# Patient Record
Sex: Female | Born: 1950
Health system: Southern US, Community
[De-identification: ages and names within clinical notes are randomized; demographics above are authoritative.]

## PROBLEM LIST (undated history)

## (undated) DIAGNOSIS — C50912 Malignant neoplasm of unspecified site of left female breast: Secondary | ICD-10-CM

## (undated) DIAGNOSIS — Z923 Personal history of irradiation: Secondary | ICD-10-CM

## (undated) DIAGNOSIS — I10 Essential (primary) hypertension: Secondary | ICD-10-CM

## (undated) HISTORY — PX: BREAST LUMPECTOMY: SHX2

## (undated) HISTORY — DX: Essential (primary) hypertension: I10

## (undated) HISTORY — PX: BLEPHAROPLASTY: SUR158

---

## 2016-04-22 DIAGNOSIS — Z23 Encounter for immunization: Secondary | ICD-10-CM | POA: Diagnosis not present

## 2016-10-10 DIAGNOSIS — H40033 Anatomical narrow angle, bilateral: Secondary | ICD-10-CM | POA: Diagnosis not present

## 2016-10-10 DIAGNOSIS — G44219 Episodic tension-type headache, not intractable: Secondary | ICD-10-CM | POA: Diagnosis not present

## 2016-12-23 DIAGNOSIS — Z23 Encounter for immunization: Secondary | ICD-10-CM | POA: Diagnosis not present

## 2017-09-18 DIAGNOSIS — C50912 Malignant neoplasm of unspecified site of left female breast: Secondary | ICD-10-CM

## 2017-09-18 HISTORY — DX: Malignant neoplasm of unspecified site of left female breast: C50.912

## 2017-10-04 ENCOUNTER — Other Ambulatory Visit: Payer: Self-pay

## 2017-10-04 ENCOUNTER — Encounter: Payer: Self-pay | Admitting: Family Medicine

## 2017-10-04 ENCOUNTER — Other Ambulatory Visit: Payer: Self-pay | Admitting: Family Medicine

## 2017-10-04 ENCOUNTER — Ambulatory Visit (INDEPENDENT_AMBULATORY_CARE_PROVIDER_SITE_OTHER): Payer: Medicare Other | Admitting: Family Medicine

## 2017-10-04 VITALS — BP 150/88 | HR 73 | Temp 98.7°F | Ht 62.5 in | Wt 127.0 lb

## 2017-10-04 DIAGNOSIS — N814 Uterovaginal prolapse, unspecified: Secondary | ICD-10-CM

## 2017-10-04 DIAGNOSIS — H938X3 Other specified disorders of ear, bilateral: Secondary | ICD-10-CM

## 2017-10-04 DIAGNOSIS — N6320 Unspecified lump in the left breast, unspecified quadrant: Secondary | ICD-10-CM | POA: Diagnosis present

## 2017-10-04 MED ORDER — FLUTICASONE PROPIONATE 50 MCG/ACT NA SUSP: 2.0000 | Freq: Every day | NASAL | 6 refills | Status: DC

## 2017-10-04 NOTE — Progress Notes (Signed)
   CC: new patient  HPI  Lump in L breast few months ago. Never had something similar. 15 years ago was last mammo. No nipple discharge, not painful, no change in the last few months since discovering it. No unintentional weight loss. She has "read online and is sure it is benign."  Concerned with possible prolapse of something into her vagina, nothing has come all the way out. One pregnancy TAB.  No hx of surgery or fibroids. Menopause really without symptoms. No urinary symptoms. Some fullness and discomfort and felt "off." not sexually active for 15 years.   Exercises 4-5x per week  Referred by: medicare list  Medical history: nothing, but hasn't seen a doctor in 15 years  Surgical history: cosmetic surgery - rhinoplasty and bleph  Social history:  Lives with: alone Occupation: retired, taking classes at [employer] Tobacco use: never Alcohol use: 2-3 times per month 1 drink Drug use: never  ROS: Denies CP, SOB, abdominal pain, dysuria, changes in BMs.   CC, SH/smoking status, and VS noted  Objective: BP (!) 150/88   Pulse 73   Temp 98.7 F (37.1 C) (Oral)   Ht 5' 2.5" (1.588 m)   Wt 127 lb (57.6 kg)   LMP 03/21/2002   SpO2 98%   BMI 22.86 kg/m  Gen: NAD, alert, cooperative, and pleasant. HEENT: NCAT, EOMI, PERRL CV: RRR, no murmur Chest: Left breast with 3 x 4 cm hard nonmobile mass no overlying skin changes.  No axillary lymphadenopathy or nipple discharge. Resp: CTAB, no wheezes, non-labored Abd: SNTND, BS present, no guarding or organomegaly GU: Normal external female genitalia, cervix easily visualized approximately 3 cm inside the introitus no lesions. Ext: No edema, warm Neuro: Alert and oriented, Speech clear, No gross deficits  Assessment and plan:  Breast mass, left Concerning lesion for malignancy given length of presence, solid mass on exam.  Will get diagnostic mammogram as soon as possible.  Patient calls to schedule this today.  Uterine  prolapse Cervix is easily visualized near the vaginal introitus, patient may likely benefit from pessary.  Will refer to gynecology for assessment.  Ear fullness, bilateral Will trial Flonase, can also try antihistamine over-the-counter persistent.   Orders Placed This Encounter  Procedures  . MM DIAG BREAST TOMO BILATERAL    MCR//CO SIGN REQ 10/04/17 PF IN [place] 15 YEARS AGO-PT UNABLE TO OBTAIN//L breast mass 4cm x3cm solid, present for months//NO NEEDS/NO BREAST SX'S/SB W/PT    Standing Status:   Future    Standing Expiration Date:   12/06/2018    Order Specific Question:   Reason for Exam (SYMPTOM  OR DIAGNOSIS REQUIRED)    Answer:   L breast mass 4cm x3cm solid, present for months    Order Specific Question:   Preferred imaging location?    Answer:   [facility]  . Ambulatory referral to Gynecology    Referral Priority:   Routine    Referral Type:   Consultation    Referral Reason:   Specialty Services Required    Requested Specialty:   Gynecology    Number of Visits Requested:   1    Meds ordered this encounter  Medications  . fluticasone (FLONASE) 50 MCG/ACT nasal spray    Sig: Place 2 sprays into both nostrils daily.    Dispense:  16 g    Refill:  6     Ralene Ok, MD, PGY3 10/05/2017 3:08 PM

## 2017-10-04 NOTE — Patient Instructions (Signed)
It was a pleasure to see you today! Thank you for choosing [facility] for your primary care. Tammy Levine was seen for new patient, breast lump.   Our plans for today were:  Please call the Breast center at [phone] to schedule your mammogram  The gynecology office will call you.   Please try the flonase.   Please come back in 1 month to recheck your blood pressure.  Best,  Dr. Lindell Noe

## 2017-10-05 DIAGNOSIS — N814 Uterovaginal prolapse, unspecified: Secondary | ICD-10-CM | POA: Insufficient documentation

## 2017-10-05 DIAGNOSIS — H938X3 Other specified disorders of ear, bilateral: Secondary | ICD-10-CM | POA: Insufficient documentation

## 2017-10-05 DIAGNOSIS — N6320 Unspecified lump in the left breast, unspecified quadrant: Secondary | ICD-10-CM | POA: Insufficient documentation

## 2017-10-05 NOTE — Assessment & Plan Note (Signed)
Concerning lesion for malignancy given length of presence, solid mass on exam.  Will get diagnostic mammogram as soon as possible.  Patient calls to schedule this today.

## 2017-10-05 NOTE — Assessment & Plan Note (Signed)
Cervix is easily visualized near the vaginal introitus, patient may likely benefit from pessary.  Will refer to gynecology for assessment.

## 2017-10-05 NOTE — Assessment & Plan Note (Signed)
Will trial Flonase, can also try antihistamine over-the-counter persistent.

## 2017-10-09 ENCOUNTER — Ambulatory Visit
Admission: RE | Admit: 2017-10-09 | Discharge: 2017-10-09 | Disposition: A | Discharge status: Home / Self Care | Payer: Medicare Other | Source: Ambulatory Visit | Attending: Family Medicine | Admitting: Family Medicine

## 2017-10-09 ENCOUNTER — Other Ambulatory Visit: Payer: Self-pay | Admitting: Family Medicine

## 2017-10-09 DIAGNOSIS — N6320 Unspecified lump in the left breast, unspecified quadrant: Secondary | ICD-10-CM

## 2017-10-09 DIAGNOSIS — R922 Inconclusive mammogram: Secondary | ICD-10-CM | POA: Diagnosis not present

## 2017-10-09 DIAGNOSIS — N6322 Unspecified lump in the left breast, upper inner quadrant: Secondary | ICD-10-CM | POA: Diagnosis not present

## 2017-10-10 ENCOUNTER — Other Ambulatory Visit: Payer: Self-pay | Admitting: Family Medicine

## 2017-10-11 ENCOUNTER — Ambulatory Visit
Admission: RE | Admit: 2017-10-11 | Discharge: 2017-10-11 | Disposition: A | Discharge status: Home / Self Care | Payer: Medicare Other | Source: Ambulatory Visit | Attending: Family Medicine | Admitting: Family Medicine

## 2017-10-11 DIAGNOSIS — N6322 Unspecified lump in the left breast, upper inner quadrant: Secondary | ICD-10-CM | POA: Diagnosis not present

## 2017-10-11 DIAGNOSIS — N6320 Unspecified lump in the left breast, unspecified quadrant: Secondary | ICD-10-CM

## 2017-10-11 DIAGNOSIS — C50212 Malignant neoplasm of upper-inner quadrant of left female breast: Secondary | ICD-10-CM | POA: Diagnosis not present

## 2017-10-13 ENCOUNTER — Telehealth: Payer: Self-pay | Admitting: Hematology

## 2017-10-13 ENCOUNTER — Encounter: Payer: Self-pay | Admitting: *Deleted

## 2017-10-13 NOTE — Telephone Encounter (Signed)
Spoke with patient to confirm afternoon [facility] appointment for 7/31, packet emailed to patient

## 2017-10-17 ENCOUNTER — Other Ambulatory Visit: Payer: Self-pay | Admitting: *Deleted

## 2017-10-17 DIAGNOSIS — Z170 Estrogen receptor positive status [ER+]: Principal | ICD-10-CM

## 2017-10-17 DIAGNOSIS — C50212 Malignant neoplasm of upper-inner quadrant of left female breast: Secondary | ICD-10-CM

## 2017-10-18 ENCOUNTER — Encounter: Payer: Self-pay | Admitting: Hematology

## 2017-10-18 ENCOUNTER — Ambulatory Visit
Admission: RE | Admit: 2017-10-18 | Discharge: 2017-10-18 | Disposition: A | Discharge status: Home / Self Care | Payer: Medicare Other | Source: Ambulatory Visit | Attending: Radiation Oncology | Admitting: Radiation Oncology

## 2017-10-18 ENCOUNTER — Inpatient Hospital Stay: Payer: Medicare Other

## 2017-10-18 ENCOUNTER — Other Ambulatory Visit: Payer: Self-pay

## 2017-10-18 ENCOUNTER — Encounter: Payer: Self-pay | Admitting: General Practice

## 2017-10-18 ENCOUNTER — Ambulatory Visit: Payer: Medicare Other | Attending: General Surgery | Admitting: Physical Therapy

## 2017-10-18 ENCOUNTER — Encounter: Payer: Self-pay | Admitting: Physical Therapy

## 2017-10-18 ENCOUNTER — Ambulatory Visit: Payer: Self-pay | Admitting: General Surgery

## 2017-10-18 ENCOUNTER — Inpatient Hospital Stay: Payer: Medicare Other | Attending: Hematology | Admitting: Hematology

## 2017-10-18 VITALS — BP 156/96 | HR 90 | Temp 97.8°F | Resp 18 | Ht 62.5 in | Wt 122.2 lb

## 2017-10-18 DIAGNOSIS — Z170 Estrogen receptor positive status [ER+]: Secondary | ICD-10-CM | POA: Insufficient documentation

## 2017-10-18 DIAGNOSIS — C50212 Malignant neoplasm of upper-inner quadrant of left female breast: Secondary | ICD-10-CM

## 2017-10-18 DIAGNOSIS — F419 Anxiety disorder, unspecified: Secondary | ICD-10-CM | POA: Insufficient documentation

## 2017-10-18 DIAGNOSIS — R293 Abnormal posture: Secondary | ICD-10-CM | POA: Insufficient documentation

## 2017-10-18 DIAGNOSIS — Z803 Family history of malignant neoplasm of breast: Secondary | ICD-10-CM

## 2017-10-18 LAB — CMP (CANCER CENTER ONLY)
ALT: 14 U/L (ref 0–44)
AST: 18 U/L (ref 15–41)
Albumin: 4.3 g/dL (ref 3.5–5.0)
Alkaline Phosphatase: 85 U/L (ref 38–126)
Anion gap: 10 (ref 5–15)
BUN: 28 mg/dL — AB (ref 8–23)
CHLORIDE: 106 mmol/L (ref 98–111)
CO2: 26 mmol/L (ref 22–32)
CREATININE: 0.9 mg/dL (ref 0.44–1.00)
Calcium: 9.5 mg/dL (ref 8.9–10.3)
GFR, Est AFR Am: >60 mL/min (ref >60)
GFR, Estimated: >60 mL/min (ref >60)
Glucose, Bld: 164 mg/dL — ABNORMAL HIGH (ref 70–99)
POTASSIUM: 3.8 mmol/L (ref 3.5–5.1)
SODIUM: 142 mmol/L (ref 135–145)
Total Bilirubin: 0.4 mg/dL (ref 0.3–1.2)
Total Protein: 7.7 g/dL (ref 6.5–8.1)

## 2017-10-18 LAB — CBC WITH DIFFERENTIAL (CANCER CENTER ONLY)
Basophils Absolute: 0.1 10*3/uL (ref 0.0–0.1)
Basophils Relative: 1 %
EOS ABS: 0.1 10*3/uL (ref 0.0–0.5)
Eosinophils Relative: 2 %
HCT: 42.8 % (ref 34.8–46.6)
HEMOGLOBIN: 13.7 g/dL (ref 11.6–15.9)
LYMPHS ABS: 1.7 10*3/uL (ref 0.9–3.3)
LYMPHS PCT: 23 %
MCH: 27 pg (ref 25.1–34.0)
MCHC: 32 g/dL (ref 31.5–36.0)
MCV: 84.2 fL (ref 79.5–101.0)
MONOS PCT: 5 %
Monocytes Absolute: 0.4 10*3/uL (ref 0.1–0.9)
NEUTROS PCT: 69 %
Neutro Abs: 5.2 10*3/uL (ref 1.5–6.5)
Platelet Count: 299 10*3/uL (ref 145–400)
RBC: 5.08 MIL/uL (ref 3.70–5.45)
RDW: 13.9 % (ref 11.2–14.5)
WBC Count: 7.5 10*3/uL (ref 3.9–10.3)

## 2017-10-18 NOTE — Progress Notes (Signed)
Nutrition Assessment  Reason for Assessment:  Pt seen in Breast Clinic  ASSESSMENT:   67 year old female with new diagnosis of breast cancer.  Past medial history reviewed.    Patient anxious and worried about diagnosis appetite has not been normal over the last few weeks but patient has still been eating  Medications:  reviewed  Labs: reviewed  Anthropometrics:   Height: 62.5 inches Weight: 122 lb  BMI: 21   NUTRITION DIAGNOSIS: Food and nutrition related knowledge deficit related to new diagnosis of breast cancer as evidenced by no prior need for nutrition related information.  INTERVENTION:   Discussed and provided packet of information regarding nutritional tips for breast cancer patients.  Questions answered.  Teachback method used.  Contact information provided and patient knows to contact me with questions/concerns.    MONITORING, EVALUATION, and GOAL: Pt will consume a healthy plant based diet to maintain lean body mass throughout treatment.   Tammy Levine B. Zenia Resides, [place], [place] Registered Dietitian [phone] (pager)

## 2017-10-18 NOTE — Progress Notes (Signed)
Tammy Levine  [phone] Fax:([phone]) [place] Note   Patient Care Team: Sela Hilding, MD as PCP - General (Family Medicine) Jovita Kussmaul, MD as Consulting Physician (General Surgery) Truitt Merle, MD as Consulting Physician (Hematology) Gery Pray, MD as Consulting Physician (Radiation Oncology)   Date of Service:  10/18/2017  CHIEF COMPLAINTS/PURPOSE OF CONSULTATION:  Malignant neoplasm of upper-inner quadrant of left breast     Malignant neoplasm of upper-inner quadrant of left breast in female, estrogen receptor positive ([place])   10/09/2017 Mammogram    Diagnostic Mammogram 10/09/17 IMPRESSION: 3.2 x 3.0 x 2.0 cm mass with imaging features highly suspicious for malignancy in the 11 o'clock position of the left breast.      10/11/2017 Initial Biopsy    Diagnosis 10/11/17 Breast, left, needle core biopsy, upper inner 11 o'clock - INVASIVE DUCTAL CARCINOMA WITH PAPILLARY FEATURES, SEE COMMENT.       10/11/2017 Receptors her2    Estrogen Receptor: 100%, POSITIVE, STRONG STAINING INTENSITY Progesterone Receptor: 80%, POSITIVE, STRONG STAINING INTENSITY Proliferation Marker Ki67: 15% HER2 - NEGATIVE      10/12/2017 Cancer Staging    Staging form: Breast, AJCC 8th Edition - Clinical stage from 10/12/2017: Stage IB (cT2, cN0, cM0, G2, ER+, PR+, HER2-) - Signed by Truitt Merle, MD on 10/18/2017      10/17/2017 Initial Diagnosis    Malignant neoplasm of upper-inner quadrant of left breast in female, estrogen receptor positive ([place])       HISTORY OF PRESENTING ILLNESS:   Tammy Levine 67 y.o. female is a here because of newly diagnosed left breast cancer. The patient presents to the Breast Clinic today accompanied by herself   She notes she has not had insurance for a long time and has not been doing regular mammogram screenings in the past. She notes she palpated her mass 3 months ago but was not sure if it was cancerous. She  went to her PCP on 10/04/17  and then had a mammogram before biopsy which determined the lump to be cancer. She notes breast sensitivity with mass. She notes she did not pay attention to any size change of her breast mass. She notes she would mildly check her breast regularly. She notes she freaked out when she noticed the lump herself.   Today she denies change in appetite or weight, she is at baseline overall. She is very active with exercise. She notes 1 day of breast pain post biopsy and she took aleve and Unisom to her sleep for this. She notes she is anxious about her diagnosis as she did not think she would get cancer.  Socially she does not have children. She is a retired Secondary school teacher. She moved to [place] 3 years ago. She takes philosophy classes at [employer] for her leisure.   She notes no significant PMHx. She had cosmetic surgery on her right eye. She notes to having a history of regular periods. Her menopause was tolerable. Her mother had a lumpectomy but not sure what was removed. She is not sure what cancer her father had. Both of her parents passed from renal failure. 2 of her maternal half-cousins had breast cancer. She attempted smoking cigarettes but never a habit. She stopped smoking marijuana in 1990. She does not do recreational drugs. She drinks occasionally. She notes she only takes Flonase as needed.    GYN HISTORY   Menarchal: 14 LMP: 2004 at 67yo Contraceptive: For 1 year at 22-24yo HRT:  No  G1P0A1: 1 abortion at age 33yo.     MEDICAL HISTORY:  History reviewed. No pertinent past medical history.  SURGICAL HISTORY: Past Surgical History:  Procedure Laterality Date  . cosmetic eyelid surgery      SOCIAL HISTORY: Social History   Socioeconomic History  . Marital status: Single    Spouse name: Not on file  . Number of children: Not on file  . Years of education: Not on file  . Highest education level: Not on file  Occupational History  . Not on  file  Social Needs  . Financial resource strain: Not on file  . Food insecurity:    Worry: Not on file    Inability: Not on file  . Transportation needs:    Medical: Not on file    Non-medical: Not on file  Tobacco Use  . Smoking status: Never Smoker  . Smokeless tobacco: Never Used  Substance and Sexual Activity  . Alcohol use: Yes    Comment: 2-3 times a months   . Drug use: Yes    Types: Marijuana    Comment: stopped in 1990   . Sexual activity: Not on file  Lifestyle  . Physical activity:    Days per week: Not on file    Minutes per session: Not on file  . Stress: Not on file  Relationships  . Social connections:    Talks on phone: Not on file    Gets together: Not on file    Attends religious service: Not on file    Active member of club or organization: Not on file    Attends meetings of clubs or organizations: Not on file    Relationship status: Not on file  . Intimate partner violence:    Fear of current or ex partner: Not on file    Emotionally abused: Not on file    Physically abused: Not on file    Forced sexual activity: Not on file  Other Topics Concern  . Not on file  Social History Narrative  . Not on file    FAMILY HISTORY: Family History  Problem Relation Age of Onset  . Cancer Mother        breast cancer?   . Chronic Renal Failure Mother   . Chronic Renal Failure Father   . Breast cancer Paternal Grandfather   . Breast cancer Cousin     ALLERGIES:  has No Known Allergies.  MEDICATIONS:  Current Outpatient Medications  Medication Sig Dispense Refill  . fluticasone (FLONASE) 50 MCG/ACT nasal spray Place 2 sprays into both nostrils daily. 16 g 6   No current facility-administered medications for this visit.     REVIEW OF SYSTEMS:   Constitutional: Denies fevers, chills or abnormal night sweats Eyes: Denies blurriness of vision, double vision or watery eyes Ears, nose, mouth, throat, and face: Denies mucositis or sore  throat Respiratory: Denies cough, dyspnea or wheezes Cardiovascular: Denies palpitation, chest discomfort or lower extremity swelling Gastrointestinal:  Denies nausea, heartburn or change in bowel habits Skin: Denies abnormal skin rashes Lymphatics: Denies new lymphadenopathy or easy bruising Neurological:Denies numbness, tingling or new weaknesses Behavioral/Psych: Mood is stable, no new changes  (+) anxious BREAST: (+) Palpable left breast mass with sensitivity All other systems were reviewed with the patient and are negative.  PHYSICAL EXAMINATION: ECOG PERFORMANCE STATUS: 0 - Asymptomatic  Vitals:   10/18/17 1301  BP: (!) 156/96  Pulse: 90  Resp: 18  Temp: 97.8 F (36.6 C)  SpO2: 99%   Filed Weights   10/18/17 1301  Weight: 122 lb 3.2 oz (55.4 kg)    GENERAL:alert, no distress and comfortable SKIN: skin color, texture, turgor are normal, no rashes or significant lesions EYES: normal, conjunctiva are pink and non-injected, sclera clear OROPHARYNX:no exudate, no erythema and lips, buccal mucosa, and tongue normal  NECK: supple, thyroid normal size, non-tender, without nodularity LYMPH:  no palpable lymphadenopathy in the cervical, axillary or inguinal LUNGS: clear to auscultation and percussion with normal breathing effort HEART: regular rate & rhythm and no murmurs and no lower extremity edema ABDOMEN:abdomen soft, non-tender and normal bowel sounds Musculoskeletal:no cyanosis of digits and no clubbing  PSYCH: alert & oriented x 3 with fluent speech NEURO: no focal motor/sensory deficits BREAST: (+) skin ecchymosis at biopsy site (+) 3.5 x 3 cm mass, nontender in left breast. Right breast exam benign, no other palpable mass or adenopathy.   LABORATORY DATA:  I have reviewed the data as listed CBC Latest Ref Rng & Units 10/18/2017  WBC 3.9 - 10.3 K/uL 7.5  Hemoglobin 11.6 - 15.9 g/dL 13.7  Hematocrit 34.8 - 46.6 % 42.8  Platelets 145 - 400 K/uL 299    CMP Latest  Ref Rng & Units 10/18/2017  Glucose 70 - 99 mg/dL 164(H)  BUN 8 - 23 mg/dL 28(H)  Creatinine 0.44 - 1.00 mg/dL 0.90  Sodium 135 - 145 mmol/L 142  Potassium 3.5 - 5.1 mmol/L 3.8  Chloride 98 - 111 mmol/L 106  CO2 22 - 32 mmol/L 26  Calcium 8.9 - 10.3 mg/dL 9.5  Total Protein 6.5 - 8.1 g/dL 7.7  Total Bilirubin 0.3 - 1.2 mg/dL 0.4  Alkaline Phos 38 - 126 U/L 85  AST 15 - 41 U/L 18  ALT 0 - 44 U/L 14    PATHOLOGY  Diagnosis 10/11/17 Breast, left, needle core biopsy, upper inner 11 o'clock - INVASIVE DUCTAL CARCINOMA WITH PAPILLARY FEATURES, SEE COMMENT. Microscopic Comment The carcinoma appears grade 2. Prognostic markers will be ordered. Dr. Lyndon Code has reviewed the case. The case was called to [facility] on 10/12/2017. Results: IMMUNOHISTOCHEMICAL AND MORPHOMETRIC ANALYSIS PERFORMED MANUALLY Estrogen Receptor: 100%, POSITIVE, STRONG STAINING INTENSITY Progesterone Receptor: 80%, POSITIVE, STRONG STAINING INTENSITY Proliferation Marker Ki67: 15% REFERENCE RANGE ESTROGEN RECEPTOR NEGATIVE 0% POSITIVE =>1% REFERENCE RANGE PROGESTERONE RECEPTOR NEGATIVE 0% POSITIVE =>1% All controls stained appropriately Thressa Sheller MD Pathologist, Electronic Signature ( Signed 10/17/2017) FLUORESCENCE IN-SITU HYBRIDIZATION Results: HER2 - NEGATIVE RATIO OF HER2/CEP17 SIGNALS 1.32 AVERAGE HER2 COPY NUMBER PER CELL 1.85     RADIOGRAPHIC STUDIES: I have personally reviewed the radiological images as listed and agreed with the findings in the report. US Breast Ltd Uni Left Inc Axilla  Result Date: 10/09/2017 CLINICAL DATA:  Mass felt by the patient in the upper left breast for the past 3 months. She reports no change in size during that time. Her last mammogram was 15 years ago and is not available for comparison. EXAM: DIGITAL DIAGNOSTIC BILATERAL MAMMOGRAM WITH CAD AND TOMO ULTRASOUND LEFT BREAST COMPARISON:  None. ACR Breast Density Category c: The breast tissue is  heterogeneously dense, which may obscure small masses. FINDINGS: There is an irregular, circumscribed mass in the upper, slightly inner left breast with some associated architectural distortion. This corresponds to the palpable mass, marked with a metallic marker and contains a small number of tiny, punctate, rounded and oval calcifications. No findings elsewhere in either breast suspicious for malignancy. No visible enlarged left axillary lymph  nodes. Mammographic images were processed with CAD. On physical exam, the patient has an approximately 4 x 3 cm firm, mobile, palpable mass in the 11 o'clock position of the left breast, 5 cm from the nipple. There are no palpable left axillary lymph nodes. Targeted ultrasound is performed, showing a 3.2 x 3.0 x 2.0 cm irregular hypoechoic mass ill-defined surrounding increased echogenicity in the 11 o'clock position of the left breast, 5 cm from the nipple. Ultrasound of the left axilla demonstrated normal appearing left axillary lymph nodes. IMPRESSION: 3.2 cm mass with imaging features highly suspicious for malignancy in the 11 o'clock position of the left breast. RECOMMENDATION: Ultrasound-guided core needle biopsy of the 3.2 cm mass in the 11 o'clock position of the left breast. This has been discussed with the patient and scheduled at 12:45 p.m. on 10/11/2017. I have discussed the findings and recommendations with the patient. Results were also provided in writing at the conclusion of the visit. If applicable, a reminder letter will be sent to the patient regarding the next appointment. BI-RADS CATEGORY  5: Highly suggestive of malignancy. Electronically Signed   By: Claudie Revering M.D.   On: 10/09/2017 12:41   Mm Diag Breast Tomo Bilateral  Result Date: 10/09/2017 CLINICAL DATA:  Mass felt by the patient in the upper left breast for the past 3 months. She reports no change in size during that time. Her last mammogram was 15 years ago and is not available for  comparison. EXAM: DIGITAL DIAGNOSTIC BILATERAL MAMMOGRAM WITH CAD AND TOMO ULTRASOUND LEFT BREAST COMPARISON:  None. ACR Breast Density Category c: The breast tissue is heterogeneously dense, which may obscure small masses. FINDINGS: There is an irregular, circumscribed mass in the upper, slightly inner left breast with some associated architectural distortion. This corresponds to the palpable mass, marked with a metallic marker and contains a small number of tiny, punctate, rounded and oval calcifications. No findings elsewhere in either breast suspicious for malignancy. No visible enlarged left axillary lymph nodes. Mammographic images were processed with CAD. On physical exam, the patient has an approximately 4 x 3 cm firm, mobile, palpable mass in the 11 o'clock position of the left breast, 5 cm from the nipple. There are no palpable left axillary lymph nodes. Targeted ultrasound is performed, showing a 3.2 x 3.0 x 2.0 cm irregular hypoechoic mass ill-defined surrounding increased echogenicity in the 11 o'clock position of the left breast, 5 cm from the nipple. Ultrasound of the left axilla demonstrated normal appearing left axillary lymph nodes. IMPRESSION: 3.2 cm mass with imaging features highly suspicious for malignancy in the 11 o'clock position of the left breast. RECOMMENDATION: Ultrasound-guided core needle biopsy of the 3.2 cm mass in the 11 o'clock position of the left breast. This has been discussed with the patient and scheduled at 12:45 p.m. on 10/11/2017. I have discussed the findings and recommendations with the patient. Results were also provided in writing at the conclusion of the visit. If applicable, a reminder letter will be sent to the patient regarding the next appointment. BI-RADS CATEGORY  5: Highly suggestive of malignancy. Electronically Signed   By: Claudie Revering M.D.   On: 10/09/2017 12:41   Mm Clip Placement Left  Result Date: 10/11/2017 CLINICAL DATA:  Evaluate clip placement  following ultrasound-guided LEFT breast biopsy. EXAM: DIAGNOSTIC LEFT MAMMOGRAM POST ULTRASOUND BIOPSY COMPARISON:  Previous exam(s). FINDINGS: Mammographic images were obtained following ultrasound guided biopsy of the 3.2 cm mass at the 11 o'clock position of the LEFT breast.  The RIBBON shaped clip is in satisfactory position. IMPRESSION: Satisfactory RIBBON clip position following ultrasound-guided LEFT breast biopsy. Final Assessment: Post Procedure Mammograms for Marker Placement Electronically Signed   By: Margarette Canada M.D.   On: 10/11/2017 14:01   [name] Lt Breast Bx W Loc Dev 1st Lesion Img Bx Spec US Guide  Addendum Date: 10/12/2017   ADDENDUM REPORT: 10/12/2017 14:35 ADDENDUM: Pathology revealed GRADE II INVASIVE DUCTAL CARCINOMA WITH PAPILLARY FEATURES of the Left breast, upper inner 11 o'clock. This was found to be concordant by Dr. Hassan Rowan. Pathology results were discussed with the patient by telephone. The patient reported doing well after the biopsy with tenderness at the site. Post biopsy instructions and care were reviewed and questions were answered. The patient was encouraged to call [facility] for any additional concerns. The patient was referred to [facility] on October 18, 2017. Pathology results reported by Terie Purser, RN on 10/12/2017. Electronically Signed   By: Margarette Canada M.D.   On: 10/12/2017 14:35   Result Date: 10/12/2017 CLINICAL DATA:  67 year old female for tissue sampling of UPPER INNER LEFT breast mass. EXAM: ULTRASOUND GUIDED LEFT BREAST CORE NEEDLE BIOPSY COMPARISON:  Previous exam(s). FINDINGS: I met with the patient and we discussed the procedure of ultrasound-guided biopsy, including benefits and alternatives. We discussed the high likelihood of a successful procedure. We discussed the risks of the procedure, including infection, bleeding, tissue injury, clip migration, and  inadequate sampling. Informed written consent was given. The usual time-out protocol was performed immediately prior to the procedure. Using sterile technique and 1% Lidocaine as local anesthetic, under direct ultrasound visualization, a 12 gauge spring-loaded device was used to perform biopsy of the 3.2 cm mass at the 11 o'clock position of the LEFT breast using a MEDIAL approach. At the conclusion of the procedure a RIBBON shaped tissue marker clip was deployed into the biopsy cavity. Follow up 2 view mammogram was performed and dictated separately. IMPRESSION: Ultrasound guided biopsy of 3.2 cm UPPER INNER LEFT breast mass. No apparent complications. Electronically Signed: By: Margarette Canada M.D. On: 10/11/2017 13:49    ASSESSMENT & PLAN:  Tammy Levine is a 67 y.o. Caucasian female with no significant PMHx.    1. Malignant neoplasm of upper-inner quadrant of left breast, Stage I, c (T2N0M0 ), ER/PR: (+), HER2 (-), Grade II -We discussed her image findings and the biopsy results in great details. She has grade II invasive ductal carcinoma.  -Giving the early stage disease, she is likely a candidate for lumpectomy with SLNB. She is agreeable with that. She was seen by Dr. Marlou Starks today and likely will proceed with surgery soon (in about 2 weeks).  -I recommend a Oncotype Dx test on the surgical sample and we'll make a decision about adjuvant chemotherapy based on the Oncotype result. Written material of this test was given to her. She is older but fit and in good overall health and would be a good candidate for chemotherapy if her Oncotype recurrence score is high. -If her surgical sentinel lymph node positive, I recommend mammaprint for further risk stratification and guide adjuvant chemotherapy. -The risk of recurrence depends on the stage and biology of the tumor. She is stage I, with ER/PR positive and HER2 negative markers. I discussed this is the more common type of slow growing tumor.  -She was also  seen by radiation oncologist Dr. Sondra Come today. If her surgical  sentinel lymph nodes were negative, she would not need post mastectomy radiation. Otherwise radiation is recommended to reduce the risk for local recurrence.  -Giving the strong ER and PR expression in her postmenopausal status, I recommend adjuvant endocrine therapy with aromatase inhibitor for a total of 5-10 years to reduce the risk of cancer recurrence.  Potential side effect of aromatase inhibitor was discussed with her in details. -We also discussed the breast cancer surveillance after her surgery. She will continue annual screening mammogram, self exam, and a routine office visit with lab and exam with [name]. -I encouraged her to maintain healthy diet and continue to exercise regularly.  -Given her anxiety with diagnosis, I offered her the chance to speak with our social workers. She declined at this time.   -Labs reviewed, CBC and CMP is WNL except glucose 164 and BUN at 28.     2. Elevated BP and hyperglycemia -Her BP has been elevated lately, at 156/96 today (10/18/17) with random BG at 164. -I suggest she check BP at home when she is more relaxed -I suggest her to f/u with PCP and test for HbA1c.    PLAN:  -she will proceed with lumpectomy and SLN by Dr. Marlou Starks in a few weeks -Oncotype on surgical sample, or mammaprint if node positive  -f/u after surgery or radiation based on Oncotype result    No orders of the defined types were placed in this encounter.   All questions were answered. The patient knows to call the clinic with any problems, questions or concerns. I spent 55 minutes counseling the patient face to face. The total time spent in the appointment was 60 minutes and more than 50% was on counseling.     Truitt Merle, MD 10/18/2017 6:23 PM  I, Tammy Levine, am acting as scribe for Truitt Merle, MD.   I have reviewed the above documentation for accuracy and completeness, and I agree with the above.

## 2017-10-18 NOTE — Progress Notes (Signed)
Radiation Oncology         [phone] ________________________________  Multidisciplinary Breast Oncology Clinic [facility]) Initial Outpatient Consultation  Name: Tammy Levine MRN: [id number]  Date: 10/18/2017  DOB: 1950/07/10  [id number], Curt Bears, MD  Jovita Kussmaul, MD   REFERRING PHYSICIAN: Autumn Messing III, MD  DIAGNOSIS: Stage cT2, cN0, cMo, Left Breast, UIQ, Invasive Ductal Carcinoma, ER (+), PR (+), HER2 (neg), grade 2  Cancer Staging Malignant neoplasm of upper-inner quadrant of left breast in female, estrogen receptor positive ([place]) Staging form: Breast, AJCC 8th Edition - Clinical stage from 10/12/2017: Stage IB (cT2, cN0, cM0, G2, ER+, PR+, HER2-) - Signed by Truitt Merle, MD on 10/18/2017    HISTORY OF PRESENT ILLNESS::Tammy Levine is a 67 y.o. female who is presenting to the office today for evaluation of her newly diagnosed breast cancer. She initially presented with a lump to her left breast x 3 months.   She underwent bilateral diagnostic mammography with tomography and left breast ultrasonography at [facility] on 10/09/2017 showing: 3.2 cm mass with imaging features highly suspicious for malignancy in the 11 o'clock position of the left breast.  Accordingly on 10/11/2017 she proceeded to biopsy of the left breast area in question. The pathology from this procedure showed: Breast, left, needle core biopsy, upper inner 11 o'clock with invasive ductal carcinoma with papillary features. Prognostic indicators significant for: estrogen receptor, 100% positive and progesterone receptor, 80% positive, both with strong staining intensity. Proliferation marker Ki67 at 15%. HER2 negative.  On review of systems, She denies any other symptoms.     Menarche: 67 years old Age at first live birth:  GP: GxP0 LMP: Patient's last menstrual period was 03/21/2002. Contraceptive: Yes, from 1975-1976 HRT: No   The patient was referred today for presentation in the  multidisciplinary conference.  Radiology studies and pathology slides were presented there for review and discussion of treatment options.  A consensus was discussed regarding potential next steps.  PREVIOUS RADIATION THERAPY: No  PAST MEDICAL HISTORY:  has no past medical history on file.    PAST SURGICAL HISTORY: Past Surgical History:  Procedure Laterality Date  . cosmetic eyelid surgery      FAMILY HISTORY: family history includes Breast cancer in her cousin and paternal grandfather; Cancer in her mother; Chronic Renal Failure in her father and mother.  SOCIAL HISTORY:  reports that she has never smoked. She has never used smokeless tobacco. She reports that she drinks alcohol. She reports that she has current or past drug history. Drug: Marijuana.  ALLERGIES: Patient has no known allergies.  MEDICATIONS:  Current Outpatient Medications  Medication Sig Dispense Refill  . fluticasone (FLONASE) 50 MCG/ACT nasal spray Place 2 sprays into both nostrils daily. 16 g 6   No current facility-administered medications for this encounter.     REVIEW OF SYSTEMS:  REVIEW OF SYSTEMS: A 10+ POINT REVIEW OF SYSTEMS WAS OBTAINED including neurology, dermatology, psychiatry, cardiac, respiratory, lymph, extremities, GI, GU, musculoskeletal, constitutional, reproductive, HEENT. All pertinent positives are noted in the HPI. All others are negative.   PHYSICAL EXAM:  Vitals with BMI 10/18/2017  Height 5' 2.5"  Weight 122 lbs 3 oz  BMI 77.11  Systolic 657  Diastolic 96  Pulse 90  Respirations 18    General: Alert and oriented, in no acute distress HEENT: Head is normocephalic. Extraocular movements are intact. Oropharynx is clear. Neck: Neck is supple, no palpable cervical or supraclavicular lymphadenopathy. Heart: Regular in rate and rhythm with no murmurs,  rubs, or gallops. Chest: Clear to auscultation bilaterally, with no rhonchi, wheezes, or rales. Abdomen: Soft, nontender, nondistended,  with no rigidity or guarding. Extremities: No cyanosis or edema. Lymphatics: see Neck Exam Skin: No concerning lesions. Musculoskeletal: symmetric strength and muscle tone throughout. Neurologic: Cranial nerves II through XII are grossly intact. No obvious focalities. Speech is fluent. Coordination is intact. Psychiatric: Judgment and insight are intact. Affect is appropriate. Breast: Right breast with no palpable mass, nipple discharge, or bleeding. Left breast with a significant amount of bruising in the UIQ. Dominant mass measuring approximately 3.5 x 3 cm. No skin involvement or chest wall involvement.     KPS = 100  100 - Normal; no complaints; no evidence of disease. 90   - Able to carry on normal activity; minor signs or symptoms of disease. 80   - Normal activity with effort; some signs or symptoms of disease. 70   - Cares for self; unable to carry on normal activity or to do active work. 60   - Requires occasional assistance, but is able to care for most of his personal needs. 50   - Requires considerable assistance and frequent medical care. 33   - Disabled; requires special care and assistance. 27   - Severely disabled; hospital admission is indicated although death not imminent. 55   - Very sick; hospital admission necessary; active supportive treatment necessary. 10   - Moribund; fatal processes progressing rapidly. 0     - Dead  Karnofsky DA, Abelmann [place], Craver LS and Burchenal [facility] [phone]) The use of the nitrogen mustards in the palliative treatment of carcinoma: with particular reference to bronchogenic carcinoma Cancer 1 634-56  LABORATORY DATA:  Lab Results  Component Value Date   WBC 7.5 10/18/2017   HGB 13.7 10/18/2017   HCT 42.8 10/18/2017   MCV 84.2 10/18/2017   PLT 299 10/18/2017   Lab Results  Component Value Date   NA 142 10/18/2017   K 3.8 10/18/2017   CL 106 10/18/2017   CO2 26 10/18/2017   Lab Results  Component Value Date   ALT 14 10/18/2017    AST 18 10/18/2017   ALKPHOS 85 10/18/2017   BILITOT 0.4 10/18/2017    RADIOGRAPHY: US Breast Ltd Uni Left Inc Axilla  Result Date: 10/09/2017 CLINICAL DATA:  Mass felt by the patient in the upper left breast for the past 3 months. She reports no change in size during that time. Her last mammogram was 15 years ago and is not available for comparison. EXAM: DIGITAL DIAGNOSTIC BILATERAL MAMMOGRAM WITH CAD AND TOMO ULTRASOUND LEFT BREAST COMPARISON:  None. ACR Breast Density Category c: The breast tissue is heterogeneously dense, which may obscure small masses. FINDINGS: There is an irregular, circumscribed mass in the upper, slightly inner left breast with some associated architectural distortion. This corresponds to the palpable mass, marked with a metallic marker and contains a small number of tiny, punctate, rounded and oval calcifications. No findings elsewhere in either breast suspicious for malignancy. No visible enlarged left axillary lymph nodes. Mammographic images were processed with CAD. On physical exam, the patient has an approximately 4 x 3 cm firm, mobile, palpable mass in the 11 o'clock position of the left breast, 5 cm from the nipple. There are no palpable left axillary lymph nodes. Targeted ultrasound is performed, showing a 3.2 x 3.0 x 2.0 cm irregular hypoechoic mass ill-defined surrounding increased echogenicity in the 11 o'clock position of the left breast, 5 cm from the nipple.  Ultrasound of the left axilla demonstrated normal appearing left axillary lymph nodes. IMPRESSION: 3.2 cm mass with imaging features highly suspicious for malignancy in the 11 o'clock position of the left breast. RECOMMENDATION: Ultrasound-guided core needle biopsy of the 3.2 cm mass in the 11 o'clock position of the left breast. This has been discussed with the patient and scheduled at 12:45 p.m. on 10/11/2017. I have discussed the findings and recommendations with the patient. Results were also provided in  writing at the conclusion of the visit. If applicable, a reminder letter will be sent to the patient regarding the next appointment. BI-RADS CATEGORY  5: Highly suggestive of malignancy. Electronically Signed   By: Claudie Revering M.D.   On: 10/09/2017 12:41   Mm Diag Breast Tomo Bilateral  Result Date: 10/09/2017 CLINICAL DATA:  Mass felt by the patient in the upper left breast for the past 3 months. She reports no change in size during that time. Her last mammogram was 15 years ago and is not available for comparison. EXAM: DIGITAL DIAGNOSTIC BILATERAL MAMMOGRAM WITH CAD AND TOMO ULTRASOUND LEFT BREAST COMPARISON:  None. ACR Breast Density Category c: The breast tissue is heterogeneously dense, which may obscure small masses. FINDINGS: There is an irregular, circumscribed mass in the upper, slightly inner left breast with some associated architectural distortion. This corresponds to the palpable mass, marked with a metallic marker and contains a small number of tiny, punctate, rounded and oval calcifications. No findings elsewhere in either breast suspicious for malignancy. No visible enlarged left axillary lymph nodes. Mammographic images were processed with CAD. On physical exam, the patient has an approximately 4 x 3 cm firm, mobile, palpable mass in the 11 o'clock position of the left breast, 5 cm from the nipple. There are no palpable left axillary lymph nodes. Targeted ultrasound is performed, showing a 3.2 x 3.0 x 2.0 cm irregular hypoechoic mass ill-defined surrounding increased echogenicity in the 11 o'clock position of the left breast, 5 cm from the nipple. Ultrasound of the left axilla demonstrated normal appearing left axillary lymph nodes. IMPRESSION: 3.2 cm mass with imaging features highly suspicious for malignancy in the 11 o'clock position of the left breast. RECOMMENDATION: Ultrasound-guided core needle biopsy of the 3.2 cm mass in the 11 o'clock position of the left breast. This has been  discussed with the patient and scheduled at 12:45 p.m. on 10/11/2017. I have discussed the findings and recommendations with the patient. Results were also provided in writing at the conclusion of the visit. If applicable, a reminder letter will be sent to the patient regarding the next appointment. BI-RADS CATEGORY  5: Highly suggestive of malignancy. Electronically Signed   By: Claudie Revering M.D.   On: 10/09/2017 12:41   Mm Clip Placement Left  Result Date: 10/11/2017 CLINICAL DATA:  Evaluate clip placement following ultrasound-guided LEFT breast biopsy. EXAM: DIAGNOSTIC LEFT MAMMOGRAM POST ULTRASOUND BIOPSY COMPARISON:  Previous exam(s). FINDINGS: Mammographic images were obtained following ultrasound guided biopsy of the 3.2 cm mass at the 11 o'clock position of the LEFT breast. The RIBBON shaped clip is in satisfactory position. IMPRESSION: Satisfactory RIBBON clip position following ultrasound-guided LEFT breast biopsy. Final Assessment: Post Procedure Mammograms for Marker Placement Electronically Signed   By: Margarette Canada M.D.   On: 10/11/2017 14:01   [name] Lt Breast Bx W Loc Dev 1st Lesion Img Bx Spec US Guide  Addendum Date: 10/12/2017   ADDENDUM REPORT: 10/12/2017 14:35 ADDENDUM: Pathology revealed GRADE II INVASIVE DUCTAL CARCINOMA WITH PAPILLARY FEATURES of  the Left breast, upper inner 11 o'clock. This was found to be concordant by Dr. Hassan Rowan. Pathology results were discussed with the patient by telephone. The patient reported doing well after the biopsy with tenderness at the site. Post biopsy instructions and care were reviewed and questions were answered. The patient was encouraged to call [facility] for any additional concerns. The patient was referred to [facility] at [facility] on October 18, 2017. Pathology results reported by Terie Purser, RN on 10/12/2017. Electronically Signed   By: Margarette Canada M.D.    On: 10/12/2017 14:35   Result Date: 10/12/2017 CLINICAL DATA:  67 year old female for tissue sampling of UPPER INNER LEFT breast mass. EXAM: ULTRASOUND GUIDED LEFT BREAST CORE NEEDLE BIOPSY COMPARISON:  Previous exam(s). FINDINGS: I met with the patient and we discussed the procedure of ultrasound-guided biopsy, including benefits and alternatives. We discussed the high likelihood of a successful procedure. We discussed the risks of the procedure, including infection, bleeding, tissue injury, clip migration, and inadequate sampling. Informed written consent was given. The usual time-out protocol was performed immediately prior to the procedure. Using sterile technique and 1% Lidocaine as local anesthetic, under direct ultrasound visualization, a 12 gauge spring-loaded device was used to perform biopsy of the 3.2 cm mass at the 11 o'clock position of the LEFT breast using a MEDIAL approach. At the conclusion of the procedure a RIBBON shaped tissue marker clip was deployed into the biopsy cavity. Follow up 2 view mammogram was performed and dictated separately. IMPRESSION: Ultrasound guided biopsy of 3.2 cm UPPER INNER LEFT breast mass. No apparent complications. Electronically Signed: By: Margarette Canada M.D. On: 10/11/2017 13:49      IMPRESSION: Stage cT2, cN0, cMo, Left Breast, UIQ, Invasive Ductal Carcinoma with papillary features, ER (+), PR (+), HER2 (neg), grade 2  Patient will be a good candidate for breast conservation. The patient has a large tumor in relation to their breast size, so Dr. Marlou Starks will make a decision on if she will be a candidate for lumpectomy with a good cosmetic results. Patient does wish to proceed with breast conserving surgery if this is possible, she would prefer to not have a mastectomy.   Today, I talked to the patient about the findings and work-up thus far. We discussed the natural history of breast cancer and general treatment, highlighting the role of radiotherapy in the  management. We discussed the available radiation techniques, and focused on the details of logistics and delivery. We reviewed the anticipated acute and late sequelae associated with radiation in this setting. The patient was encouraged to ask questions that I answered to the best of my ability.    PLAN:  1. Left lumpectomy and sentinel node biopsy pending Dr. Ethlyn Gallery evaluation 2. Oncotype Dx to determine the potential benefit of adjuvant chemotherapy.  3. Adjuvant radiation therapy as breast conservation treatment 4. Aromatase Inhibitor     ------------------------------------------------  Blair Promise, PhD, MD   This document serves as a record of services personally performed by Gery Pray, MD. It was created on his behalf by Steva Colder, a trained medical scribe. The creation of this record is based on the scribe's personal observations and the provider's statements to them. This document has been checked and approved by the attending provider.

## 2017-10-18 NOTE — Therapy (Signed)
[place], [place], [address] Phone: [phone]   Fax:  [phone]  Physical Therapy Evaluation  Patient Details  Name: Tammy Levine MRN: [id number] Date of Birth: [birth year] Referring Provider: Dr. Autumn Messing   Encounter Date: 10/18/2017  PT End of Session - 10/18/17 1600    Visit Number  1    Number of Visits  2    Date for PT Re-Evaluation  12/13/17    PT Start Time  3154    PT Stop Time  1440 Also saw pt from 1458-1510 for a total of 43 minutes    PT Time Calculation (min)  31 min    Activity Tolerance  Patient tolerated treatment well    Behavior During Therapy  [facility] for tasks assessed/performed       History reviewed. No pertinent past medical history.  Past Surgical History:  Procedure Laterality Date  . cosmetic eyelid surgery      There were no vitals filed for this visit.   Subjective Assessment - 10/18/17 1518    Subjective  Patient reports she is here today to be seen by her medical team for her newly diagnosed left breast cancer.    Pertinent History  Patient was diagnosed on 10/09/17 with left grade II invasive ductal carcinoma breast cancer. It measures 3.2 cm and is located in the upper inner quadrant. It is ER/PR positive and HER2 negative with a Ki67 of 15%. She has no other medical problems.    Patient Stated Goals  Reduce lymphedema risk and learn post op shoulder ROM HEP    Currently in Pain?  No/denies         [facility] PT Assessment - 10/18/17 0001      Assessment   Medical Diagnosis  Left breast cancer    Referring Provider  Dr. Autumn Messing    Onset Date/Surgical Date  10/09/17    Hand Dominance  Right    Prior Therapy  none      Precautions   Precautions  Other (comment)    Precaution Comments  active cancer      Restrictions   Weight Bearing Restrictions  No      Balance Screen   Has the patient fallen in the past 6 months  No    Has the patient had a  decrease in activity level because of a fear of falling?   No    Is the patient reluctant to leave their home because of a fear of falling?   No      Home Social worker  Private residence    Living Arrangements  Alone    Available Help at Discharge  Friend(s)      Prior Function   Level of Independence  Independent    Vocation  Retired    Biomedical scientist  Retired Environmental manager    Leisure  She goes to the gym 5x/week and does spinning, body pump, and a strength/cardio class      Cognition   Overall Cognitive Status  Within Functional Limits for tasks assessed      Posture/Postural Control   Posture/Postural Control  Postural limitations    Postural Limitations  Rounded Shoulders      ROM / Strength   AROM / PROM / Strength  AROM;Strength      AROM   AROM Assessment Site  Shoulder    Right/Left Shoulder  Right;Left    Right Shoulder Extension  45  Degrees    Right Shoulder Flexion  148 Degrees    Right Shoulder ABduction  169 Degrees    Right Shoulder Internal Rotation  48 Degrees    Right Shoulder External Rotation  87 Degrees    Left Shoulder Extension  48 Degrees    Left Shoulder Flexion  154 Degrees    Left Shoulder ABduction  171 Degrees    Left Shoulder Internal Rotation  54 Degrees    Left Shoulder External Rotation  90 Degrees      Strength   Overall Strength  Within functional limits for tasks performed        LYMPHEDEMA/ONCOLOGY QUESTIONNAIRE - 10/18/17 1524      Type   Cancer Type  Left breast cancer      Lymphedema Assessments   Lymphedema Assessments  Upper extremities      Right Upper Extremity Lymphedema   10 cm Proximal to Olecranon Process  27 cm    Olecranon Process  21.2 cm    10 cm Proximal to Ulnar Styloid Process  19.5 cm    Just Proximal to Ulnar Styloid Process  13.5 cm    Across Hand at [employer]  16.5 cm    At [place] of 2nd Digit  5.6 cm      Left Upper Extremity Lymphedema   10 cm Proximal to Olecranon Process   26.8 cm    Olecranon Process  22.5 cm    10 cm Proximal to Ulnar Styloid Process  18.6 cm    Just Proximal to Ulnar Styloid Process  13.3 cm    Across Hand at [employer]  16.1 cm    At [place] of 2nd Digit  5.3 cm             Objective measurements completed on examination: See above findings.     Patient was instructed today in a home exercise program today for post op shoulder range of motion. These included active assist shoulder flexion in sitting, scapular retraction, wall walking with shoulder abduction, and hands behind head external rotation.  She was encouraged to do these twice a day, holding 3 seconds and repeating 5 times when permitted by her physician.      PT Education - 10/18/17 1525    Education Details  Lymphedema risk reduction and post op shoulder ROM HEP    Person(s) Educated  Patient    Methods  Explanation;Demonstration;Handout    Comprehension  Verbalized understanding;Returned demonstration          PT Long Term Goals - 10/18/17 1607      PT LONG TERM GOAL #1   Title  Patient will demonstrate she has regained full function and shoulder ROM post operatively.    Time  [facility] Goals - 10/18/17 1605      Patient will be able to verbalize understanding of pertinent lymphedema risk reduction practices relevant to her diagnosis specifically related to skin care.   Time  1    Period  Days    Status  Achieved      Patient will be able to return demonstrate and/or verbalize understanding of the post-op home exercise program related to regaining shoulder range of motion.   Time  1    Period  Days    Status  Achieved      Patient will be able to verbalize understanding of  the importance of attending the postoperative After Breast Cancer Class for further lymphedema risk reduction education and therapeutic exercise.   Time  1    Period  Days    Status  Achieved            Plan - 10/18/17  1601    Clinical Impression Statement  Patient was diagnosed on 10/09/17 with left grade II invasive ductal carcinoma breast cancer. It measures 3.2 cm and is located in the upper inner quadrant. It is ER/PR positive and HER2 negative with a Ki67 of 15%. She has no other medical problems. Her multidisciplinary medical team met prior to her assessments to determine a recommended treatment plan. She is planning to have a left lumpectomy and sentinel node biopsy followed by Oncotype testing, radiation, and anti-estrogen therapy. She will benefit from post op PT to determine needs.    History and Personal Factors relevant to plan of care:  Lives alone    Clinical Presentation  Stable    Clinical Decision Making  Low    Rehab Potential  Excellent    Clinical Impairments Affecting Rehab Potential  None    PT Frequency  -- Eval and 1 f/u visit    PT Treatment/Interventions  ADLs/Self Care Home Management;Therapeutic exercise;Patient/family education    PT Next Visit Plan  Will reassess 3-4 weeks post op to determine PT needs    PT Home Exercise Plan  Post op shoulder ROM HEP    Consulted and Agree with Plan of Care  Patient       Patient will benefit from skilled therapeutic intervention in order to improve the following deficits and impairments:  Decreased range of motion, Impaired UE functional use, Pain, Decreased knowledge of precautions  Visit Diagnosis: Malignant neoplasm of upper-inner quadrant of left breast in female, estrogen receptor positive ([place]) - Plan: PT plan of care cert/re-cert  Abnormal posture - Plan: PT plan of care cert/re-cert   Patient will follow up at outpatient cancer rehab 3-4 weeks following surgery.  If the patient requires physical therapy at that time, a specific plan will be dictated and sent to the referring physician for approval. The patient was educated today on appropriate basic range of motion exercises to begin post operatively and the importance of attending  the After Breast Cancer class following surgery.  Patient was educated today on lymphedema risk reduction practices as it pertains to recommendations that will benefit the patient immediately following surgery.  She verbalized good understanding.      Problem List Patient Active Problem List   Diagnosis Date Noted  . Malignant neoplasm of upper-inner quadrant of left breast in female, estrogen receptor positive ([place]) 10/17/2017  . Breast mass, left 10/05/2017  . Uterine prolapse 10/05/2017  . Ear fullness, bilateral 10/05/2017    Annia Friendly, PT 10/18/17 4:11 PM  [place], [place], [address] Phone: [phone]   Fax:  [phone]  Name: Tammy Levine MRN: [id number] Date of Birth: March 31, 1950

## 2017-10-18 NOTE — Patient Instructions (Signed)

## 2017-10-19 ENCOUNTER — Telehealth: Payer: Self-pay | Admitting: Hematology

## 2017-10-19 NOTE — Progress Notes (Signed)
[facility] Psychosocial Distress Screening Spiritual Care  Met with Tammy Levine, who goes by "[name]," in [facility] to introduce [facility] team/resources, reviewing distress screen per protocol.  The patient scored a 6 on the Psychosocial Distress Thermometer which indicates moderate distress. Also assessed for distress and other psychosocial needs.   ONCBCN DISTRESS SCREENING 10/19/2017  Screening Type Initial Screening  Distress experienced in past week (1-10) 6  Emotional problem type Adjusting to illness  Spiritual/Religous concerns type Facing my mortality  Referral to support programs Yes    [name] is retired and chose to relocate from [place] to [place] ca 2-3 years ago for a slower pace of life and cleaner, less hectic city. A great joy for her has been auditing philosophy courses at [employer], a pastime that she recognizes as developing tools for reflecting on her new dx and how it may shape her life. Per pt, she is active, uses a gym regularly, and sees how her fitness may serve her well through treatment/recovery. She was very appreciative to learn about tai chi, yoga, and healing arts classes available through [facility].  Follow up needed: No. [name] has full packet of [facility] team/resources and knows to contact Team anytime, but please also page if immediate needs arise or circumstances change. Thank you.   Chaplain Lisa Lundeen, MDiv, [facility] Pager [phone] Voicemail [phone]      

## 2017-10-19 NOTE — Telephone Encounter (Signed)
No LOS 7/31

## 2017-10-20 ENCOUNTER — Telehealth: Payer: Self-pay | Admitting: *Deleted

## 2017-10-20 NOTE — Telephone Encounter (Signed)
Spoke with pt concerning Edgar from 10/18/17. Denies questions or concerns regarding dx or treatment care plan. Encourage pt to call with needs. Received verbal understanding.

## 2017-10-26 ENCOUNTER — Encounter (HOSPITAL_BASED_OUTPATIENT_CLINIC_OR_DEPARTMENT_OTHER): Payer: Self-pay | Admitting: *Deleted

## 2017-10-26 ENCOUNTER — Other Ambulatory Visit: Payer: Self-pay

## 2017-10-26 NOTE — Pre-Procedure Instructions (Signed)
To come pick up Ensure pre-surgery drink 10 oz. - to drink by 0545 DOS.  To pick up CHG soap to use in shower night before surgery and AM DOS.

## 2017-10-27 NOTE — Pre-Procedure Instructions (Signed)
Ensure pre-surgery drink 10 oz. given to pt. with instructions to drink by 0545 DOS; CHG soap 4% 4 oz. bottle given with instructions to use in shower night before surgery and AM DOS from neck down, excluding perineal area.  Pt. voiced understanding.

## 2017-11-01 ENCOUNTER — Ambulatory Visit (HOSPITAL_BASED_OUTPATIENT_CLINIC_OR_DEPARTMENT_OTHER): Payer: Medicare Other | Admitting: Anesthesiology

## 2017-11-01 ENCOUNTER — Ambulatory Visit (HOSPITAL_BASED_OUTPATIENT_CLINIC_OR_DEPARTMENT_OTHER)
Admission: RE | Admit: 2017-11-01 | Discharge: 2017-11-01 | Disposition: A | Discharge status: Home / Self Care | Payer: Medicare Other | Source: Ambulatory Visit | Attending: General Surgery | Admitting: General Surgery

## 2017-11-01 ENCOUNTER — Encounter (HOSPITAL_BASED_OUTPATIENT_CLINIC_OR_DEPARTMENT_OTHER)
Admission: RE | Disposition: A | Discharge status: Home / Self Care | Payer: Self-pay | Source: Ambulatory Visit | Attending: General Surgery

## 2017-11-01 ENCOUNTER — Other Ambulatory Visit: Payer: Self-pay

## 2017-11-01 ENCOUNTER — Encounter (HOSPITAL_BASED_OUTPATIENT_CLINIC_OR_DEPARTMENT_OTHER): Payer: Self-pay

## 2017-11-01 ENCOUNTER — Ambulatory Visit (HOSPITAL_COMMUNITY)
Admission: RE | Admit: 2017-11-01 | Discharge: 2017-11-01 | Disposition: A | Discharge status: Home / Self Care | Payer: Medicare Other | Source: Ambulatory Visit | Attending: General Surgery | Admitting: General Surgery

## 2017-11-01 DIAGNOSIS — C50212 Malignant neoplasm of upper-inner quadrant of left female breast: Secondary | ICD-10-CM | POA: Diagnosis not present

## 2017-11-01 DIAGNOSIS — C50912 Malignant neoplasm of unspecified site of left female breast: Secondary | ICD-10-CM | POA: Diagnosis not present

## 2017-11-01 DIAGNOSIS — Z791 Long term (current) use of non-steroidal anti-inflammatories (NSAID): Secondary | ICD-10-CM | POA: Diagnosis not present

## 2017-11-01 DIAGNOSIS — Z170 Estrogen receptor positive status [ER+]: Principal | ICD-10-CM

## 2017-11-01 DIAGNOSIS — Z7951 Long term (current) use of inhaled steroids: Secondary | ICD-10-CM | POA: Diagnosis not present

## 2017-11-01 DIAGNOSIS — G8918 Other acute postprocedural pain: Secondary | ICD-10-CM | POA: Diagnosis not present

## 2017-11-01 HISTORY — PX: BREAST LUMPECTOMY WITH AXILLARY LYMPH NODE BIOPSY: SHX5593

## 2017-11-01 HISTORY — DX: Malignant neoplasm of unspecified site of left female breast: C50.912

## 2017-11-01 SURGERY — BREAST LUMPECTOMY WITH AXILLARY LYMPH NODE BIOPSY
Anesthesia: Regional | Site: Breast | Laterality: Left

## 2017-11-01 MED ORDER — CHLORHEXIDINE GLUCONATE CLOTH 2 % EX PADS: 6.0000 | MEDICATED_PAD | Freq: Once | CUTANEOUS | Status: DC

## 2017-11-01 MED ORDER — PROPOFOL 10 MG/ML IV BOLUS
INTRAVENOUS | Status: DC | PRN
  Administered 2017-11-01: 120 mg via INTRAVENOUS

## 2017-11-01 MED ORDER — GABAPENTIN 300 MG PO CAPS
300.0000 mg | ORAL_CAPSULE | ORAL | Status: AC
  Administered 2017-11-01: 300 mg via ORAL

## 2017-11-01 MED ORDER — CELECOXIB 200 MG PO CAPS
ORAL_CAPSULE | ORAL | Status: AC
  Filled 2017-11-01: qty 1

## 2017-11-01 MED ORDER — LIDOCAINE HCL (CARDIAC) PF 100 MG/5ML IV SOSY
PREFILLED_SYRINGE | INTRAVENOUS | Status: DC | PRN
  Administered 2017-11-01: 30 mg via INTRAVENOUS

## 2017-11-01 MED ORDER — HYDROCODONE-ACETAMINOPHEN 5-325 MG PO TABS: 1.0000 | ORAL_TABLET | Freq: Four times a day (QID) | ORAL | 0 refills | Status: DC | PRN

## 2017-11-01 MED ORDER — FENTANYL CITRATE (PF) 100 MCG/2ML IJ SOLN
50.0000 ug | INTRAMUSCULAR | Status: DC | PRN
  Administered 2017-11-01 (×2): 50 ug via INTRAVENOUS

## 2017-11-01 MED ORDER — OXYCODONE HCL 5 MG/5ML PO SOLN: 5.0000 mg | Freq: Once | ORAL | Status: DC | PRN

## 2017-11-01 MED ORDER — HYDROMORPHONE HCL 1 MG/ML IJ SOLN: 0.2500 mg | INTRAMUSCULAR | Status: DC | PRN

## 2017-11-01 MED ORDER — LACTATED RINGERS IV SOLN
INTRAVENOUS | Status: DC
  Administered 2017-11-01 (×2): via INTRAVENOUS

## 2017-11-01 MED ORDER — ONDANSETRON HCL 4 MG/2ML IJ SOLN
INTRAMUSCULAR | Status: AC
  Filled 2017-11-01: qty 12

## 2017-11-01 MED ORDER — FENTANYL CITRATE (PF) 100 MCG/2ML IJ SOLN
INTRAMUSCULAR | Status: AC
  Filled 2017-11-01: qty 2

## 2017-11-01 MED ORDER — CEFAZOLIN SODIUM-DEXTROSE 2-4 GM/100ML-% IV SOLN
2.0000 g | INTRAVENOUS | Status: AC
  Administered 2017-11-01: 2 g via INTRAVENOUS

## 2017-11-01 MED ORDER — ONDANSETRON HCL 4 MG/2ML IJ SOLN
INTRAMUSCULAR | Status: DC | PRN
  Administered 2017-11-01: 4 mg via INTRAVENOUS

## 2017-11-01 MED ORDER — MIDAZOLAM HCL 2 MG/2ML IJ SOLN
INTRAMUSCULAR | Status: AC
  Filled 2017-11-01: qty 2

## 2017-11-01 MED ORDER — SCOPOLAMINE 1 MG/3DAYS TD PT72: 1.0000 | MEDICATED_PATCH | Freq: Once | TRANSDERMAL | Status: DC | PRN

## 2017-11-01 MED ORDER — MIDAZOLAM HCL 2 MG/2ML IJ SOLN
1.0000 mg | INTRAMUSCULAR | Status: DC | PRN
  Administered 2017-11-01: 2 mg via INTRAVENOUS

## 2017-11-01 MED ORDER — OXYCODONE HCL 5 MG PO TABS: 5.0000 mg | ORAL_TABLET | Freq: Once | ORAL | Status: DC | PRN

## 2017-11-01 MED ORDER — BUPIVACAINE HCL (PF) 0.25 % IJ SOLN
INTRAMUSCULAR | Status: DC | PRN
  Administered 2017-11-01: 15 mL
  Administered 2017-11-01: 10 mL
  Administered 2017-11-01: 5 mL

## 2017-11-01 MED ORDER — LIDOCAINE 2% (20 MG/ML) 5 ML SYRINGE
INTRAMUSCULAR | Status: AC
  Filled 2017-11-01: qty 15

## 2017-11-01 MED ORDER — PROPOFOL 500 MG/50ML IV EMUL
INTRAVENOUS | Status: AC
  Filled 2017-11-01: qty 50

## 2017-11-01 MED ORDER — ACETAMINOPHEN 500 MG PO TABS
1000.0000 mg | ORAL_TABLET | ORAL | Status: AC
  Administered 2017-11-01: 1000 mg via ORAL

## 2017-11-01 MED ORDER — ROPIVACAINE HCL 7.5 MG/ML IJ SOLN
INTRAMUSCULAR | Status: DC | PRN
  Administered 2017-11-01: 20 mL via PERINEURAL

## 2017-11-01 MED ORDER — DEXAMETHASONE SODIUM PHOSPHATE 10 MG/ML IJ SOLN
INTRAMUSCULAR | Status: AC
  Filled 2017-11-01: qty 3

## 2017-11-01 MED ORDER — DEXAMETHASONE SODIUM PHOSPHATE 4 MG/ML IJ SOLN
INTRAMUSCULAR | Status: DC | PRN
  Administered 2017-11-01: 10 mg via INTRAVENOUS

## 2017-11-01 MED ORDER — ACETAMINOPHEN 500 MG PO TABS
ORAL_TABLET | ORAL | Status: AC
  Filled 2017-11-01: qty 2

## 2017-11-01 MED ORDER — TECHNETIUM TC 99M SULFUR COLLOID FILTERED
1.0000 | Freq: Once | INTRAVENOUS | Status: AC | PRN
  Administered 2017-11-01: 1 via INTRADERMAL

## 2017-11-01 MED ORDER — CEFAZOLIN SODIUM-DEXTROSE 2-4 GM/100ML-% IV SOLN
INTRAVENOUS | Status: AC
  Filled 2017-11-01: qty 100

## 2017-11-01 MED ORDER — CELECOXIB 200 MG PO CAPS
200.0000 mg | ORAL_CAPSULE | ORAL | Status: AC
  Administered 2017-11-01: 200 mg via ORAL

## 2017-11-01 MED ORDER — GABAPENTIN 300 MG PO CAPS
ORAL_CAPSULE | ORAL | Status: AC
  Filled 2017-11-01: qty 1

## 2017-11-01 SURGICAL SUPPLY — 46 items
APPLIER CLIP 11 MED OPEN (CLIP) ×3
BLADE SURG 15 STRL LF DISP TIS (BLADE) ×1 IMPLANT
BLADE SURG 15 STRL SS (BLADE) ×2
CANISTER SUCT 1200ML W/VALVE (MISCELLANEOUS) ×3 IMPLANT
CHLORAPREP W/TINT 26ML (MISCELLANEOUS) ×3 IMPLANT
CLIP APPLIE 11 MED OPEN (CLIP) ×1 IMPLANT
COVER BACK TABLE 60X90IN (DRAPES) ×3 IMPLANT
COVER MAYO STAND STRL (DRAPES) ×3 IMPLANT
COVER PROBE W GEL 5X96 (DRAPES) ×3 IMPLANT
DECANTER SPIKE VIAL GLASS SM (MISCELLANEOUS) IMPLANT
DERMABOND ADVANCED (GAUZE/BANDAGES/DRESSINGS) ×2
DERMABOND ADVANCED .7 DNX12 (GAUZE/BANDAGES/DRESSINGS) ×1 IMPLANT
DEVICE DUBIN W/COMP PLATE 8390 (MISCELLANEOUS) ×3 IMPLANT
DRAPE LAPAROSCOPIC ABDOMINAL (DRAPES) ×3 IMPLANT
DRAPE UTILITY XL STRL (DRAPES) ×3 IMPLANT
ELECT COATED BLADE 2.86 ST (ELECTRODE) ×3 IMPLANT
ELECT REM PT RETURN 9FT ADLT (ELECTROSURGICAL) ×3
ELECTRODE REM PT RTRN 9FT ADLT (ELECTROSURGICAL) ×1 IMPLANT
GLOVE BIO SURGEON STRL SZ7 (GLOVE) ×3 IMPLANT
GLOVE BIO SURGEON STRL SZ7.5 (GLOVE) ×3 IMPLANT
GLOVE EXAM NITRILE MD LF STRL (GLOVE) ×3 IMPLANT
GOWN STRL REUS W/ TWL LRG LVL3 (GOWN DISPOSABLE) ×1 IMPLANT
GOWN STRL REUS W/ TWL XL LVL3 (GOWN DISPOSABLE) ×1 IMPLANT
GOWN STRL REUS W/TWL LRG LVL3 (GOWN DISPOSABLE) ×2
GOWN STRL REUS W/TWL XL LVL3 (GOWN DISPOSABLE) ×2
ILLUMINATOR WAVEGUIDE N/F (MISCELLANEOUS) IMPLANT
KIT MARKER MARGIN INK (KITS) ×3 IMPLANT
LIGHT WAVEGUIDE WIDE FLAT (MISCELLANEOUS) IMPLANT
NDL SAFETY ECLIPSE 18X1.5 (NEEDLE) ×1 IMPLANT
NEEDLE HYPO 18GX1.5 SHARP (NEEDLE) ×2
NEEDLE HYPO 25X1 1.5 SAFETY (NEEDLE) ×6 IMPLANT
NS IRRIG 1000ML POUR BTL (IV SOLUTION) ×3 IMPLANT
PACK BASIN DAY SURGERY FS (CUSTOM PROCEDURE TRAY) ×3 IMPLANT
PENCIL BUTTON HOLSTER BLD 10FT (ELECTRODE) ×3 IMPLANT
SLEEVE SCD COMPRESS KNEE MED (MISCELLANEOUS) ×3 IMPLANT
SPONGE LAP 18X18 RF (DISPOSABLE) ×3 IMPLANT
SUT ETHILON 3 0 FSL (SUTURE) IMPLANT
SUT MON AB 4-0 PC3 18 (SUTURE) ×6 IMPLANT
SUT SILK 3 0 PS 1 (SUTURE) IMPLANT
SUT VICRYL 3-0 CR8 SH (SUTURE) ×6 IMPLANT
SYR CONTROL 10ML LL (SYRINGE) ×6 IMPLANT
TOWEL GREEN STERILE FF (TOWEL DISPOSABLE) ×3 IMPLANT
TOWEL OR NON WOVEN STRL DISP B (DISPOSABLE) IMPLANT
TUBE CONNECTING 20'X1/4 (TUBING) ×1
TUBE CONNECTING 20X1/4 (TUBING) ×2 IMPLANT
YANKAUER SUCT BULB TIP NO VENT (SUCTIONS) ×3 IMPLANT

## 2017-11-01 NOTE — Progress Notes (Signed)
Assisted Barbara, nuc med tech, with nuc med injections. Side rails up, monitors on throughout procedure. See vital signs in flow sheet. Tolerated Procedure well. 

## 2017-11-01 NOTE — Anesthesia Postprocedure Evaluation (Signed)
Anesthesia Post Note  Patient: Raileigh Sabater  Procedure(s) Performed: BREAST LUMPECTOMY WITH SENTINEL LYMPH NODE MAPPING (Left Breast)     Patient location during evaluation: PACU Anesthesia Type: Regional and General Level of consciousness: awake and alert Pain management: pain level controlled Vital Signs Assessment: post-procedure vital signs reviewed and stable Respiratory status: spontaneous breathing, nonlabored ventilation, respiratory function stable and patient connected to nasal cannula oxygen Cardiovascular status: blood pressure returned to baseline and stable Postop Assessment: no apparent nausea or vomiting Anesthetic complications: no    Last Vitals:  Vitals:   11/01/17 1145 11/01/17 1251  BP: (!) 158/89 (!) 163/87  Pulse: 83 78  Resp: 17 16  Temp:  36.7 C  SpO2: 98% 97%    Last Pain:  Vitals:   11/01/17 1251  TempSrc: Oral  PainSc: 0-No pain                 Ryan P Ellender

## 2017-11-01 NOTE — Discharge Instructions (Signed)

## 2017-11-01 NOTE — Op Note (Signed)
11/01/2017  11:14 AM  PATIENT:  Tammy Levine  67 y.o. female  PRE-OPERATIVE DIAGNOSIS:  LEFT BREAST CANCER  POST-OPERATIVE DIAGNOSIS:  LEFT BREAST CANCER  PROCEDURE:  Procedure(s): BREAST LUMPECTOMY WITH DEEP LEFT AXILLARY SENTINEL LYMPH NODE MAPPING (Left)  SURGEON:  Surgeon(s) and Role:    * Jovita Kussmaul, MD - Primary  PHYSICIAN ASSISTANT:   ASSISTANTS: none   ANESTHESIA:   local and general  EBL:  minimal   BLOOD ADMINISTERED:none  DRAINS: none   LOCAL MEDICATIONS USED:  MARCAINE     SPECIMEN:  Source of Specimen:  left breast tissue and sentinel nodes X 2  DISPOSITION OF SPECIMEN:  PATHOLOGY  COUNTS:  YES  TOURNIQUET:  * No tourniquets in log *  DICTATION: .Dragon Dictation   After informed consent was obtained the patient was brought to the operating room and placed in the supine position on the operating table.  After adequate induction of general anesthesia the patient's left chest, breast, and axillary area were prepped with ChloraPrep, allowed to dry, and draped in usual sterile manner.  An appropriate timeout was performed.  Earlier in the day the patient underwent injection of 1 mCi of technetium sulfur colloid in the subareolar position on the left.  The neoprobe was set to technetium in an area of radioactivity was readily identified in the left axilla.  Attention was first turned to the axilla.  The area overlying the radioactivity was infiltrated with quarter percent Marcaine.  A small transversely oriented incision was made with a 15 blade knife.  The incision was carried through the skin and subcutaneous tissue sharply with electrocautery until the deep left axillary space was entered.  The neoprobe was used to direct blunt hemostat dissection.  I was able to identify a 2 hot lymph nodes.  These lymph nodes were excised sharply with the electrocautery and the lymphatics were controlled with clips.  Ex vivo counts on the sentinel nodes were 700 and 50  respectively.  These were sent as sentinel nodes numbers 1 and 2.  No other hot or palpable lymph nodes were identified in the left axilla.  The deep layer of the wound was then closed with interrupted 3-0 Vicryl stitches.  The skin was then closed with a running 4-0 Monocryl subcuticular stitch.  Attention was then turned to the left breast.  The patient had a large palpable mass in the upper portion of the left breast.  Because of the size of the mass we could not use hidden scar techniques.  An elliptical incision was made in the skin overlying the palpable mass with a 15 blade knife.  The incision was carried through the skin and subcutaneous tissue sharply with the electrocautery.  While palpating the mass the dissection was carried around the mass and all the way to the chest wall.  Once the entire specimen was removed then it was oriented with the appropriate paint colors.  A specimen radiograph was obtained that showed the mass and clip to be near the center of the specimen.  The specimen was then sent to pathology for further evaluation.  Hemostasis was achieved using the Bovie electrocautery.  The superior and inferior breast tissue was separated from the chest wall by sharp dissection with the electrocautery.  The cavity was marked with clips.  The wound was then infiltrated with more quarter percent Marcaine.  The deep layer of the wound was then closed with layers of interrupted 3-0 Vicryl stitches.  The skin was then  closed with a running 4-0 Monocryl subcuticular stitch.  Dermabond dressings were applied.  The patient tolerated the procedure well.  At the end of the case all needle sponge and instrument counts were correct.  The patient was then awakened and taken to recovery in stable condition.  PLAN OF CARE: Discharge to home after PACU  PATIENT DISPOSITION:  PACU - hemodynamically stable.   Delay start of Pharmacological VTE agent (>24hrs) due to surgical blood loss or risk of bleeding: not  applicable

## 2017-11-01 NOTE — H&P (Signed)
Tammy Levine  Location: [facility] Patient #: [id number] DOB: 24-Jul-1950 Undefined / Language: Cleophus Molt / Race: White Female   History of Present Illness  The patient is a 67 year old female who presents with breast cancer. We are asked to see the patient in consultation by Dr. Sondra Come to evaluate her for a new left breast cancer. The patient is a 67 year old white female who presents with a palpable mass in the upper inner quadrant of the left breast. It has been present for at least 3 months. It measured 3.2 cm by u/s. the axilla looked neg. It was biopsied and came back as grade 2 IDC that was ER and PR + and Her2- with a Ki67 of 5%. She denies smoking   Past Surgical History  No pertinent past surgical history   Diagnostic Studies History  Colonoscopy  1-5 years ago Mammogram  within last year Pap Smear  1-5 years ago  Medication History  Medications Reconciled  Social History Caffeine use  Coffee. No alcohol use  No drug use  Tobacco use  Never smoker.  Family History  Heart Disease  Father, Mother.  Pregnancy / Birth History  Age at menarche  77 years. Gravida  0 Para  0  Other Problems  No pertinent past medical history     Review of Systems General Not Present- Appetite Loss, Chills, Fatigue, Fever, Night Sweats, Weight Gain and Weight Loss. Skin Not Present- Change in Wart/Mole, Dryness, Hives, Jaundice, New Lesions, Non-Healing Wounds, Rash and Ulcer. HEENT Present- Wears glasses/contact lenses. Not Present- Earache, Hearing Loss, Hoarseness, Nose Bleed, Oral Ulcers, Ringing in the Ears, Seasonal Allergies, Sinus Pain, Sore Throat, Visual Disturbances and Yellow Eyes. Respiratory Not Present- Bloody sputum, Chronic Cough, Difficulty Breathing, Snoring and Wheezing. Breast Not Present- Breast Mass, Breast Pain, Nipple Discharge and Skin Changes. Cardiovascular Not Present- Chest Pain, Difficulty Breathing Lying Down, Leg Cramps,  Palpitations, Rapid Heart Rate, Shortness of Breath and Swelling of Extremities. Gastrointestinal Not Present- Abdominal Pain, Bloating, Bloody Stool, Change in Bowel Habits, Chronic diarrhea, Constipation, Difficulty Swallowing, Excessive gas, Gets full quickly at meals, Hemorrhoids, Indigestion, Nausea, Rectal Pain and Vomiting. Female Genitourinary Not Present- Frequency, Nocturia, Painful Urination, Pelvic Pain and Urgency. Musculoskeletal Not Present- Back Pain, Joint Pain, Joint Stiffness, Muscle Pain, Muscle Weakness and Swelling of Extremities. Neurological Not Present- Decreased Memory, Fainting, Headaches, Numbness, Seizures, Tingling, Tremor, Trouble walking and Weakness. Psychiatric Not Present- Anxiety, Bipolar, Change in Sleep Pattern, Depression, Fearful and Frequent crying. Endocrine Not Present- Cold Intolerance, Excessive Hunger, Hair Changes, Heat Intolerance, Hot flashes and New Diabetes. Hematology Not Present- Blood Thinners, Easy Bruising, Excessive bleeding, Gland problems, HIV and Persistent Infections.   Physical Exam  General Mental Status-Alert. General Appearance-Consistent with stated age. Hydration-Well hydrated. Voice-Normal.  Head and Neck Head-normocephalic, atraumatic with no lesions or palpable masses. Trachea-midline. Thyroid Gland Characteristics - normal size and consistency.  Eye Eyeball - Bilateral-Extraocular movements intact. Sclera/Conjunctiva - Bilateral-No scleral icterus.  Chest and Lung Exam Chest and lung exam reveals -quiet, even and easy respiratory effort with no use of accessory muscles and on auscultation, normal breath sounds, no adventitious sounds and normal vocal resonance. Inspection Chest Wall - Normal. Back - normal.  Breast Note: There is a 3cm palpable mass in the upper inner quadrant of the left breast. There is no palpable mass in the right breast. There is no palpable axillary, supraclavicular, or  cervical lymphadenopathy   Cardiovascular Cardiovascular examination reveals -normal heart sounds, regular rate  and rhythm with no murmurs and normal pedal pulses bilaterally.  Abdomen Inspection Inspection of the abdomen reveals - No Hernias. Skin - Scar - no surgical scars. Palpation/Percussion Palpation and Percussion of the abdomen reveal - Soft, Non Tender, No Rebound tenderness, No Rigidity (guarding) and No hepatosplenomegaly. Auscultation Auscultation of the abdomen reveals - Bowel sounds normal.  Neurologic Neurologic evaluation reveals -alert and oriented x 3 with no impairment of recent or remote memory. Mental Status-Normal.  Musculoskeletal Normal Exam - Left-Upper Extremity Strength Normal and Lower Extremity Strength Normal. Normal Exam - Right-Upper Extremity Strength Normal and Lower Extremity Strength Normal.  Lymphatic Head & Neck  General Head & Neck Lymphatics: Bilateral - Description - Normal. Axillary  General Axillary Region: Bilateral - Description - Normal. Tenderness - Non Tender. Femoral & Inguinal  Generalized Femoral & Inguinal Lymphatics: Bilateral - Description - Normal. Tenderness - Non Tender.    Assessment & Plan  MALIGNANT NEOPLASM OF UPPER-INNER QUADRANT OF LEFT BREAST IN FEMALE, ESTROGEN RECEPTOR POSITIVE (C50.212) Impression: The patient appears to have a 3.2 cm cancer in the UIQ of the left breast. I have talked to her in detail about the options for treatment and at this point she favors breast conservation. She is a good candidate for sentinel node mapping. I have discussed with her the risks and benefits of the surgery as well as some of the technical aspects and she understands and wishes to proceed. She will then follow up with med and rad onc for adjuvant therapy

## 2017-11-01 NOTE — Anesthesia Preprocedure Evaluation (Addendum)
Anesthesia Evaluation  Patient identified by MRN, date of birth, ID band Patient awake    Reviewed: Allergy & Precautions, NPO status , Patient's Chart, lab work & pertinent test results  Airway Mallampati: II  TM Distance: >3 FB Neck ROM: Full    Dental no notable dental hx.    Pulmonary neg pulmonary ROS,    Pulmonary exam normal breath sounds clear to auscultation       Cardiovascular negative cardio ROS Normal cardiovascular exam Rhythm:Regular Rate:Normal     Neuro/Psych negative neurological ROS  negative psych ROS   GI/Hepatic negative GI ROS, Neg liver ROS,   Endo/Other  negative endocrine ROS  Renal/GU negative Renal ROS     Musculoskeletal negative musculoskeletal ROS (+)   Abdominal   Peds  Hematology negative hematology ROS (+)   Anesthesia Other Findings LEFT BREAST CANCER  Reproductive/Obstetrics                            Anesthesia Physical Anesthesia Plan  ASA: II  Anesthesia Plan: General and Regional   Post-op Pain Management: GA combined w/ Regional for post-op pain   Induction: Intravenous  PONV Risk Score and Plan: 3 and Midazolam, Dexamethasone, Ondansetron and Treatment may vary due to age or medical condition  Airway Management Planned: LMA  Additional Equipment:   Intra-op Plan:   Post-operative Plan: Extubation in OR  Informed Consent: I have reviewed the patients History and Physical, chart, labs and discussed the procedure including the risks, benefits and alternatives for the proposed anesthesia with the patient or authorized representative who has indicated his/her understanding and acceptance.   Dental advisory given  Plan Discussed with: CRNA  Anesthesia Plan Comments:         Anesthesia Quick Evaluation

## 2017-11-01 NOTE — Progress Notes (Signed)
Assisted Dr. Ellender with left, ultrasound guided, pectoralis block. Side rails up, monitors on throughout procedure. See vital signs in flow sheet. Tolerated Procedure well. 

## 2017-11-01 NOTE — Interval H&P Note (Signed)
History and Physical Interval Note:  11/01/2017 9:36 AM  Tammy Levine  has presented today for surgery, with the diagnosis of LEFT BREAST CANCER  The various methods of treatment have been discussed with the patient and family. After consideration of risks, benefits and other options for treatment, the patient has consented to  Procedure(s): BREAST LUMPECTOMY WITH SENTINEL LYMPH NODE MAPPING (Left) as a surgical intervention .  The patient's history has been reviewed, patient examined, no change in status, stable for surgery.  I have reviewed the patient's chart and labs.  Questions were answered to the patient's satisfaction.     TOTH III,Ramie Erman S

## 2017-11-01 NOTE — Anesthesia Procedure Notes (Addendum)
Anesthesia Regional Block: Pectoralis block   Pre-Anesthetic Checklist: ,, timeout performed, Correct Patient, Correct Site, Correct Laterality, Correct Procedure,, site marked, risks and benefits discussed, Surgical consent,  Pre-op evaluation,  At surgeon's request and post-op pain management  Laterality: Left  Prep: chloraprep       Needles:  Injection technique: Single-shot  Needle Type: Echogenic Stimulator Needle     Needle Length: 9cm  Needle Gauge: 21     Additional Needles:   Procedures:,,,, ultrasound used (permanent image in chart),,,,  Narrative:  Start time: 11/01/2017 9:10 AM End time: 11/01/2017 9:20 AM Injection made incrementally with aspirations every 5 mL.  Performed by: Personally  Anesthesiologist: Murvin Natal, MD  Additional Notes: Functioning IV was confirmed and monitors were applied.  A 60mm 21ga Arrow echogenic stimulator needle was used. Sterile prep, hand hygiene and sterile gloves were used.  Negative aspiration and negative test dose prior to incremental administration of local anesthetic. The patient tolerated the procedure well.

## 2017-11-01 NOTE — Transfer of Care (Signed)
Immediate Anesthesia Transfer of Care Note  Patient: Tammy Levine  Procedure(s) Performed: BREAST LUMPECTOMY WITH SENTINEL LYMPH NODE MAPPING (Left Breast)  Patient Location: PACU  Anesthesia Type:GA combined with regional for post-op pain  Level of Consciousness: awake and patient cooperative  Airway & Oxygen Therapy: Patient Spontanous Breathing and Patient connected to face mask oxygen  Post-op Assessment: Report given to RN and Post -op Vital signs reviewed and stable  Post vital signs: Reviewed and stable  Last Vitals:  Vitals Value Taken Time  BP    Temp    Pulse 69 11/01/2017 11:20 AM  Resp 11 11/01/2017 11:20 AM  SpO2 100 % 11/01/2017 11:20 AM  Vitals shown include unvalidated device data.  Last Pain:  Vitals:   11/01/17 0805  TempSrc: Oral  PainSc: 0-No pain         Complications: No apparent anesthesia complications

## 2017-11-01 NOTE — Anesthesia Procedure Notes (Signed)
Procedure Name: LMA Insertion Date/Time: 11/01/2017 9:55 AM Performed by: Signe Colt, CRNA Pre-anesthesia Checklist: Patient identified, Emergency Drugs available, Suction available and Patient being monitored Patient Re-evaluated:Patient Re-evaluated prior to induction Oxygen Delivery Method: Circle system utilized Preoxygenation: Pre-oxygenation with 100% oxygen Induction Type: IV induction Ventilation: Mask ventilation without difficulty LMA: LMA inserted LMA Size: 4.0 Number of attempts: 1 Airway Equipment and Method: Bite block Placement Confirmation: positive ETCO2 Tube secured with: Tape Dental Injury: Teeth and Oropharynx as per pre-operative assessment

## 2017-11-02 ENCOUNTER — Encounter (HOSPITAL_BASED_OUTPATIENT_CLINIC_OR_DEPARTMENT_OTHER): Payer: Self-pay | Admitting: General Surgery

## 2017-11-06 ENCOUNTER — Telehealth: Payer: Self-pay | Admitting: *Deleted

## 2017-11-06 NOTE — Telephone Encounter (Signed)
Ordered oncotype per Dr. Burr Medico.  Requisition faxed to pathology and confirmed receipt.

## 2017-11-09 ENCOUNTER — Encounter: Payer: Self-pay | Admitting: Family Medicine

## 2017-11-10 ENCOUNTER — Ambulatory Visit: Payer: Medicare Other | Admitting: Family Medicine

## 2017-11-12 NOTE — Addendum Note (Signed)
Addendum  created 11/12/17 2042 by Murvin Natal, MD   Intraprocedure Blocks edited, Sign clinical note

## 2017-11-14 ENCOUNTER — Encounter (HOSPITAL_COMMUNITY): Payer: Self-pay | Admitting: Hematology

## 2017-11-15 ENCOUNTER — Ambulatory Visit: Payer: Medicare Other | Attending: General Surgery | Admitting: Physical Therapy

## 2017-11-15 DIAGNOSIS — R293 Abnormal posture: Secondary | ICD-10-CM | POA: Insufficient documentation

## 2017-11-15 DIAGNOSIS — Z483 Aftercare following surgery for neoplasm: Secondary | ICD-10-CM | POA: Diagnosis not present

## 2017-11-15 DIAGNOSIS — M25612 Stiffness of left shoulder, not elsewhere classified: Secondary | ICD-10-CM | POA: Diagnosis not present

## 2017-11-15 NOTE — Patient Instructions (Signed)

## 2017-11-15 NOTE — Therapy (Signed)
[place] [address] [place], [place], [address] Phone: [phone]   Fax:  [phone]  Physical Therapy Re-evaluation  Patient Details  Name: Tammy Levine MRN: [id number] Date of Birth: 1950-04-24 Referring Provider: Dr. Autumn Messing    Encounter Date: 11/15/2017  PT End of Session - 11/15/17 1622    Visit Number  1    Number of Visits  7    Date for PT Re-Evaluation  12/13/17    PT Start Time  1607    PT Stop Time  1600    PT Time Calculation (min)  45 min    Activity Tolerance  Patient tolerated treatment well    Behavior During Therapy  [facility] for tasks assessed/performed       Past Medical History:  Diagnosis Date  . Breast cancer, left ([place]) 09/2017    Past Surgical History:  Procedure Laterality Date  . BLEPHAROPLASTY     lower eyelid  . BREAST LUMPECTOMY WITH AXILLARY LYMPH NODE BIOPSY Left 11/01/2017   Procedure: BREAST LUMPECTOMY WITH SENTINEL LYMPH NODE MAPPING;  Surgeon: Jovita Kussmaul, MD;  Location: [place];  Service: General;  Laterality: Left;    There were no vitals filed for this visit.  Subjective Assessment - 11/15/17 1516    Subjective  "I wouldn't say its pain, its discomfort"  Pt states she has been doing  exercises for her shoulder and using her arm at home     Pertinent History  Patient was diagnosed on 10/09/17 with left grade II invasive ductal carcinoma breast cancer. It measures 3.2 cm and is located in the upper inner quadrant. It is ER/PR positive and HER2 negative with a Ki67 of 15%. She has no other medical problems.  On 11/01/2017 she underwent a left breast lumpectomy with 2 deep nodes sentinel lymph nodes removed     Patient Stated Goals  get rid of cording in axilla as soon as possible          [facility] PT Assessment - 11/15/17 0001      Assessment   Medical Diagnosis  Left breast cancer    Referring Provider  Dr. Autumn Messing     Onset Date/Surgical Date   10/09/17    Hand Dominance  Right    Prior Therapy  none      Precautions   Precautions  Other (comment)    Precaution Comments  --      Restrictions   Weight Bearing Restrictions  No      Home Environment   Living Environment  Private residence    Living Arrangements  Alone    Available Help at Discharge  Friend(s)      Prior Function   Level of Independence  Independent    Vocation  Retired    Biomedical scientist  Retired Environmental manager    Leisure  She goes to the gym 5x/week and does spinning, body pump, and a strength/cardio class wants to get back to gym eventually       Cognition   Overall Cognitive Status  Within Functional Limits for tasks assessed   pt behavior indicated having a hard time accepting "cancer"   Behaviors  --   pt reluctant to look at surgery site in mirror,      Observation/Other Assessments   Observations  pt has several fine guitar string cords in left axilla that are painful to stretch.  No thick fullness around scar     Skin Integrity  skin glue on axillary scar and scar across top of breast     Quick DASH   2.27      Sensation   Additional Comments  pt reports some numbness in left upper arm       Coordination   Gross Motor Movements are Fluid and Coordinated  Yes      Posture/Postural Control   Posture/Postural Control  Postural limitations    Postural Limitations  Rounded Shoulders      ROM / Strength   AROM / PROM / Strength  AROM;Strength      AROM   Overall AROM Comments  AROM limited by pain to stretch in left shoulder      Right Shoulder Extension  --    Right Shoulder Flexion  --    Right Shoulder ABduction  --    Right Shoulder Internal Rotation  --    Right Shoulder External Rotation  --    Left Shoulder Extension  --    Left Shoulder Flexion  135 Degrees    Left Shoulder ABduction  144 Degrees    Left Shoulder Internal Rotation  --    Left Shoulder External Rotation  90 Degrees      Strength   Overall Strength  Within  functional limits for tasks performed      Palpation   Palpation comment  good scar mobility . painful  guitar strings visible and plapable in left axilla with tissue stretch         LYMPHEDEMA/ONCOLOGY QUESTIONNAIRE - 11/15/17 1526      Right Upper Extremity Lymphedema   10 cm Proximal to Olecranon Process  28 cm    Olecranon Process  21.3 cm    10 cm Proximal to Ulnar Styloid Process  19.5 cm    Just Proximal to Ulnar Styloid Process  13.5 cm    Across Hand at [employer]  16.5 cm    At [place] of 2nd Digit  5.4 cm      Left Upper Extremity Lymphedema   10 cm Proximal to Olecranon Process  27.1 cm    Olecranon Process  22 cm    10 cm Proximal to Ulnar Styloid Process  18.8 cm    Just Proximal to Ulnar Styloid Process  13.5 cm    Across Hand at [employer]  16.2 cm    At [place] of 2nd Digit  5.3 cm        Quick Dash - 11/15/17 0001    Open a tight or new jar  No difficulty    Do heavy household chores (wash walls, wash floors)  No difficulty    Carry a shopping bag or briefcase  No difficulty    Wash your back  No difficulty    Use a knife to cut food  No difficulty    Recreational activities in which you take some force or impact through your arm, shoulder, or hand (golf, hammering, tennis)  No difficulty    During the past week, to what extent has your arm, shoulder or hand problem interfered with your normal social activities with family, friends, neighbors, or groups?  Not at all    During the past week, to what extent has your arm, shoulder or hand problem limited your work or other regular daily activities  Not at all    Arm, shoulder, or hand pain.  None    Tingling (pins and needles) in your arm, shoulder, or hand  Mild  Difficulty Sleeping  No difficulty    DASH Score  2.27 %             [facility] Adult PT Treatment/Exercise - 11/15/17 0001      Self-Care   Self-Care  Other Self-Care Comments    Other Self-Care Comments   encouraged pt to place small  pillow into axilla for support       Shoulder Exercises: Supine   Other Supine Exercises  supine dowel rod flexion       Shoulder Exercises: ROM/Strengthening   Other ROM/Strengthening Exercises  wall stretch into abduction       Manual Therapy   Manual Therapy  Myofascial release    Myofascial Release  stretching to cords in left axilla              PT Education - 11/15/17 1621    Education Details  dowel rod exercises and stretching for left shoulder     Person(s) Educated  Patient    Methods  Explanation;Demonstration;Handout    Comprehension  Verbalized understanding;Returned demonstration          PT Long Term Goals - 11/15/17 1632      PT LONG TERM GOAL #1   Title  Patient will demonstrate she has regained full function and shoulder ROM post operatively.    Time  4    Period  Weeks    Status  New      PT LONG TERM GOAL #2   Title  Pt report that discomfort from axillary cording has decreased y 75%    Time  4    Period  Weeks    Status  New      PT LONG TERM GOAL #3   Title  Pt will verbalize understanding of lymphedema risk reduction precautions     Time  4    Period  Weeks    Status  New            Plan - 11/15/17 1623    Clinical Impression Statement  Ms Blethen comes to PT 2 weeks after surgery for lyumpectomy with  2 nodes removed.  She is doing very well post op except that she has cording in left axilla with painful decrease in shoulder ROM  REevaluation performed and treatment started Pt will benefit from more sessions of PT to help reduce symptoms, increase shoulder ROM and instruct in lymphedema risk reduction     PT Treatment/Interventions  ADLs/Self Care Home Management;Therapeutic exercise;Patient/family education;Passive range of motion;Compression bandaging;Scar mobilization;Manual techniques;Manual lymph drainage    PT Next Visit Plan  manual techniques to decrease axillary cording, progress home exercise, sign up for ABC class      Consulted and Agree with Plan of Care  Patient       Patient will benefit from skilled therapeutic intervention in order to improve the following deficits and impairments:  Decreased range of motion, Impaired UE functional use, Pain, Decreased knowledge of precautions, Decreased knowledge of use of DME, Decreased skin integrity, Increased fascial restricitons, Postural dysfunction, Impaired flexibility  Visit Diagnosis: Aftercare following surgery for neoplasm - Plan: PT plan of care cert/re-cert  Stiffness of left shoulder joint - Plan: PT plan of care cert/re-cert  Abnormal posture - Plan: PT plan of care cert/re-cert     Problem List Patient Active Problem List   Diagnosis Date Noted  . Malignant neoplasm of upper-inner quadrant of left breast in female, estrogen receptor positive ([place]) 10/17/2017  . Breast mass, left  10/05/2017  . Uterine prolapse 10/05/2017  . Ear fullness, bilateral 10/05/2017   Donato Heinz. Owens Shark PT  Norwood Levo 11/15/2017, 4:36 PM  [place], [place], [address] Phone: [phone]   Fax:  [phone]  Name: Tammy Levine MRN: [id number] Date of Birth: 29-Jan-1951

## 2017-11-21 ENCOUNTER — Ambulatory Visit: Payer: Medicare Other | Attending: General Surgery

## 2017-11-21 ENCOUNTER — Telehealth: Payer: Self-pay | Admitting: *Deleted

## 2017-11-21 DIAGNOSIS — M25612 Stiffness of left shoulder, not elsewhere classified: Secondary | ICD-10-CM

## 2017-11-21 DIAGNOSIS — C50212 Malignant neoplasm of upper-inner quadrant of left female breast: Secondary | ICD-10-CM | POA: Insufficient documentation

## 2017-11-21 DIAGNOSIS — Z483 Aftercare following surgery for neoplasm: Secondary | ICD-10-CM | POA: Diagnosis not present

## 2017-11-21 DIAGNOSIS — Z170 Estrogen receptor positive status [ER+]: Principal | ICD-10-CM

## 2017-11-21 DIAGNOSIS — R293 Abnormal posture: Secondary | ICD-10-CM | POA: Insufficient documentation

## 2017-11-21 NOTE — Telephone Encounter (Signed)
Received oncotype results of 2/3%.  Referral placed for Dr. Sondra Come. Patient is aware.

## 2017-11-21 NOTE — Therapy (Signed)
[place], [place], [address] Phone: [phone]   Fax:  [phone]  Physical Therapy Treatment  Patient Details  Name: Tammy Levine MRN: [id number] Date of Birth: 10-Jul-1950 Referring Provider: Dr. Autumn Messing    Encounter Date: 11/21/2017  PT End of Session - 11/21/17 1427    Visit Number  2    Number of Visits  7    Date for PT Re-Evaluation  12/13/17    PT Start Time  1351    PT Stop Time  1439    PT Time Calculation (min)  48 min    Activity Tolerance  Patient tolerated treatment well    Behavior During Therapy  [facility] for tasks assessed/performed       Past Medical History:  Diagnosis Date  . Breast cancer, left ([place]) 09/2017    Past Surgical History:  Procedure Laterality Date  . BLEPHAROPLASTY     lower eyelid  . BREAST LUMPECTOMY WITH AXILLARY LYMPH NODE BIOPSY Left 11/01/2017   Procedure: BREAST LUMPECTOMY WITH SENTINEL LYMPH NODE MAPPING;  Surgeon: Jovita Kussmaul, MD;  Location: [place];  Service: General;  Laterality: Left;    There were no vitals filed for this visit.  Subjective Assessment - 11/21/17 1358    Subjective  I feel so much better than last week. I've been doing the stretches and I can now touch my Lt upper arm  as the sensitivity is much improved. I also found out today that I won't be needing chemo, so I'm very happy abou that!    Pertinent History  Patient was diagnosed on 10/09/17 with left grade II invasive ductal carcinoma breast cancer. It measures 3.2 cm and is located in the upper inner quadrant. It is ER/PR positive and HER2 negative with a Ki67 of 15%. She has no other medical problems.  On 11/01/2017 she underwent a left breast lumpectomy with 2 deep nodes sentinel lymph nodes removed     Patient Stated Goals  get rid of cording in axilla as soon as possible     Currently in Pain?  No/denies                       [facility] Adult PT  Treatment/Exercise - 11/21/17 0001      Shoulder Exercises: Pulleys   Flexion  2 minutes    Flexion Limitations  Pt returned therapist demonstration and then further VCs to decresae Lt scapular compensation    ABduction  2 minutes    ABduction Limitations  Pt returned therapist demonstration      Shoulder Exercises: Therapy Ball   Flexion  Both;10 reps   Forward lean into end of stretch   ABduction  Left;5 reps   Same side lean into end of stretch     Manual Therapy   Manual Therapy  Myofascial release;Passive ROM    Myofascial Release  To Lt axilla at areas of cording    Passive ROM  In Supine to Lt shoulder into flexion, abduction, er and D2; also into radiation positioning                  PT Long Term Goals - 11/15/17 1632      PT LONG TERM GOAL #1   Title  Patient will demonstrate she has regained full function and shoulder ROM post operatively.    Time  4    Period  Weeks    Status  New      PT LONG TERM GOAL #2   Title  Pt report that discomfort from axillary cording has decreased y 75%    Time  4    Period  Weeks    Status  New      PT LONG TERM GOAL #3   Title  Pt will verbalize understanding of lymphedema risk reduction precautions     Time  4    Period  Weeks    Status  New            Plan - 11/21/17 1428    Clinical Impression Statement  Pt comes in already feeling better from last week and reports being able to touch her upper arm now as well. Cording is still very present but, per pt report, not as intense or quite as limiting. She tolerated manual therapy and AA/ROM very well today with even more increased in end ROM noted by pt and therapist at end of session. Encouraged her to cont HEP stretching at home.     Rehab Potential  Excellent    Clinical Impairments Affecting Rehab Potential  None    PT Frequency  2x / week    PT Duration  4 weeks    PT Treatment/Interventions  ADLs/Self Care Home Management;Therapeutic  exercise;Patient/family education;Passive range of motion;Compression bandaging;Scar mobilization;Manual techniques;Manual lymph drainage    PT Next Visit Plan  Cont manual techniques to decrease axillary cording, progress home exercise, sign up for ABC class     Consulted and Agree with Plan of Care  Patient       Patient will benefit from skilled therapeutic intervention in order to improve the following deficits and impairments:  Decreased range of motion, Impaired UE functional use, Pain, Decreased knowledge of precautions, Decreased knowledge of use of DME, Decreased skin integrity, Increased fascial restricitons, Postural dysfunction, Impaired flexibility  Visit Diagnosis: Aftercare following surgery for neoplasm  Stiffness of left shoulder joint  Abnormal posture  Malignant neoplasm of upper-inner quadrant of left breast in female, estrogen receptor positive ([place])     Problem List Patient Active Problem List   Diagnosis Date Noted  . Malignant neoplasm of upper-inner quadrant of left breast in female, estrogen receptor positive ([place]) 10/17/2017  . Breast mass, left 10/05/2017  . Uterine prolapse 10/05/2017  . Ear fullness, bilateral 10/05/2017    Otelia Limes, PTA 11/21/2017, 2:41 PM  [place], [place], [address] Phone: [phone]   Fax:  [phone]  Name: Zeriah Baysinger MRN: [id number] Date of Birth: 09-15-1950

## 2017-11-22 NOTE — Progress Notes (Signed)
Location of Breast Cancer: upper-inner quadrant of left breast   Histology per Pathology Report: 11/01/17:  1. Lymph node, sentinel, biopsy, Left - NO CARCINOMA IDENTIFIED IN ONE LYMPH NODE (0/1) 2. Lymph node, sentinel, biopsy, Left - NO CARCINOMA IDENTIFIED IN ONE LYMPH NODE (0/1) 3. Lymph node, sentinel, biopsy, Left - NO CARCINOMA IDENTIFIED IN ONE LYMPH NODE (0/1) 4. Breast, lumpectomy, Left - INVASIVE MAMMARY CARCINOMA, WITH PAPILLARY AND MICROPAPILLARY FEATURES, NOTTINGHAM GRADE 3 OF 3, 3.1 CM - MARGINS UNINVOLVED BY CARCINOMA (0.2 CM; POSTERIOR MARGIN)  Receptor Status: ER(100%), PR (80%), Her2-neu (negative), Ki-(15%)  Did patient present with symptoms (if so, please note symptoms) or was this found on screening mammography?: Pt palpated lump in left breast. Per PCP note 10/04/17:  Lump in L breast few months ago. Never had something similar. 15 years ago was last mammo. No nipple discharge, not painful, no change in the last few months since discovering it. No unintentional weight loss. She has "read online and is sure it is benign."  Past/Anticipated interventions by surgeon, if any: 11/01/17:PROCEDURE:  Procedure(s): BREAST LUMPECTOMY WITH DEEP LEFT AXILLARY SENTINEL LYMPH NODE MAPPING (Left)  SURGEON:  Surgeon(s) and Role:    Jovita Kussmaul, MD - Primary    Past/Anticipated interventions by medical oncology, if any: Chemotherapy Per Dr. Burr Medico 10/18/17:  Malignant neoplasm of upper-inner quadrant of left breast, Stage I, c (T2N0M0 ), ER/PR: (+), HER2 (-), Grade II -We discussed her image findings and the biopsy results in great details. She has grade II invasive ductal carcinoma.  -Giving the early stage disease, she is likely a candidate for lumpectomy with SLNB. She is agreeable with that. She was seen by Dr. Marlou Starks today and likely will proceed with surgery soon (in about 2 weeks).  -I recommend a Oncotype Dx test on the surgical sample and we'll make a decision about adjuvant  chemotherapy based on the Oncotype result. Written material of this test was given to her. She is older but fit and in good overall health and would be a good candidate for chemotherapy if her Oncotype recurrence score is high. -If her surgical sentinel lymph node positive, I recommend mammaprint for further risk stratification and guide adjuvant chemotherapy. -The risk of recurrence depends on the stage and biology of the tumor. She is stage I, with ER/PR positive and HER2 negative markers. I discussed this is the more common type of slow growing tumor.  -She was also seen by radiation oncologist Dr. Sondra Come today. If her surgical sentinel lymph nodes were negative, she would not need post mastectomy radiation. Otherwise radiation is recommended to reduce the risk for local recurrence.  -Giving the strong ER and PR expression in her postmenopausal status, I recommend adjuvant endocrine therapy with aromatase inhibitor for a total of 5-10 years to reduce the risk of cancer recurrence.  Potential side effect of aromatase inhibitor was discussed with her in details. -We also discussed the breast cancer surveillance after her surgery. She will continue annual screening mammogram, self exam, and a routine office visit with lab and exam with [name]. -I encouraged her to maintain healthy diet and continue to exercise regularly.  -Given her anxiety with diagnosis, I offered her the chance to speak with our social workers. She declined at this time.    Lymphedema issues, if any:  Pt states that she does not "believe so" R/T going to PT.  Receiving PT:  Per note 11/21/17:   Patient will benefit from skilled therapeutic intervention in  order to improve the following deficits and impairments:  Decreased range of motion, Impaired UE functional use, Pain, Decreased knowledge of precautions, Decreased knowledge of use of DME, Decreased skin integrity, Increased fascial restricitons, Postural dysfunction, Impaired  flexibility  Pain issues, if any:  Pt denies c/o pain. Pt does state she has occasional "discomfort" in breast.  SAFETY ISSUES:  Prior radiation? No  Pacemaker/ICD? No  Possible current pregnancy? No, pt is post-menopausal  Is the patient on methotrexate? No  Current Complaints / other details:  Pt presents today for consult with Dr. Sondra Come for Radiation Oncology. Pt is unaccompanied. Pt denies c/o pain. Pt is attending PT regularly. Pt scored "1/2"/10 on distress screening. This RN scored pt a "1" on scale.    Loma Sousa, RN 11/29/2017,3:44 PM

## 2017-11-23 ENCOUNTER — Encounter: Payer: Self-pay | Admitting: Physical Therapy

## 2017-11-23 ENCOUNTER — Ambulatory Visit: Payer: Medicare Other | Admitting: Physical Therapy

## 2017-11-23 DIAGNOSIS — R293 Abnormal posture: Secondary | ICD-10-CM | POA: Diagnosis not present

## 2017-11-23 DIAGNOSIS — C50212 Malignant neoplasm of upper-inner quadrant of left female breast: Secondary | ICD-10-CM | POA: Diagnosis not present

## 2017-11-23 DIAGNOSIS — Z483 Aftercare following surgery for neoplasm: Secondary | ICD-10-CM | POA: Diagnosis not present

## 2017-11-23 DIAGNOSIS — M25612 Stiffness of left shoulder, not elsewhere classified: Secondary | ICD-10-CM | POA: Diagnosis not present

## 2017-11-23 DIAGNOSIS — Z170 Estrogen receptor positive status [ER+]: Secondary | ICD-10-CM | POA: Diagnosis not present

## 2017-11-23 NOTE — Therapy (Signed)
[place], [place], [address] Phone: [phone]   Fax:  [phone]  Physical Therapy Treatment  Patient Details  Name: Tammy Levine MRN: [id number] Date of Birth: 1950-06-25 Referring Provider: Dr. Autumn Messing    Encounter Date: 11/23/2017  PT End of Session - 11/23/17 1926    Visit Number  3    Number of Visits  7    Date for PT Re-Evaluation  12/13/17    PT Start Time  1300    PT Stop Time  1345    PT Time Calculation (min)  45 min    Activity Tolerance  Patient tolerated treatment well    Behavior During Therapy  [facility] for tasks assessed/performed       Past Medical History:  Diagnosis Date  . Breast cancer, left ([place]) 09/2017    Past Surgical History:  Procedure Laterality Date  . BLEPHAROPLASTY     lower eyelid  . BREAST LUMPECTOMY WITH AXILLARY LYMPH NODE BIOPSY Left 11/01/2017   Procedure: BREAST LUMPECTOMY WITH SENTINEL LYMPH NODE MAPPING;  Surgeon: Jovita Kussmaul, MD;  Location: [place];  Service: General;  Laterality: Left;    There were no vitals filed for this visit.  Subjective Assessment - 11/23/17 1302    Subjective  Pt states she does not need chemo. She will see Dr. Sondra Come next wednesday. She feels that she is getting better all the time. She feels the pulling down into her upper arm but not into her breast     Pertinent History  Patient was diagnosed on 10/09/17 with left grade II invasive ductal carcinoma breast cancer. It measures 3.2 cm and is located in the upper inner quadrant. It is ER/PR positive and HER2 negative with a Ki67 of 15%. She has no other medical problems.  On 11/01/2017 she underwent a left breast lumpectomy with 2 deep nodes sentinel lymph nodes removed     Patient Stated Goals  get rid of cording in axilla as soon as possible     Currently in Pain?  No/denies                       [facility] Adult PT Treatment/Exercise -  11/23/17 0001      Self-Care   Self-Care  Other Self-Care Comments    Other Self-Care Comments   provided pt with small tg soft with a deep fold at the top and a piece of white foam to wear inside bra at fullness at axillary incision to supprt lymphatic system and help reduce cording      Shoulder Exercises: Supine   Protraction  AROM;Left;10 reps    Horizontal ABduction  AROM;10 reps    Flexion  AROM;Left;10 reps    Diagonals  AROM;Left;10 reps      Manual Therapy   Manual Therapy  Myofascial release    Myofascial Release  to left axilla at coding, no release felt                   PT Long Term Goals - 11/15/17 1632      PT LONG TERM GOAL #1   Title  Patient will demonstrate she has regained full function and shoulder ROM post operatively.    Time  4    Period  Weeks    Status  New      PT LONG TERM GOAL #2   Title  Pt report that discomfort from axillary  cording has decreased y 75%    Time  4    Period  Weeks    Status  New      PT LONG TERM GOAL #3   Title  Pt will verbalize understanding of lymphedema risk reduction precautions     Time  4    Period  Weeks    Status  New            Plan - 11/23/17 1927    Clinical Impression Statement  Pt appears to be making steady improvment, but continues to have guitar string cording at left axilla that did not release with manual technique. She also has palpable fullness at  axillary incision Provided temporary compression to these areas today and insucted pt to monitor for symptomatic improvmment and wear it as tolerated     Rehab Potential  Excellent    Clinical Impairments Affecting Rehab Potential  None    PT Frequency  2x / week    PT Next Visit Plan   assess effect of tg soft and foam patch Cont manual techniques to decrease axillary cording, progress home exercise, sign up for ABC class  Teach Strength ABC     Consulted and Agree with Plan of Care  Patient       Patient will benefit from skilled  therapeutic intervention in order to improve the following deficits and impairments:  Decreased range of motion, Impaired UE functional use, Pain, Decreased knowledge of precautions, Decreased knowledge of use of DME, Decreased skin integrity, Increased fascial restricitons, Postural dysfunction, Impaired flexibility  Visit Diagnosis: Aftercare following surgery for neoplasm  Stiffness of left shoulder joint  Abnormal posture     Problem List Patient Active Problem List   Diagnosis Date Noted  . Malignant neoplasm of upper-inner quadrant of left breast in female, estrogen receptor positive ([place]) 10/17/2017  . Breast mass, left 10/05/2017  . Uterine prolapse 10/05/2017  . Ear fullness, bilateral 10/05/2017   Donato Heinz. Owens Shark PT  Norwood Levo 11/23/2017, 7:30 PM  [place], [place], [address] Phone: [phone]   Fax:  [phone]  Name: Tammy Levine MRN: [id number] Date of Birth: 1950/12/13

## 2017-11-24 ENCOUNTER — Other Ambulatory Visit: Payer: Self-pay

## 2017-11-24 ENCOUNTER — Ambulatory Visit (INDEPENDENT_AMBULATORY_CARE_PROVIDER_SITE_OTHER): Payer: Medicare Other | Admitting: Family Medicine

## 2017-11-24 ENCOUNTER — Encounter: Payer: Self-pay | Admitting: Family Medicine

## 2017-11-24 VITALS — BP 154/90 | HR 65 | Temp 98.1°F | Ht 63.0 in | Wt 123.2 lb

## 2017-11-24 DIAGNOSIS — I10 Essential (primary) hypertension: Secondary | ICD-10-CM

## 2017-11-24 DIAGNOSIS — Z23 Encounter for immunization: Secondary | ICD-10-CM | POA: Diagnosis not present

## 2017-11-24 DIAGNOSIS — H938X3 Other specified disorders of ear, bilateral: Secondary | ICD-10-CM

## 2017-11-24 DIAGNOSIS — Z1159 Encounter for screening for other viral diseases: Secondary | ICD-10-CM

## 2017-11-24 DIAGNOSIS — R739 Hyperglycemia, unspecified: Secondary | ICD-10-CM | POA: Diagnosis not present

## 2017-11-24 LAB — POCT GLYCOSYLATED HEMOGLOBIN (HGB A1C): Hemoglobin A1C: 5.5 % (ref 4.0–5.6)

## 2017-11-24 MED ORDER — LOSARTAN POTASSIUM 25 MG PO TABS: 25.0000 mg | ORAL_TABLET | Freq: Every day | ORAL | 0 refills | Status: DC

## 2017-11-24 MED ORDER — TETANUS-DIPHTH-ACELL PERTUSSIS 5-2.5-18.5 LF-MCG/0.5 IM SUSP: 0.5000 mL | Freq: Once | INTRAMUSCULAR | 0 refills | Status: AC

## 2017-11-24 MED ORDER — CETIRIZINE HCL 10 MG PO TABS: 10.0000 mg | ORAL_TABLET | Freq: Every day | ORAL | 11 refills | Status: DC

## 2017-11-24 NOTE — Patient Instructions (Addendum)
It was a pleasure to see you today! Thank you for choosing [facility] for your primary care. Tammy Levine was seen for blood pressure, blood sugar.   Our plans for today were:  We will get labs today and I will let you know how the results look.   Please keep your gyn appointment.   Come back in 1 week and recheck your blood pressure and labs.   Come back and see me in 1 month.   Start the blood pressure medicine that I sent.   If the zyrtec makes no difference, we will send you to ENT.   Pick up a blood pressure cuff from the pharmacy.    Best,  Dr. Lindell Noe

## 2017-11-24 NOTE — Assessment & Plan Note (Signed)
Trial antihistamine, if no change refer to ENT.

## 2017-11-24 NOTE — Progress Notes (Signed)
   CC: HTN, elevated BG  HPI  HTN - never been on meds before. Amenable to medicine. Willing to get a cuff.   Elevated blood sugar - CBG was 164. No fam hx of dm, never had DM before.   Wants to know if she can have shingles vaccine.   Still has ear fullness despite flonase, preventing her from singing some notes.   ROS: Denies CP, SOB, abdominal pain, dysuria, changes in BMs.   CC, SH/smoking status, and VS noted  Objective: BP (!) 154/90   Pulse 65   Temp 98.1 F (36.7 C) (Oral)   Ht 5\' 3"  (1.6 m)   Wt 123 lb 3.2 oz (55.9 kg)   LMP 03/21/2002   SpO2 98%   BMI 21.82 kg/m  Gen: NAD, alert, cooperative, and pleasant. HEENT: NCAT, EOMI, PERRL CV: RRR, no murmur Resp: CTAB, no wheezes, non-labored Ext: No edema, warm Neuro: Alert and oriented, Speech clear, No gross deficits  Assessment and plan:  Ear fullness, bilateral Trial antihistamine, if no change refer to ENT.  Essential hypertension Start losartan today. Lab appt in 1 week and RN appt to recheck BP. Will get home cuff. F/u with me in 1 month.   Elevated blood sugar a1c 5.5 today, reassured patient.    Orders Placed This Encounter  Procedures  . Flu Vaccine QUAD 36+ mos IM  . Pneumococcal conjugate vaccine 13-valent IM  . Lipid panel  . Basic metabolic panel  . Hepatitis C antibody  . Basic metabolic panel    Standing Status:   Future    Standing Expiration Date:   11/25/2018  . POCT glycosylated hemoglobin (Hb A1C)    Meds ordered this encounter  Medications  . Tdap (BOOSTRIX) 5-2.5-18.5 LF-MCG/0.5 injection    Sig: Inject 0.5 mLs into the muscle once for 1 dose.    Dispense:  0.5 mL    Refill:  0  . cetirizine (ZYRTEC) 10 MG tablet    Sig: Take 1 tablet (10 mg total) by mouth daily.    Dispense:  30 tablet    Refill:  11  . losartan (COZAAR) 25 MG tablet    Sig: Take 1 tablet (25 mg total) by mouth daily.    Dispense:  90 tablet    Refill:  0    Health Maintenance reviewed - PNA, flu  given. Given rx for TDAP at pharmacy.   Ralene Ok, MD, PGY3 11/24/2017 12:14 PM

## 2017-11-24 NOTE — Assessment & Plan Note (Addendum)
Start losartan today. Lab appt in 1 week and RN appt to recheck BP. Will get home cuff. F/u with me in 1 month.

## 2017-11-24 NOTE — Assessment & Plan Note (Signed)
a1c 5.5 today, reassured patient.

## 2017-11-25 LAB — BASIC METABOLIC PANEL
BUN/Creatinine Ratio: 26 (ref 12–28)
BUN: 19 mg/dL (ref 8–27)
CO2: 24 mmol/L (ref 20–29)
Calcium: 9.5 mg/dL (ref 8.7–10.3)
Chloride: 104 mmol/L (ref 96–106)
Creatinine, Ser: 0.72 mg/dL (ref 0.57–1.00)
GFR calc Af Amer: 101 mL/min/{1.73_m2} (ref >59)
GFR calc non Af Amer: 88 mL/min/{1.73_m2} (ref >59)
Glucose: 91 mg/dL (ref 65–99)
Potassium: 4.4 mmol/L (ref 3.5–5.2)
SODIUM: 143 mmol/L (ref 134–144)

## 2017-11-25 LAB — LIPID PANEL
Chol/HDL Ratio: 3 ratio (ref 0.0–4.4)
Cholesterol, Total: 207 mg/dL — ABNORMAL HIGH (ref 100–199)
HDL: 68 mg/dL (ref >39)
LDL CALC: 124 mg/dL — AB (ref 0–99)
Triglycerides: 75 mg/dL (ref 0–149)
VLDL CHOLESTEROL CAL: 15 mg/dL (ref 5–40)

## 2017-11-25 LAB — HEPATITIS C ANTIBODY: Hep C Virus Ab: <0.1 s/co ratio (ref 0.0–0.9)

## 2017-11-27 ENCOUNTER — Encounter: Payer: Self-pay | Admitting: Family Medicine

## 2017-11-28 ENCOUNTER — Ambulatory Visit: Payer: Medicare Other | Admitting: Physical Therapy

## 2017-11-29 ENCOUNTER — Ambulatory Visit
Admission: RE | Admit: 2017-11-29 | Discharge: 2017-11-29 | Disposition: A | Discharge status: Home / Self Care | Payer: Medicare Other | Source: Ambulatory Visit | Attending: Radiation Oncology | Admitting: Radiation Oncology

## 2017-11-29 ENCOUNTER — Encounter: Payer: Self-pay | Admitting: Radiation Oncology

## 2017-11-29 ENCOUNTER — Other Ambulatory Visit: Payer: Self-pay

## 2017-11-29 VITALS — BP 142/96 | HR 73 | Temp 97.5°F | Resp 18 | Ht 65.0 in | Wt 124.2 lb

## 2017-11-29 VITALS — BP 142/96 | HR 73 | Temp 97.5°F | Resp 18 | Ht 65.0 in | Wt 124.1 lb

## 2017-11-29 DIAGNOSIS — C50212 Malignant neoplasm of upper-inner quadrant of left female breast: Secondary | ICD-10-CM | POA: Insufficient documentation

## 2017-11-29 DIAGNOSIS — Z79899 Other long term (current) drug therapy: Secondary | ICD-10-CM | POA: Insufficient documentation

## 2017-11-29 DIAGNOSIS — Z170 Estrogen receptor positive status [ER+]: Secondary | ICD-10-CM | POA: Insufficient documentation

## 2017-11-29 DIAGNOSIS — Z510 Encounter for antineoplastic radiation therapy: Secondary | ICD-10-CM | POA: Insufficient documentation

## 2017-11-29 NOTE — Progress Notes (Signed)
Radiation Oncology         [phone] ________________________________  Name: Tammy Levine MRN: [id number]  Date: 11/29/2017  DOB: [birth year]  Re-Evaluation Note  CC: Sela Hilding, MD  Truitt Merle, MD    ICD-10-CM   1. Malignant neoplasm of upper-inner quadrant of left breast in female, estrogen receptor positive ([place]) C50.212    Z17.0     DIAGNOSIS: Stage pT2, cN0, cMo, Left Breast, UIQ, Invasive Ductal Carcinoma, ER (+), PR (+), HER2 (neg), grade 2  Cancer Staging Malignant neoplasm of upper-inner quadrant of left breast in female, estrogen receptor positive ([place]) Staging form: Breast, AJCC 8th Edition - Clinical stage from 10/12/2017: Stage IB (cT2, cN0, cM0, G2, ER+, PR+, HER2-) - Signed by Truitt Merle, MD on 10/18/2017  Narrative:  The patient returns today for re-evaluation following last visit to the office for Breast Clinic on 10/18/2017.  She has been doing well since her surgery. She hasn't seen Dr. Burr Medico recently. She has associated soreness to her surgical site, however, this is tolerable without medication. She notes that she had cording to her left axilla, that doesn't inhibit her from completing any ROM. She is also in physical therapy at this time. She denies soreness to her left breast, arm swelling, and any other symptoms.   Since they were last seen in the office, they had a sentinel node biopsy on 11/01/2017 that showed: Lymph node, sentinel, biopsy, Left with no carcinoma identified in one lymph node (0/1). Lymph node, sentinel, biopsy, Left with no carcinoma identified in one lymph node (0/1). Lymph node, sentinel, biopsy, Left with no carcinoma identified in one lymph node (0/1). Breast, lumpectomy, Left with invasive mammary carcinoma, with papillary and micropapillary features, nottingham grade 3 of 3, 3.1 cm. Margins uninvolved by carcinoma (0.2 cm; posterior margin).  The Oncotype DX score was 2 predicting a risk of outside the breast recurrence over the next  9 years of 3% if the patient's only systemic therapy is tamoxifen for 5 years. It also predicts no benefit from chemotherapy.   ALLERGIES:  has No Known Allergies.  Meds: Current Outpatient Medications  Medication Sig Dispense Refill  . HYDROcodone-acetaminophen (NORCO/VICODIN) 5-325 MG tablet Take 1-2 tablets by mouth every 6 (six) hours as needed for moderate pain or severe pain. 15 tablet 0  . ibuprofen (ADVIL,MOTRIN) 200 MG tablet Take 200 mg by mouth every 6 (six) hours as needed.    . loratadine (CLARITIN) 10 MG tablet Take 10 mg by mouth daily.    Marland Kitchen losartan (COZAAR) 25 MG tablet Take 1 tablet (25 mg total) by mouth daily. 90 tablet 0  . cetirizine (ZYRTEC) 10 MG tablet Take 1 tablet (10 mg total) by mouth daily. (Patient not taking: Reported on 11/29/2017) 30 tablet 11  . fluticasone (FLONASE) 50 MCG/ACT nasal spray Place 2 sprays into both nostrils daily. (Patient not taking: Reported on 11/29/2017) 16 g 6   No current facility-administered medications for this encounter.     Physical Findings: The patient is in no acute distress. Patient is alert and oriented.  height is _0  (1.651 m) and weight is 124 lb 3.2 oz (56.3 kg). Her oral temperature is 97.5 F (36.4 C) (abnormal). Her blood pressure is 142/96 (abnormal) and her pulse is 73. Her respiration is 18 and oxygen saturation is 99%.   Lungs are clear to auscultation bilaterally. Heart has regular rate and rhythm. No palpable cervical, supraclavicular, or axillary adenopathy. Abdomen soft, non-tender, normal bowel sounds. Right breast with  no palpable mass, nipple discharge, or bleeding. Left breast with long horizontal scar on the upper breast which is healing well without signs of drainage or infection.    Lab Findings: Lab Results  Component Value Date   WBC 7.5 10/18/2017   HGB 13.7 10/18/2017   HCT 42.8 10/18/2017   MCV 84.2 10/18/2017   PLT 299 10/18/2017    Radiographic Findings: Nm Sentinel Node Inj-no Rpt  (breast)  Result Date: 11/01/2017 Sulfur colloid was injected by the nuclear medicine technologist for melanoma sentinel node.    Impression:  pT2, pN0, Left Breast, UIQ, Invasive Ductal Carcinoma, ER (+), PR (+), HER2 (neg), grade 2  Patient will be a good candidate for breast conservation with radiation therapy directed at the left breast.   Today, I talked to the patient about the findings and work-up thus far.  We discussed the natural history of left breast cancer and general treatment, highlighting the role of radiotherapy in the management.  We discussed the available radiation techniques, and focused on the details of logistics and delivery.  We reviewed the anticipated acute and late sequelae associated with radiation in this setting.  The patient was encouraged to ask questions that I answered to the best of my ability.  A patient consent form was discussed and signed.  We retained a copy for our records.  The patient would like to proceed with radiation and will be scheduled for CT simulation.  Plan:  Pt will be scheduled for a CT simulation in the next few days with treatments to begin 5-6 weeks postop. She would appear to be a good candidate for hypo-fractionated accelerated radiation therapy. Anticipate 4 weeks of radiotherapy.    ____________________________________   Blair Promise, PhD, MD    This document serves as a record of services personally performed by Gery Pray, MD. It was created on his behalf by [facility], a trained medical scribe. The creation of this record is based on the scribe's personal observations and the provider's statements to them. This document has been checked and approved by the attending provider.

## 2017-11-30 ENCOUNTER — Encounter: Payer: Self-pay | Admitting: Physical Therapy

## 2017-11-30 ENCOUNTER — Ambulatory Visit: Payer: Medicare Other | Admitting: Physical Therapy

## 2017-11-30 DIAGNOSIS — Z483 Aftercare following surgery for neoplasm: Secondary | ICD-10-CM | POA: Diagnosis not present

## 2017-11-30 DIAGNOSIS — C50212 Malignant neoplasm of upper-inner quadrant of left female breast: Secondary | ICD-10-CM | POA: Diagnosis not present

## 2017-11-30 DIAGNOSIS — M25612 Stiffness of left shoulder, not elsewhere classified: Secondary | ICD-10-CM | POA: Diagnosis not present

## 2017-11-30 DIAGNOSIS — R293 Abnormal posture: Secondary | ICD-10-CM | POA: Diagnosis not present

## 2017-11-30 DIAGNOSIS — Z170 Estrogen receptor positive status [ER+]: Secondary | ICD-10-CM | POA: Diagnosis not present

## 2017-11-30 NOTE — Therapy (Addendum)
[place], [place], [address] Phone: [phone]   Fax:  [phone]  Physical Therapy Treatment  Patient Details  Name: Tammy Levine MRN: [id number] Date of Birth: [birth year] Referring Provider: Dr. Autumn Messing    Encounter Date: 11/30/2017  PT End of Session - 11/30/17 1659    Visit Number  5    Number of Visits  7    Date for PT Re-Evaluation  12/13/17    PT Start Time  2876    PT Stop Time  1600    PT Time Calculation (min)  45 min    Activity Tolerance  Patient tolerated treatment well    Behavior During Therapy  [facility] for tasks assessed/performed       Past Medical History:  Diagnosis Date  . Breast cancer, left ([place]) 09/2017    Past Surgical History:  Procedure Laterality Date  . BLEPHAROPLASTY     lower eyelid  . BREAST LUMPECTOMY WITH AXILLARY LYMPH NODE BIOPSY Left 11/01/2017   Procedure: BREAST LUMPECTOMY WITH SENTINEL LYMPH NODE MAPPING;  Surgeon: Jovita Kussmaul, MD;  Location: [place];  Service: General;  Laterality: Left;    There were no vitals filed for this visit.  Subjective Assessment - 11/30/17 1519    Subjective  Pt states the will be going for her simulation on Monday.  She will have 4 weeks of radiation.  She plans to go back to the gym on Sunday and do her body pump class.  She said the surgeon has said she can go back to her activities, but not at the level she was before She wore the tg soft and foam patch for a few days     Pertinent History  Patient was diagnosed on 10/09/17 with left grade II invasive ductal carcinoma breast cancer. It measures 3.2 cm and is located in the upper inner quadrant. It is ER/PR positive and HER2 negative with a Ki67 of 15%. She has no other medical problems.  On 11/01/2017 she underwent a left breast lumpectomy with 2 deep nodes sentinel lymph nodes removed     Patient Stated Goals  get rid of cording in axilla as soon as  possible     Currently in Pain?  No/denies   occasionally has some pain at incisions         [facility] PT Assessment - 11/30/17 0001      Palpation   Palpation comment  pt still has one guitar string cord in left axilla, but has full functional ROM with no pain                    [facility] Adult PT Treatment/Exercise - 11/30/17 0001      Self-Care   Self-Care  Other Self-Care Comments    Other Self-Care Comments   educted pt on lymphedema risk reduction precautions and issued arm bands and handouts from ABC class (she is not able to attend class)  also instructed in Strength ABC program with handout. Pt is familiar with exercise and plans [facility] Recommended that pt start with no weight on the bar and progress slowly as long as she has no increased symptoms in her arm              PT Education - 11/30/17 1658    Education Details  lymphedema risk reduction, Strength ABC program     Person(s) Educated  Patient    Methods  Explanation;Handout    Comprehension  Verbalized understanding          PT Long Term Goals - 11/30/17 1702      PT LONG TERM GOAL #1   Title  Patient will demonstrate she has regained full function and shoulder ROM post operatively.    Status  Achieved      PT LONG TERM GOAL #2   Title  Pt report that discomfort from axillary cording has decreased y 75%    Status  Achieved      PT LONG TERM GOAL #3   Title  Pt will verbalize understanding of lymphedema risk reduction precautions     Status  Achieved            Plan - 11/30/17 1700    Clinical Impression Statement  Pt is doing extremely well with shoulder range of motion and feels that she has returned to full function of her left arm.  [facility]  Time spent today on education to start low and progress slowly with exercise, listen to her how her body responds to exercise and consider wearing the Tg soft afterwards  is she feels she needs support for her arm. She was also educated in lymphedema risk reduction.  She wants to come back to check in during and /or after radiation to make sure all is going well She is anxious and does not want to get lymphedema     PT Next Visit Plan  Reassess     Consulted and Agree with Plan of Care  Patient       Patient will benefit from skilled therapeutic intervention in order to improve the following deficits and impairments:  Decreased range of motion, Impaired UE functional use, Pain, Decreased knowledge of precautions, Decreased knowledge of use of DME, Decreased skin integrity, Increased fascial restricitons, Postural dysfunction, Impaired flexibility  Visit Diagnosis: Aftercare following surgery for neoplasm  Stiffness of left shoulder joint  Abnormal posture     Problem List Patient Active Problem List   Diagnosis Date Noted  . Essential hypertension 11/24/2017  . Elevated blood sugar 11/24/2017  . Malignant neoplasm of upper-inner quadrant of left breast in female, estrogen receptor positive ([place]) 10/17/2017  . Breast mass, left 10/05/2017  . Uterine prolapse 10/05/2017  . Ear fullness, bilateral 10/05/2017  Donato Heinz. Owens Shark, PT  [place] Levo 11/30/2017, 5:03 PM  [place], [place], [address] Phone: [phone]   Fax:  [phone]  Name: Deborah Lazcano MRN: [id number] Date of Birth: [birth year]  PHYSICAL THERAPY DISCHARGE SUMMARY  Visits from Start of Care: 5  Current functional level related to goals / functional outcomes: unknown   Remaining deficits: unknown   Education / Equipment: Lymphedema risk reduction, home exercise   Plan: Patient agrees to discharge.  Patient goals were met. Patient is being discharged due to not returning since the last visit.  ?????    Donato Heinz. Owens Shark, PT

## 2017-12-01 ENCOUNTER — Telehealth: Payer: Self-pay | Admitting: *Deleted

## 2017-12-01 ENCOUNTER — Other Ambulatory Visit: Payer: Medicare Other

## 2017-12-01 DIAGNOSIS — I10 Essential (primary) hypertension: Secondary | ICD-10-CM | POA: Diagnosis not present

## 2017-12-01 NOTE — Telephone Encounter (Signed)
Pt is here for labs, BP recheck and results today (she never received letter sent by [name]).  Advised lab results per letter and gave pt a copy.  BP today was 138 /80.  Will forward to MD for FYI. Elster Corbello, Salome Spotted, CMA

## 2017-12-02 LAB — BASIC METABOLIC PANEL
BUN/Creatinine Ratio: 23 (ref 12–28)
BUN: 15 mg/dL (ref 8–27)
CHLORIDE: 104 mmol/L (ref 96–106)
CO2: 23 mmol/L (ref 20–29)
Calcium: 9.3 mg/dL (ref 8.7–10.3)
Creatinine, Ser: 0.66 mg/dL (ref 0.57–1.00)
GFR calc Af Amer: 106 mL/min/{1.73_m2} (ref >59)
GFR calc non Af Amer: 92 mL/min/{1.73_m2} (ref >59)
Glucose: 95 mg/dL (ref 65–99)
POTASSIUM: 4.5 mmol/L (ref 3.5–5.2)
SODIUM: 141 mmol/L (ref 134–144)

## 2017-12-04 ENCOUNTER — Ambulatory Visit
Admission: RE | Admit: 2017-12-04 | Discharge: 2017-12-04 | Disposition: A | Discharge status: Home / Self Care | Payer: Medicare Other | Source: Ambulatory Visit | Attending: Radiation Oncology | Admitting: Radiation Oncology

## 2017-12-04 ENCOUNTER — Ambulatory Visit: Payer: Medicare Other | Admitting: Physical Therapy

## 2017-12-04 DIAGNOSIS — Z510 Encounter for antineoplastic radiation therapy: Secondary | ICD-10-CM | POA: Diagnosis not present

## 2017-12-04 DIAGNOSIS — Z170 Estrogen receptor positive status [ER+]: Principal | ICD-10-CM

## 2017-12-04 DIAGNOSIS — C50212 Malignant neoplasm of upper-inner quadrant of left female breast: Secondary | ICD-10-CM | POA: Diagnosis not present

## 2017-12-04 NOTE — Telephone Encounter (Signed)
Please let patient know her labs look excellent. Continue BP medicine and follow up as scheduled.

## 2017-12-04 NOTE — Progress Notes (Signed)
  Radiation Oncology         [phone] ________________________________  Name: Tammy Levine MRN: [id number]  Date: 12/04/2017  DOB: 05/18/1950  SIMULATION AND TREATMENT PLANNING NOTE    ICD-10-CM   1. Malignant neoplasm of upper-inner quadrant of left breast in female, estrogen receptor positive ([place]) C50.212    Z17.0     DIAGNOSIS:  StagepT2, cN0, cMo,LeftBreast, UIQ, Invasive Ductal Carcinoma, ER(+), PR(+), HER2(neg), grade2  NARRATIVE:  The patient was brought to the [facility].  Identity was confirmed.  All relevant records and images related to the planned course of therapy were reviewed.  The patient freely provided informed written consent to proceed with treatment after reviewing the details related to the planned course of therapy. The consent form was witnessed and verified by the simulation staff.  Then, the patient was set-up in a stable reproducible  supine position for radiation therapy.  CT images were obtained.  Surface markings were placed.  The CT images were loaded into the planning software.  Then the target and avoidance structures were contoured.  Treatment planning then occurred.  The radiation prescription was entered and confirmed.  Then, I designed and supervised the construction of a total of 3 medically necessary complex treatment devices.  I have requested : 3D Simulation  I have requested a DVH of the following structures: heart, lungs, lumpectomy cavity.  I have ordered:dose calc.  PLAN:  The patient will receive 40.05 Gy in 15 fractions followed by a boost to the lumpectomy cavity of 10 gray in 5 fractions.  -----------------------------------  Optical Surface Tracking Plan:  Since intensity modulated radiotherapy (IMRT) and 3D conformal radiation treatment methods are predicated on accurate and precise positioning for treatment, intrafraction motion monitoring is medically necessary to ensure accurate and safe treatment delivery.   The ability to quantify intrafraction motion without excessive ionizing radiation dose can only be performed with optical surface tracking. Accordingly, surface imaging offers the opportunity to obtain 3D measurements of patient position throughout IMRT and 3D treatments without excessive radiation exposure.  I am ordering optical surface tracking for this patient's upcoming course of radiotherapy. ________________________________   Special treatment procedure:  was performed today due to the extra time and effort required by myself to plan and prepare this patient for deep inspiration breath hold technique.  I have determined cardiac sparing to be of benefit to this patient to prevent long term cardiac damage due to radiation of the heart.  Bellows were placed on the patient's abdomen. To facilitate cardiac sparing, the patient was coached by the radiation therapists on breath hold techniques and breathing practice was performed. Practice waveforms were obtained. The patient was then scanned while maintaining breath hold in the treatment position.  This image was then transferred over to the imaging specialist. The imaging specialist then created a fusion of the free breathing and breath hold scans using the chest wall as the stable structure. I personally reviewed the fusion in axial, coronal and sagittal image planes.  Excellent cardiac sparing was obtained.  I felt the patient is an appropriate candidate for breath hold and the patient will be treated as such.  The image fusion was then reviewed with the patient to reinforce the necessity of reproducible breath hold.      Blair Promise, PhD, MD

## 2017-12-04 NOTE — Telephone Encounter (Signed)
Pt informed. Will see Dr. Lindell Noe on 10/8. Ottis Stain, CMA

## 2017-12-05 ENCOUNTER — Telehealth: Payer: Self-pay | Admitting: Hematology

## 2017-12-05 NOTE — Telephone Encounter (Signed)
Mailed pt calendar of upcoming appts  Per 9/17 sch message

## 2017-12-07 ENCOUNTER — Encounter: Payer: Medicare Other | Admitting: Family Medicine

## 2017-12-07 DIAGNOSIS — C50212 Malignant neoplasm of upper-inner quadrant of left female breast: Secondary | ICD-10-CM | POA: Diagnosis not present

## 2017-12-07 DIAGNOSIS — Z510 Encounter for antineoplastic radiation therapy: Secondary | ICD-10-CM | POA: Diagnosis not present

## 2017-12-07 DIAGNOSIS — Z170 Estrogen receptor positive status [ER+]: Secondary | ICD-10-CM | POA: Diagnosis not present

## 2017-12-11 ENCOUNTER — Ambulatory Visit
Admission: RE | Admit: 2017-12-11 | Discharge: 2017-12-11 | Disposition: A | Discharge status: Home / Self Care | Payer: Medicare Other | Source: Ambulatory Visit | Attending: Radiation Oncology | Admitting: Radiation Oncology

## 2017-12-11 DIAGNOSIS — Z510 Encounter for antineoplastic radiation therapy: Secondary | ICD-10-CM | POA: Diagnosis not present

## 2017-12-11 DIAGNOSIS — Z170 Estrogen receptor positive status [ER+]: Principal | ICD-10-CM

## 2017-12-11 DIAGNOSIS — C50212 Malignant neoplasm of upper-inner quadrant of left female breast: Secondary | ICD-10-CM | POA: Diagnosis not present

## 2017-12-11 MED ORDER — ALPRAZOLAM 0.25 MG PO TABS: 0.2500 mg | ORAL_TABLET | Freq: Three times a day (TID) | ORAL | 0 refills | Status: DC | PRN

## 2017-12-11 NOTE — Progress Notes (Signed)
  Radiation Oncology         [phone] ________________________________  Name: Tammy Levine MRN: [id number]  Date: 12/11/2017  DOB: [birth year]  Simulation Verification Note    ICD-10-CM   1. Malignant neoplasm of upper-inner quadrant of left breast in female, estrogen receptor positive ([place]) C50.212    Z17.0      StagepT2, cN0, cMo,LeftBreast, UIQ, Invasive Ductal Carcinoma, ER(+), PR(+), HER2(neg), grade2  Status: outpatient  NARRATIVE: The patient was brought to the treatment unit and placed in the planned treatment position. The clinical setup was verified. Then port films were obtained and uploaded to the radiation oncology medical record software.  The treatment beams were carefully compared against the planned radiation fields. The position location and shape of the radiation fields was reviewed. They targeted volume of tissue appears to be appropriately covered by the radiation beams. Organs at risk appear to be excluded as planned.  Based on my personal review, I approved the simulation verification. The patient's treatment will proceed as planned.  -----------------------------------  Blair Promise, PhD, MD  This document serves as a record of services personally performed by Gery Pray, MD. It was created on his behalf by Wilburn Mylar, a trained medical scribe. The creation of this record is based on the scribe's personal observations and the provider's statements to them. This document has been checked and approved by the attending provider.

## 2017-12-12 ENCOUNTER — Ambulatory Visit
Admission: RE | Admit: 2017-12-12 | Discharge: 2017-12-12 | Disposition: A | Discharge status: Home / Self Care | Payer: Medicare Other | Source: Ambulatory Visit | Attending: Radiation Oncology | Admitting: Radiation Oncology

## 2017-12-12 DIAGNOSIS — C50212 Malignant neoplasm of upper-inner quadrant of left female breast: Secondary | ICD-10-CM | POA: Diagnosis not present

## 2017-12-12 DIAGNOSIS — Z170 Estrogen receptor positive status [ER+]: Secondary | ICD-10-CM | POA: Diagnosis not present

## 2017-12-12 DIAGNOSIS — Z510 Encounter for antineoplastic radiation therapy: Secondary | ICD-10-CM | POA: Diagnosis not present

## 2017-12-13 ENCOUNTER — Ambulatory Visit
Admission: RE | Admit: 2017-12-13 | Discharge: 2017-12-13 | Disposition: A | Discharge status: Home / Self Care | Payer: Medicare Other | Source: Ambulatory Visit | Attending: Radiation Oncology | Admitting: Radiation Oncology

## 2017-12-13 DIAGNOSIS — Z510 Encounter for antineoplastic radiation therapy: Secondary | ICD-10-CM | POA: Diagnosis not present

## 2017-12-13 DIAGNOSIS — C50212 Malignant neoplasm of upper-inner quadrant of left female breast: Secondary | ICD-10-CM | POA: Diagnosis not present

## 2017-12-13 DIAGNOSIS — Z170 Estrogen receptor positive status [ER+]: Secondary | ICD-10-CM | POA: Diagnosis not present

## 2017-12-14 ENCOUNTER — Ambulatory Visit
Admission: RE | Admit: 2017-12-14 | Discharge: 2017-12-14 | Disposition: A | Discharge status: Home / Self Care | Payer: Medicare Other | Source: Ambulatory Visit | Attending: Radiation Oncology | Admitting: Radiation Oncology

## 2017-12-14 DIAGNOSIS — C50212 Malignant neoplasm of upper-inner quadrant of left female breast: Secondary | ICD-10-CM | POA: Diagnosis not present

## 2017-12-14 DIAGNOSIS — Z170 Estrogen receptor positive status [ER+]: Secondary | ICD-10-CM | POA: Diagnosis not present

## 2017-12-14 DIAGNOSIS — Z510 Encounter for antineoplastic radiation therapy: Secondary | ICD-10-CM | POA: Diagnosis not present

## 2017-12-15 ENCOUNTER — Ambulatory Visit
Admission: RE | Admit: 2017-12-15 | Discharge: 2017-12-15 | Disposition: A | Discharge status: Home / Self Care | Payer: Medicare Other | Source: Ambulatory Visit | Attending: Radiation Oncology | Admitting: Radiation Oncology

## 2017-12-15 DIAGNOSIS — Z510 Encounter for antineoplastic radiation therapy: Secondary | ICD-10-CM | POA: Diagnosis not present

## 2017-12-15 DIAGNOSIS — C50212 Malignant neoplasm of upper-inner quadrant of left female breast: Secondary | ICD-10-CM | POA: Diagnosis not present

## 2017-12-15 DIAGNOSIS — Z170 Estrogen receptor positive status [ER+]: Secondary | ICD-10-CM | POA: Diagnosis not present

## 2017-12-18 ENCOUNTER — Ambulatory Visit
Admission: RE | Admit: 2017-12-18 | Discharge: 2017-12-18 | Disposition: A | Discharge status: Home / Self Care | Payer: Medicare Other | Source: Ambulatory Visit | Attending: Radiation Oncology | Admitting: Radiation Oncology

## 2017-12-18 DIAGNOSIS — Z170 Estrogen receptor positive status [ER+]: Secondary | ICD-10-CM | POA: Diagnosis not present

## 2017-12-18 DIAGNOSIS — Z510 Encounter for antineoplastic radiation therapy: Secondary | ICD-10-CM | POA: Diagnosis not present

## 2017-12-18 DIAGNOSIS — C50212 Malignant neoplasm of upper-inner quadrant of left female breast: Secondary | ICD-10-CM | POA: Diagnosis not present

## 2017-12-19 ENCOUNTER — Ambulatory Visit
Admission: RE | Admit: 2017-12-19 | Discharge: 2017-12-19 | Disposition: A | Discharge status: Home / Self Care | Payer: Medicare Other | Source: Ambulatory Visit | Attending: Radiation Oncology | Admitting: Radiation Oncology

## 2017-12-19 DIAGNOSIS — Z510 Encounter for antineoplastic radiation therapy: Secondary | ICD-10-CM | POA: Diagnosis not present

## 2017-12-19 DIAGNOSIS — C50212 Malignant neoplasm of upper-inner quadrant of left female breast: Secondary | ICD-10-CM | POA: Diagnosis not present

## 2017-12-19 DIAGNOSIS — Z170 Estrogen receptor positive status [ER+]: Secondary | ICD-10-CM | POA: Diagnosis not present

## 2017-12-20 ENCOUNTER — Ambulatory Visit
Admission: RE | Admit: 2017-12-20 | Discharge: 2017-12-20 | Disposition: A | Discharge status: Home / Self Care | Payer: Medicare Other | Source: Ambulatory Visit | Attending: Radiation Oncology | Admitting: Radiation Oncology

## 2017-12-20 DIAGNOSIS — Z170 Estrogen receptor positive status [ER+]: Secondary | ICD-10-CM | POA: Diagnosis not present

## 2017-12-20 DIAGNOSIS — Z510 Encounter for antineoplastic radiation therapy: Secondary | ICD-10-CM | POA: Diagnosis not present

## 2017-12-20 DIAGNOSIS — C50212 Malignant neoplasm of upper-inner quadrant of left female breast: Secondary | ICD-10-CM | POA: Diagnosis not present

## 2017-12-21 ENCOUNTER — Ambulatory Visit
Admission: RE | Admit: 2017-12-21 | Discharge: 2017-12-21 | Disposition: A | Discharge status: Home / Self Care | Payer: Medicare Other | Source: Ambulatory Visit | Attending: Radiation Oncology | Admitting: Radiation Oncology

## 2017-12-21 DIAGNOSIS — C50212 Malignant neoplasm of upper-inner quadrant of left female breast: Secondary | ICD-10-CM | POA: Diagnosis not present

## 2017-12-21 DIAGNOSIS — Z510 Encounter for antineoplastic radiation therapy: Secondary | ICD-10-CM | POA: Diagnosis not present

## 2017-12-21 DIAGNOSIS — Z170 Estrogen receptor positive status [ER+]: Secondary | ICD-10-CM | POA: Diagnosis not present

## 2017-12-22 ENCOUNTER — Ambulatory Visit
Admission: RE | Admit: 2017-12-22 | Discharge: 2017-12-22 | Disposition: A | Discharge status: Home / Self Care | Payer: Medicare Other | Source: Ambulatory Visit | Attending: Radiation Oncology | Admitting: Radiation Oncology

## 2017-12-22 DIAGNOSIS — Z510 Encounter for antineoplastic radiation therapy: Secondary | ICD-10-CM | POA: Diagnosis not present

## 2017-12-22 DIAGNOSIS — C50212 Malignant neoplasm of upper-inner quadrant of left female breast: Secondary | ICD-10-CM | POA: Diagnosis not present

## 2017-12-22 DIAGNOSIS — Z170 Estrogen receptor positive status [ER+]: Secondary | ICD-10-CM | POA: Diagnosis not present

## 2017-12-25 ENCOUNTER — Ambulatory Visit
Admission: RE | Admit: 2017-12-25 | Discharge: 2017-12-25 | Disposition: A | Discharge status: Home / Self Care | Payer: Medicare Other | Source: Ambulatory Visit | Attending: Radiation Oncology | Admitting: Radiation Oncology

## 2017-12-25 DIAGNOSIS — Z170 Estrogen receptor positive status [ER+]: Secondary | ICD-10-CM | POA: Diagnosis not present

## 2017-12-25 DIAGNOSIS — C50212 Malignant neoplasm of upper-inner quadrant of left female breast: Secondary | ICD-10-CM | POA: Diagnosis not present

## 2017-12-25 DIAGNOSIS — Z510 Encounter for antineoplastic radiation therapy: Secondary | ICD-10-CM | POA: Diagnosis not present

## 2017-12-26 ENCOUNTER — Other Ambulatory Visit: Payer: Self-pay

## 2017-12-26 ENCOUNTER — Ambulatory Visit (INDEPENDENT_AMBULATORY_CARE_PROVIDER_SITE_OTHER): Payer: Medicare Other | Admitting: Family Medicine

## 2017-12-26 ENCOUNTER — Encounter: Payer: Self-pay | Admitting: Family Medicine

## 2017-12-26 ENCOUNTER — Ambulatory Visit: Payer: Medicare Other | Admitting: Radiation Oncology

## 2017-12-26 ENCOUNTER — Ambulatory Visit
Admission: RE | Admit: 2017-12-26 | Discharge: 2017-12-26 | Disposition: A | Discharge status: Home / Self Care | Payer: Medicare Other | Source: Ambulatory Visit | Attending: Radiation Oncology | Admitting: Radiation Oncology

## 2017-12-26 VITALS — BP 126/70 | HR 60 | Temp 97.6°F | Ht 63.0 in | Wt 123.0 lb

## 2017-12-26 DIAGNOSIS — H938X3 Other specified disorders of ear, bilateral: Secondary | ICD-10-CM

## 2017-12-26 DIAGNOSIS — R2242 Localized swelling, mass and lump, left lower limb: Secondary | ICD-10-CM | POA: Diagnosis not present

## 2017-12-26 DIAGNOSIS — I10 Essential (primary) hypertension: Secondary | ICD-10-CM

## 2017-12-26 DIAGNOSIS — C50212 Malignant neoplasm of upper-inner quadrant of left female breast: Secondary | ICD-10-CM | POA: Diagnosis not present

## 2017-12-26 DIAGNOSIS — Z510 Encounter for antineoplastic radiation therapy: Secondary | ICD-10-CM | POA: Diagnosis not present

## 2017-12-26 DIAGNOSIS — Z170 Estrogen receptor positive status [ER+]: Secondary | ICD-10-CM | POA: Diagnosis not present

## 2017-12-26 MED ORDER — TRETINOIN 0.1 % EX CREA: TOPICAL_CREAM | Freq: Every day | CUTANEOUS | 3 refills | Status: DC

## 2017-12-26 MED ORDER — LOSARTAN POTASSIUM 25 MG PO TABS: 25.0000 mg | ORAL_TABLET | Freq: Every day | ORAL | 3 refills | Status: DC

## 2017-12-26 NOTE — Patient Instructions (Addendum)
It was a pleasure to see you today! Thank you for choosing [facility] for your primary care. Tammy Levine was seen for blood pressure, ear fullness.   Our plans for today were:  We will send you over to an ENT doctor to check on your ears.   For your two foot lumps, these are nothing to worry about and not dangerous.   I sent a refill of your cream.   Keep taking your blood pressure medicine. I will call you if this is abnormal.   Best,  Dr. Lindell Noe

## 2017-12-26 NOTE — Progress Notes (Signed)
   CC: HTN, foot mass, facial roughness  HPI  HTN - taking losartan, doing well. No concerns with SEs. Feels "as well as I ever have."   Foot mass - has two lumps on her foot that have been there for months. Given her breast lump and the resulting findings, she is worried. No pain, no trouble with exercise, no growth or warmth or redness. Hx of onocomycosis w toenail removals, no other surgeries.   Roughness of facial skin - previous derm prescribed tretoinin, she would like a refill. No SEs.  Ear fullness - about 50-60% improved with antihistamine, still feels her singing notes are off.     ROS: Denies CP, SOB, abdominal pain, dysuria, changes in BMs.   CC, SH/smoking status, and VS noted  Objective: BP 126/70   Pulse 60   Temp 97.6 F (36.4 C) (Oral)   Ht 5\' 3"  (1.6 m)   Wt 123 lb (55.8 kg)   LMP 03/21/2002   SpO2 99%   BMI 21.79 kg/m  Gen: NAD, alert, cooperative, and pleasant. HEENT: NCAT, EOMI, PERRL, roughness over bilateral malar distribution CV: RRR, no murmur Resp: CTAB, no wheezes, non-labored Abd: SNTND, BS present, no guarding or organomegaly Ext: No edema, warm. Prominence of base of L 1st phalange without overlying skin changes or tenderness, 1cm somewhat mobile mass over L plantar fascia, no overlying skin changes.  Neuro: Alert and oriented, Speech clear, No gross deficits  Assessment and plan:  Plantar mass - likely scar tissue, no need to intervene at present, reassured.   Prominent base of phalange - reassured.   Facial roughness - refill tretinoin. Continue sunscreen.  Essential hypertension BP well controlled here and at home, continue ARB, recheck BMP today.   Ear fullness, bilateral Partially improved with antihistamine, she still feels her singing is off and would like to see ENT. Referral placed.    Orders Placed This Encounter  Procedures  . Basic metabolic panel  . Ambulatory referral to ENT    Referral Priority:   Routine    Referral  Type:   Consultation    Referral Reason:   Specialty Services Required    Requested Specialty:   Otolaryngology    Number of Visits Requested:   1    Meds ordered this encounter  Medications  . tretinoin (RETIN-A) 0.1 % cream    Sig: Apply topically at bedtime.    Dispense:  45 g    Refill:  3  . losartan (COZAAR) 25 MG tablet    Sig: Take 1 tablet (25 mg total) by mouth daily.    Dispense:  90 tablet    Refill:  3    Ralene Ok, MD, PGY3 12/29/2017 8:09 AM

## 2017-12-27 ENCOUNTER — Ambulatory Visit
Admission: RE | Admit: 2017-12-27 | Discharge: 2017-12-27 | Disposition: A | Discharge status: Home / Self Care | Payer: Medicare Other | Source: Ambulatory Visit | Attending: Radiation Oncology | Admitting: Radiation Oncology

## 2017-12-27 ENCOUNTER — Telehealth: Payer: Self-pay

## 2017-12-27 DIAGNOSIS — Z170 Estrogen receptor positive status [ER+]: Secondary | ICD-10-CM | POA: Diagnosis not present

## 2017-12-27 DIAGNOSIS — Z510 Encounter for antineoplastic radiation therapy: Secondary | ICD-10-CM | POA: Diagnosis not present

## 2017-12-27 DIAGNOSIS — C50212 Malignant neoplasm of upper-inner quadrant of left female breast: Secondary | ICD-10-CM | POA: Diagnosis not present

## 2017-12-27 LAB — BASIC METABOLIC PANEL
BUN/Creatinine Ratio: 31 — ABNORMAL HIGH (ref 12–28)
BUN: 23 mg/dL (ref 8–27)
CALCIUM: 9.2 mg/dL (ref 8.7–10.3)
CHLORIDE: 104 mmol/L (ref 96–106)
CO2: 24 mmol/L (ref 20–29)
Creatinine, Ser: 0.75 mg/dL (ref 0.57–1.00)
GFR calc Af Amer: 96 mL/min/{1.73_m2} (ref >59)
GFR calc non Af Amer: 83 mL/min/{1.73_m2} (ref >59)
GLUCOSE: 99 mg/dL (ref 65–99)
Potassium: 3.9 mmol/L (ref 3.5–5.2)
Sodium: 142 mmol/L (ref 134–144)

## 2017-12-27 NOTE — Telephone Encounter (Signed)
Received fax from [place], [place] needed on Tretinoin Cream.  Clinical questions printed and placed in PCP's box for completion.  Cover My Meds info: Key: [id number]   Esau Grew, RN

## 2017-12-28 ENCOUNTER — Ambulatory Visit: Payer: Medicare Other | Admitting: Radiation Oncology

## 2017-12-28 ENCOUNTER — Telehealth: Payer: Self-pay | Admitting: *Deleted

## 2017-12-28 ENCOUNTER — Ambulatory Visit
Admission: RE | Admit: 2017-12-28 | Discharge: 2017-12-28 | Disposition: A | Discharge status: Home / Self Care | Payer: Medicare Other | Source: Ambulatory Visit | Attending: Radiation Oncology | Admitting: Radiation Oncology

## 2017-12-28 DIAGNOSIS — Z510 Encounter for antineoplastic radiation therapy: Secondary | ICD-10-CM | POA: Diagnosis not present

## 2017-12-28 DIAGNOSIS — Z170 Estrogen receptor positive status [ER+]: Secondary | ICD-10-CM | POA: Diagnosis not present

## 2017-12-28 DIAGNOSIS — C50212 Malignant neoplasm of upper-inner quadrant of left female breast: Secondary | ICD-10-CM | POA: Diagnosis not present

## 2017-12-28 NOTE — Telephone Encounter (Signed)
LVM to call office back to inform her of below.Zimmerman Rumple, April D, CMA  

## 2017-12-28 NOTE — Telephone Encounter (Signed)
-----   Message from Sela Hilding, MD sent at 12/28/2017  3:33 PM EDT ----- [facility] team, please let patient know her kidney function and electrolytes are normal.

## 2017-12-29 ENCOUNTER — Ambulatory Visit
Admission: RE | Admit: 2017-12-29 | Discharge: 2017-12-29 | Disposition: A | Discharge status: Home / Self Care | Payer: Medicare Other | Source: Ambulatory Visit | Attending: Radiation Oncology | Admitting: Radiation Oncology

## 2017-12-29 DIAGNOSIS — C50212 Malignant neoplasm of upper-inner quadrant of left female breast: Secondary | ICD-10-CM | POA: Diagnosis not present

## 2017-12-29 DIAGNOSIS — Z510 Encounter for antineoplastic radiation therapy: Secondary | ICD-10-CM | POA: Diagnosis not present

## 2017-12-29 DIAGNOSIS — Z170 Estrogen receptor positive status [ER+]: Secondary | ICD-10-CM | POA: Diagnosis not present

## 2017-12-29 NOTE — Telephone Encounter (Signed)
Pt informed of info below. Ottis Stain, CMA

## 2017-12-29 NOTE — Assessment & Plan Note (Signed)
Partially improved with antihistamine, she still feels her singing is off and would like to see ENT. Referral placed.

## 2017-12-29 NOTE — Assessment & Plan Note (Signed)
BP well controlled here and at home, continue ARB, recheck BMP today.

## 2018-01-01 ENCOUNTER — Ambulatory Visit
Admission: RE | Admit: 2018-01-01 | Discharge: 2018-01-01 | Disposition: A | Discharge status: Home / Self Care | Payer: Medicare Other | Source: Ambulatory Visit | Attending: Radiation Oncology | Admitting: Radiation Oncology

## 2018-01-01 DIAGNOSIS — C50212 Malignant neoplasm of upper-inner quadrant of left female breast: Secondary | ICD-10-CM | POA: Diagnosis not present

## 2018-01-01 DIAGNOSIS — Z510 Encounter for antineoplastic radiation therapy: Secondary | ICD-10-CM | POA: Diagnosis not present

## 2018-01-01 DIAGNOSIS — Z170 Estrogen receptor positive status [ER+]: Secondary | ICD-10-CM | POA: Diagnosis not present

## 2018-01-01 NOTE — Telephone Encounter (Signed)
Retrieved from fax cabinet and submitted electronically. Status pending. May take up to 72 hours.   Key: Baldwin Crown, RN [facility] RN)

## 2018-01-01 NOTE — Telephone Encounter (Signed)
Received the same form x 5-6 last week and filled out and faxed back. Unsure if insurance will approve. I will discard current form.

## 2018-01-02 ENCOUNTER — Ambulatory Visit
Admission: RE | Admit: 2018-01-02 | Discharge: 2018-01-02 | Disposition: A | Discharge status: Home / Self Care | Payer: Medicare Other | Source: Ambulatory Visit | Attending: Radiation Oncology | Admitting: Radiation Oncology

## 2018-01-02 DIAGNOSIS — Z170 Estrogen receptor positive status [ER+]: Principal | ICD-10-CM

## 2018-01-02 DIAGNOSIS — C50212 Malignant neoplasm of upper-inner quadrant of left female breast: Secondary | ICD-10-CM

## 2018-01-02 DIAGNOSIS — Z510 Encounter for antineoplastic radiation therapy: Secondary | ICD-10-CM | POA: Diagnosis not present

## 2018-01-02 MED ORDER — RADIAPLEXRX EX GEL
Freq: Once | CUTANEOUS | Status: AC
  Administered 2018-01-02: 18:00:00 via TOPICAL

## 2018-01-02 NOTE — Telephone Encounter (Signed)
Denied per CoverMyMeds. Note to PCP to advise.  Danley Danker, RN [facility] RN)

## 2018-01-03 ENCOUNTER — Ambulatory Visit
Admission: RE | Admit: 2018-01-03 | Discharge: 2018-01-03 | Disposition: A | Discharge status: Home / Self Care | Payer: Medicare Other | Source: Ambulatory Visit | Attending: Radiation Oncology | Admitting: Radiation Oncology

## 2018-01-03 DIAGNOSIS — C50212 Malignant neoplasm of upper-inner quadrant of left female breast: Secondary | ICD-10-CM | POA: Diagnosis not present

## 2018-01-03 DIAGNOSIS — Z510 Encounter for antineoplastic radiation therapy: Secondary | ICD-10-CM | POA: Diagnosis not present

## 2018-01-03 DIAGNOSIS — Z170 Estrogen receptor positive status [ER+]: Secondary | ICD-10-CM | POA: Diagnosis not present

## 2018-01-03 NOTE — Progress Notes (Signed)
[facility]  [phone] [phone]  Clinic Follow-up Note   Patient Care Team: Sela Hilding, MD as PCP - General (Family Medicine) Jovita Kussmaul, MD as Consulting Physician (General Surgery) Truitt Merle, MD as Consulting Physician (Hematology) Gery Pray, MD as Consulting Physician (Radiation Oncology)   Date of Service:  01/04/2018  CHIEF COMPLAINTS:  F/u on Malignant neoplasm of upper-inner quadrant of left breast   Oncology History   Cancer Staging Malignant neoplasm of upper-inner quadrant of left breast in female, estrogen receptor positive ([place]) Staging form: Breast, AJCC 8th Edition - Clinical stage from 10/12/2017: Stage IB (cT2, cN0, cM0, G2, ER+, PR+, HER2-) - Signed by Truitt Merle, MD on 10/18/2017 - Pathologic stage from 11/01/2017: Stage IA (pT2, pN0, cM0, G3, ER+, PR+, HER2-, Oncotype DX score: 2) - Signed by Truitt Merle, MD on 01/03/2018       Malignant neoplasm of upper-inner quadrant of left breast in female, estrogen receptor positive ([place])   10/09/2017 Mammogram    Diagnostic Mammogram 10/09/17 IMPRESSION: 3.2 x 3.0 x 2.0 cm mass with imaging features highly suspicious for malignancy in the 11 o'clock position of the left breast.    10/11/2017 Initial Biopsy    Diagnosis 10/11/17 Breast, left, needle core biopsy, upper inner 11 o'clock - INVASIVE DUCTAL CARCINOMA WITH PAPILLARY FEATURES, SEE COMMENT.     10/11/2017 Receptors her2    Estrogen Receptor: 100%, POSITIVE, STRONG STAINING INTENSITY Progesterone Receptor: 80%, POSITIVE, STRONG STAINING INTENSITY Proliferation Marker Ki67: 15% HER2 - NEGATIVE    10/12/2017 Cancer Staging    Staging form: Breast, AJCC 8th Edition - Clinical stage from 10/12/2017: Stage IB (cT2, cN0, cM0, G2, ER+, PR+, HER2-) - Signed by Truitt Merle, MD on 10/18/2017    10/17/2017 Initial Diagnosis    Malignant neoplasm of upper-inner quadrant of left breast in female, estrogen receptor  positive ([place])    11/01/2017 Surgery     She had left breast lumpectomy with Dr. Marlou Starks on 11/01/2017    11/01/2017 Pathology Results    11/01/2017 Surgical Pathology Diagnosis 1. Lymph node, sentinel, biopsy, Left - NO CARCINOMA IDENTIFIED IN ONE LYMPH NODE (0/1) 2. Lymph node, sentinel, biopsy, Left - NO CARCINOMA IDENTIFIED IN ONE LYMPH NODE (0/1) 3. Lymph node, sentinel, biopsy, Left - NO CARCINOMA IDENTIFIED IN ONE LYMPH NODE (0/1) 4. Breast, lumpectomy, Left - INVASIVE MAMMARY CARCINOMA, WITH PAPILLARY AND MICROPAPILLARY FEATURES, NOTTINGHAM GRADE 3 OF 3, 3.1 CM - MARGINS UNINVOLVED BY CARCINOMA (0.2 CM; POSTERIOR MARGIN) - SEE ONCOLOGY TABLE AND COMMENT BELOW    11/01/2017 Oncotype testing    RS Score 2 Distant recurrence risk at 9 years 3% Group average absolute chemo benefit <1%    11/01/2017 Cancer Staging    Staging form: Breast, AJCC 8th Edition - Pathologic stage from 11/01/2017: Stage IA (pT2, pN0, cM0, G3, ER+, PR+, HER2-, Oncotype DX score: 2) - Signed by Truitt Merle, MD on 01/03/2018    12/12/2017 -  Radiation Therapy    Daily radiation therapy with Dr. Sondra Come started on 12/12/2017 and plans to complete on 01/05/18     HISTORY OF PRESENTING ILLNESS:   Tammy Levine 67 y.o. female is a here because of newly diagnosed left breast cancer. The patient presents to the Breast Clinic today accompanied by herself   She notes she has not had insurance for a long time and has not been doing regular mammogram screenings in the past. She notes she palpated her mass 3 months ago but was not  sure if it was cancerous. She went to her PCP on 10/04/17  and then had a mammogram before biopsy which determined the lump to be cancer. She notes breast sensitivity with mass. She notes she did not pay attention to any size change of her breast mass. She notes she would mildly check her breast regularly. She notes she freaked out when she noticed the lump herself.   Today she denies change in  appetite or weight, she is at baseline overall. She is very active with exercise. She notes 1 day of breast pain post biopsy and she took aleve and Unisom to her sleep for this. She notes she is anxious about her diagnosis as she did not think she would get cancer.  Socially she does not have children. She is a retired Secondary school teacher. She moved to [place] 3 years ago. She takes philosophy classes at [employer] for her leisure.   She notes no significant PMHx. She had cosmetic surgery on her right eye. She notes to having a history of regular periods. Her menopause was tolerable. Her mother had a lumpectomy but not sure what was removed. She is not sure what cancer her father had. Both of her parents passed from renal failure. 2 of her maternal half-cousins had breast cancer. She attempted smoking cigarettes but never a habit. She stopped smoking marijuana in 1990. She does not do recreational drugs. She drinks occasionally. She notes she only takes Flonase as needed.    GYN HISTORY   Menarchal: 14 LMP: 2004 at 67yo Contraceptive: For 1 year at [age]yo HRT: No  G1P0A1: 1 abortion at age 73yo.   CURRENT THERAPY Daily radiation therapy with Dr. Sondra Come started on 12/12/2017 and plan to complete on 01/05/18  INTERVAL HISTORY  Tammy Levine is a 67 y.o. female who is here for follow-up of her left breast cancer. She started daily radiation therapy with Dr. Sondra Come 12/12/2017. She had left breast lumpectomy with Dr. Marlou Starks on 11/01/2017.   She presents to the clinic today by herself. She notes she was able to recover very well and was able to return to gym and regular schedule and activity 5 weeks ago. She has been tolerating radiation with very little fatigue and slight skin change. She has not needed her Vicodin or any OTC pain medication. Dr. Sondra Come did prescribe her Xanax as needed for her anxiety.  She hopes to be able to continue her progress on working out and being active even while on  anti-estrogen therapy.     MEDICAL HISTORY:  Past Medical History:  Diagnosis Date  . Breast cancer, left ([place]) 09/2017    SURGICAL HISTORY: Past Surgical History:  Procedure Laterality Date  . BLEPHAROPLASTY     lower eyelid  . BREAST LUMPECTOMY WITH AXILLARY LYMPH NODE BIOPSY Left 11/01/2017   Procedure: BREAST LUMPECTOMY WITH SENTINEL LYMPH NODE MAPPING;  Surgeon: Jovita Kussmaul, MD;  Location: [facility];  Service: General;  Laterality: Left;    SOCIAL HISTORY: Social History   Socioeconomic History  . Marital status: Single    Spouse name: Not on file  . Number of children: Not on file  . Years of education: Not on file  . Highest education level: Not on file  Occupational History  . Not on file  Social Needs  . Financial resource strain: Not on file  . Food insecurity:    Worry: Not on file    Inability: Not on file  . Transportation needs:  Medical: Not on file    Non-medical: Not on file  Tobacco Use  . Smoking status: Never Smoker  . Smokeless tobacco: Never Used  Substance and Sexual Activity  . Alcohol use: Yes    Comment: occasionally  . Drug use: Yes  . Sexual activity: Not on file  Lifestyle  . Physical activity:    Days per week: Not on file    Minutes per session: Not on file  . Stress: Not on file  Relationships  . Social connections:    Talks on phone: Not on file    Gets together: Not on file    Attends religious service: Not on file    Active member of club or organization: Not on file    Attends meetings of clubs or organizations: Not on file    Relationship status: Not on file  . Intimate partner violence:    Fear of current or ex partner: Not on file    Emotionally abused: Not on file    Physically abused: Not on file    Forced sexual activity: Not on file  Other Topics Concern  . Not on file  Social History Narrative  . Not on file    FAMILY HISTORY: Family History  Problem Relation Age of Onset  . Cancer  Mother        breast cancer?   . Chronic Renal Failure Mother   . Chronic Renal Failure Father   . Breast cancer Paternal Grandfather   . Breast cancer Cousin     ALLERGIES:  has No Known Allergies.  MEDICATIONS:  Current Outpatient Medications  Medication Sig Dispense Refill  . ALPRAZolam (XANAX) 0.25 MG tablet Take 1 tablet (0.25 mg total) by mouth 3 (three) times daily as needed for anxiety. 30 tablet 0  . cetirizine (ZYRTEC) 10 MG tablet Take 1 tablet (10 mg total) by mouth daily. 30 tablet 11  . losartan (COZAAR) 25 MG tablet Take 1 tablet (25 mg total) by mouth daily. 90 tablet 3  . fluticasone (FLONASE) 50 MCG/ACT nasal spray Place 2 sprays into both nostrils daily. (Patient not taking: Reported on 11/29/2017) 16 g 6  . HYDROcodone-acetaminophen (NORCO/VICODIN) 5-325 MG tablet Take 1-2 tablets by mouth every 6 (six) hours as needed for moderate pain or severe pain. (Patient not taking: Reported on 01/04/2018) 15 tablet 0  . ibuprofen (ADVIL,MOTRIN) 200 MG tablet Take 200 mg by mouth every 6 (six) hours as needed.    . loratadine (CLARITIN) 10 MG tablet Take 10 mg by mouth daily.    Marland Kitchen tretinoin (RETIN-A) 0.1 % cream Apply topically at bedtime. (Patient not taking: Reported on 01/04/2018) 45 g 3   No current facility-administered medications for this visit.     REVIEW OF SYSTEMS:   Constitutional: Denies fevers, chills or abnormal night sweats Eyes: Denies blurriness of vision, double vision or watery eyes Ears, nose, mouth, throat, and face: Denies mucositis or sore throat Respiratory: Denies cough, dyspnea or wheezes Cardiovascular: Denies palpitation, chest discomfort or lower extremity swelling Gastrointestinal:  Denies nausea, heartburn or change in bowel habits Skin: Denies abnormal skin rashes Lymphatics: Denies new lymphadenopathy or easy bruising Neurological:Denies numbness, tingling or new weaknesses Behavioral/Psych: Mood is stable, no new changes  (+) anxious All  other systems were reviewed with the patient and are negative.  PHYSICAL EXAMINATION: ECOG PERFORMANCE STATUS: 0 - Asymptomatic  Vitals:   01/04/18 1410  BP: (!) 142/87  Pulse: 64  Resp: 18  SpO2: 100%  Filed Weights   01/04/18 1410  Weight: 123 lb 3.2 oz (55.9 kg)    GENERAL:alert, no distress and comfortable SKIN: skin color, texture, turgor are normal, no rashes or significant lesions EYES: normal, conjunctiva are pink and non-injected, sclera clear OROPHARYNX:no exudate, no erythema and lips, buccal mucosa, and tongue normal  NECK: supple, thyroid normal size, non-tender, without nodularity LYMPH:  no palpable lymphadenopathy in the cervical, axillary or inguinal LUNGS: clear to auscultation and percussion with normal breathing effort HEART: regular rate & rhythm and no murmurs and no lower extremity edema ABDOMEN:abdomen soft, non-tender and normal bowel sounds Musculoskeletal:no cyanosis of digits and no clubbing  PSYCH: alert & oriented x 3 with fluent speech NEURO: no focal motor/sensory deficits BREAST: S/p left breast lumpectomy: Surgical incision healing well (+) mild skin erythema of left breast from radiation, no palpable mass or adenopathy.   LABORATORY DATA:  I have reviewed the data as listed CBC Latest Ref Rng & Units 10/18/2017  WBC 3.9 - 10.3 K/uL 7.5  Hemoglobin 11.6 - 15.9 g/dL 13.7  Hematocrit 34.8 - 46.6 % 42.8  Platelets 145 - 400 K/uL 299    CMP Latest Ref Rng & Units 12/26/2017 12/01/2017 11/24/2017  Glucose 65 - 99 mg/dL 99 95 91  BUN 8 - 27 mg/dL _0 Creatinine 0.57 - 1.00 mg/dL 0.75 0.66 0.72  Sodium 134 - 144 mmol/L 142 141 143  Potassium 3.5 - 5.2 mmol/L 3.9 4.5 4.4  Chloride 96 - 106 mmol/L 104 104 104  CO2 20 - 29 mmol/L _1 Calcium 8.7 - 10.3 mg/dL 9.2 9.3 9.5  Total Protein 6.5 - 8.1 g/dL - - -  Total Bilirubin 0.3 - 1.2 mg/dL - - -  Alkaline Phos 38 - 126 U/L - - -  AST 15 - 41 U/L - - -  ALT 0 - 44 U/L - - -     PATHOLOGY  11/01/2017 Molecular Pathology   11/01/2017 Surgical Pathology Diagnosis 1. Lymph node, sentinel, biopsy, Left - NO CARCINOMA IDENTIFIED IN ONE LYMPH NODE (0/1) 2. Lymph node, sentinel, biopsy, Left - NO CARCINOMA IDENTIFIED IN ONE LYMPH NODE (0/1) 3. Lymph node, sentinel, biopsy, Left - NO CARCINOMA IDENTIFIED IN ONE LYMPH NODE (0/1) 4. Breast, lumpectomy, Left - INVASIVE MAMMARY CARCINOMA, WITH PAPILLARY AND MICROPAPILLARY FEATURES, NOTTINGHAM GRADE 3 OF 3, 3.1 CM - MARGINS UNINVOLVED BY CARCINOMA (0.2 CM; POSTERIOR MARGIN) - SEE ONCOLOGY TABLE AND COMMENT BELOW Microscopic Comment 4. INVASIVE CARCINOMA OF THE BREAST: Resection Procedure: Excision Specimen Laterality: Left Tumor Size: 3.1 cm Histologic Type: Invasive mammary with papillary and micropapillary features Histologic Grade: Glandular (Acinar)/Tubular Differentiation: 2 Nuclear Pleomorphism: 2 Mitotic Rate: 2 Overall Grade: Nottingham Grade 2 Ductal Carcinoma In Situ: Not identified Margins: Margins uninvolved by carcinoma Distance from closest margin (millimeters): 2 mm Specify closest margin (required only if <48m): posterior DCIS Margin: N/A Regional Lymph Nodes: Number of Lymph Nodes Examined: 3 Number of Sentinel Nodes Examined (if applicable): 3 Number of Lymph Nodes with Macrometastases (>2 mm): 0 Number of Lymph Nodes with Micrometastases: 0 Number of Lymph Nodes with Isolated Tumor Cells (?0.2 mm or ?200 cells)#: 0 Size of Largest Metastatic Deposit (millimeters): N/A Extranodal Extension: N/A Treatment Effect: No known presurgical therapy Breast Biomarker Testing Performed on Previous Biopsy: Testing Performed on Case Number: [id number] Estrogen Receptor: Positive (100%, strong) Progesterone Receptor: Positive (80%, strong) HER2: Negative ki-67: 15% Representative tumor block: 3A, 3B, 3C Pathologic Stage Classification (pTNM, AJCC 8th Edition):  pT2,  pN0 (v4.2.0.0)   Diagnosis 10/11/17 Breast, left, needle core biopsy, upper inner 11 o'clock - INVASIVE DUCTAL CARCINOMA WITH PAPILLARY FEATURES, SEE COMMENT. Microscopic Comment The carcinoma appears grade 2. Prognostic markers will be ordered. Dr. Lyndon Code has reviewed the case. The case was called to [facility] on 10/12/2017. Results: IMMUNOHISTOCHEMICAL AND MORPHOMETRIC ANALYSIS PERFORMED MANUALLY Estrogen Receptor: 100%, POSITIVE, STRONG STAINING INTENSITY Progesterone Receptor: 80%, POSITIVE, STRONG STAINING INTENSITY Proliferation Marker Ki67: 15% REFERENCE RANGE ESTROGEN RECEPTOR NEGATIVE 0% POSITIVE =>1% REFERENCE RANGE PROGESTERONE RECEPTOR NEGATIVE 0% POSITIVE =>1% All controls stained appropriately Thressa Sheller MD Pathologist, Electronic Signature ( Signed 10/17/2017) FLUORESCENCE IN-SITU HYBRIDIZATION Results: HER2 - NEGATIVE RATIO OF HER2/CEP17 SIGNALS 1.32 AVERAGE HER2 COPY NUMBER PER CELL 1.85   RADIOGRAPHIC STUDIES: I have personally reviewed the radiological images as listed and agreed with the findings in the report. No results found.   10/09/2017 Mammogram IMPRESSION: 3.2 cm mass with imaging features highly suspicious for malignancy in the 11 o'clock position of the left breast.  ASSESSMENT & PLAN:  Tammy Levine is a 67 y.o. Caucasian female with no significant PMHx.    1. Malignant neoplasm of upper-inner quadrant of left breast, Stage IA, pT2N0M0, ER/PR: (+), HER2 (-), Grade III, RS 2 --I discussed her surgical pathology showed her Grade III tumor was completely resected, negative margins and nodes were negative, 0/3.  -I discussed her Oncotype report which showed low risk disease with recurrence score of 2 and with anti-estrogen therapy alone there is a distant recurrence risk of 3% in the next 9 years. Without AI there is a 5-6% risk of recurrence. There is less than 1% benefit of adjuvant chemotherapy, so I do not recommend  chemotherapy.  -She started radiation with Dr. Sondra Come on 12/12/17 and plans to complete on 01/05/18.  -Giving the strong ER and PR expression in her postmenopausal status, I recommend adjuvant endocrine therapy with aromatase inhibitor or Tamoxifen, for a total of 5-10 years to reduce the risk of cancer recurrence. She does not have a new baseline bone density scan. If her bone density is significantly low I would prescribe her Tamoxifen. I gave her reading material on these medications.  -I discussed the potential benefit and side effects of Anastrozole which includes hot flash, skin and vaginal dryness, increased blood glucose, cholesterol, weight, slightly increased risk of cardiovascular disease, cataracts, muscular and joint discomfort, osteopenia and osteoporosis, etc, were discussed with her in great details. I also discussed the potential side effects of Tamoxifen which includes but hot flash, skin and vaginal dryness, slightly increased risk of cardiovascular disease and cataract, small risk of thrombosis and endometrial cancer, were discussed with her in great details.  -If she is not able to tolerate Anastrozole, we can change her to another AI.  -We also discussed the breast cancer surveillance after her surgery. She will continue annual screening mammogram, self exam, and a routine office visit with lab and exam with [name]. -I encouraged her to watch for new or concerning hip pain, back pain or headaches as these are signs of possible recurrence.  -I again discussed the option of genetic testing. She is not interested at this time.   -I offered her the chance to attend Survivorship clinic for post treatment support. She is interested. I will schedule for her in 3 months -F/u in 6 months     2. Bone health  -Pt notes her last bone density scan was many years ago and normal.  -I discussed  AI such Anastrozole can weaken her bone.  -I will get a new baseline DEXA. If she has or is at risk for  significant osteoporosis I will start her on Tamoxifen. Otherwise, I will prescribe her Anastrozole.  -I encouraged her to continue building her strength with exercise and being active.    3. Elevated BP and hyperglycemia -At initial consult her BP and BG were elevated.  -I suggest she check BP at home when she is more relaxed -I suggest her to f/u with PCP and test for HbA1c.  -BP at 142/87 today (01/04/18)    PLAN: -DEXA scan at [facility] in 2 weeks. I will call her with the result.  Unless she has severe osteoporosis, I will prescribe anastrozole for her. -Survivorship in 3 months  -Lab and f/u in 6 months   Orders Placed This Encounter  Procedures  . DG Bone Density    Standing Status:   Future    Standing Expiration Date:   01/04/2019    Order Specific Question:   Reason for Exam (SYMPTOM  OR DIAGNOSIS REQUIRED)    Answer:   screening    Order Specific Question:   Preferred imaging location?    Answer:   [facility]  . CBC with Differential (Cancer Center Only)    Standing Status:   Standing    Number of Occurrences:   50    Standing Expiration Date:   01/05/2023  . CMP ([facility] only)    Standing Status:   Standing    Number of Occurrences:   50    Standing Expiration Date:   01/05/2023    All questions were answered. The patient knows to call the clinic with any problems, questions or concerns. I spent 25 minutes counseling the patient face to face. The total time spent in the appointment was 30 minutes and more than 50% was on counseling.  Oneal Deputy, am acting as scribe for Truitt Merle, MD.    I have reviewed the above documentation for accuracy and completeness, and I agree with the above.      Truitt Merle, MD 01/04/2018

## 2018-01-04 ENCOUNTER — Encounter: Payer: Self-pay | Admitting: Hematology

## 2018-01-04 ENCOUNTER — Ambulatory Visit
Admission: RE | Admit: 2018-01-04 | Discharge: 2018-01-04 | Disposition: A | Discharge status: Home / Self Care | Payer: Medicare Other | Source: Ambulatory Visit | Attending: Radiation Oncology | Admitting: Radiation Oncology

## 2018-01-04 ENCOUNTER — Inpatient Hospital Stay: Payer: Medicare Other | Attending: Hematology | Admitting: Hematology

## 2018-01-04 VITALS — BP 142/87 | HR 64 | Resp 18 | Ht 63.0 in | Wt 123.2 lb

## 2018-01-04 DIAGNOSIS — Z809 Family history of malignant neoplasm, unspecified: Secondary | ICD-10-CM | POA: Diagnosis not present

## 2018-01-04 DIAGNOSIS — Z87891 Personal history of nicotine dependence: Secondary | ICD-10-CM

## 2018-01-04 DIAGNOSIS — E2839 Other primary ovarian failure: Secondary | ICD-10-CM

## 2018-01-04 DIAGNOSIS — Z510 Encounter for antineoplastic radiation therapy: Secondary | ICD-10-CM | POA: Diagnosis not present

## 2018-01-04 DIAGNOSIS — Z79899 Other long term (current) drug therapy: Secondary | ICD-10-CM | POA: Diagnosis not present

## 2018-01-04 DIAGNOSIS — Z803 Family history of malignant neoplasm of breast: Secondary | ICD-10-CM

## 2018-01-04 DIAGNOSIS — Z170 Estrogen receptor positive status [ER+]: Secondary | ICD-10-CM | POA: Diagnosis not present

## 2018-01-04 DIAGNOSIS — C50212 Malignant neoplasm of upper-inner quadrant of left female breast: Secondary | ICD-10-CM

## 2018-01-04 DIAGNOSIS — Z923 Personal history of irradiation: Secondary | ICD-10-CM | POA: Diagnosis not present

## 2018-01-05 ENCOUNTER — Ambulatory Visit
Admission: RE | Admit: 2018-01-05 | Discharge: 2018-01-05 | Disposition: A | Discharge status: Home / Self Care | Payer: Medicare Other | Source: Ambulatory Visit | Attending: Radiation Oncology | Admitting: Radiation Oncology

## 2018-01-05 ENCOUNTER — Encounter: Payer: Self-pay | Admitting: Hematology

## 2018-01-05 DIAGNOSIS — Z510 Encounter for antineoplastic radiation therapy: Secondary | ICD-10-CM | POA: Diagnosis not present

## 2018-01-05 DIAGNOSIS — C50212 Malignant neoplasm of upper-inner quadrant of left female breast: Secondary | ICD-10-CM | POA: Diagnosis not present

## 2018-01-05 DIAGNOSIS — Z170 Estrogen receptor positive status [ER+]: Secondary | ICD-10-CM | POA: Diagnosis not present

## 2018-01-08 ENCOUNTER — Telehealth: Payer: Self-pay | Admitting: Family Medicine

## 2018-01-08 MED ORDER — TRETINOIN 0.1 % EX CREA: TOPICAL_CREAM | Freq: Every day | CUTANEOUS | 3 refills | Status: DC

## 2018-01-08 NOTE — Telephone Encounter (Signed)
LVM for pt to call the office. If pt calls, please give her the information below. Rainier Feuerborn T Nader Boys, CMA  

## 2018-01-08 NOTE — Telephone Encounter (Signed)
Patient dropped off letter asking for paper script for tretinoin. I am happy to do this. Please let her know it is up front for pickup as she requested. Please apologize for the delay, as the letter came in an envelope and it was not seen while I was on vacation last week. If she has more urgent concerns in the future, she can call or send a mychart message and the team will see it even if I'm on vacation.

## 2018-01-08 NOTE — Telephone Encounter (Signed)
Pt came and picked this up per the book up front. Tammy Levine, April D, [place]

## 2018-01-09 DIAGNOSIS — H938X3 Other specified disorders of ear, bilateral: Secondary | ICD-10-CM | POA: Diagnosis not present

## 2018-01-10 ENCOUNTER — Encounter: Payer: Self-pay | Admitting: Radiation Oncology

## 2018-01-10 NOTE — Progress Notes (Signed)
  Radiation Oncology         [phone] ________________________________  Name: Tammy Levine MRN: [id number]  Date: 01/10/2018  DOB: 10/12/1950  End of Treatment Note  Diagnosis:   StagepT2, cN0, cMo,LeftBreast, UIQ, Invasive Ductal Carcinoma, ER(+), PR(+), HER2(neg), grade2     Indication for treatment:  Curative       Radiation treatment dates:   12/11/17 - 01/05/18  Site/dose:  Total of 50.05 Gy in 20 fractions 1. Breast, Left/ 40.05 Gy delivered in 15 fractions of 2.67 Gy 2. Boost/ 10 Gy delivered in 5 fractions of 2 Gy  Beams/energy:    1. 3D, Photon/ 6X 2. Isodose Plan/ 6X  Narrative: The patient tolerated radiation treatment relatively well. She reported cording to the axilla that resolved over several days. By the end of treatments, she reported mild fatigue and slight sensitivity to the breast. Nurse note reported nipple was red and sensitive, and skin slightly hyperpigmented with rash appearance.    Plan: The patient has completed radiation treatment. The patient will return to radiation oncology clinic for routine followup in one month. I advised them to call or return sooner if they have any questions or concerns related to their recovery or treatment.  -----------------------------------  Blair Promise, PhD, MD  This document serves as a record of services personally performed by Gery Pray, MD. It was created on his behalf by Wilburn Mylar, a trained medical scribe. The creation of this record is based on the scribe's personal observations and the provider's statements to them. This document has been checked and approved by the attending provider.

## 2018-01-12 ENCOUNTER — Encounter: Payer: Self-pay | Admitting: Obstetrics & Gynecology

## 2018-01-12 ENCOUNTER — Other Ambulatory Visit (HOSPITAL_COMMUNITY)
Admission: RE | Admit: 2018-01-12 | Discharge: 2018-01-12 | Disposition: A | Discharge status: Home / Self Care | Payer: Medicare Other | Source: Ambulatory Visit | Attending: Family Medicine | Admitting: Family Medicine

## 2018-01-12 ENCOUNTER — Telehealth: Payer: Self-pay | Admitting: General Practice

## 2018-01-12 ENCOUNTER — Ambulatory Visit (INDEPENDENT_AMBULATORY_CARE_PROVIDER_SITE_OTHER): Payer: Medicare Other | Admitting: Obstetrics & Gynecology

## 2018-01-12 VITALS — BP 136/84 | HR 89 | Wt 122.1 lb

## 2018-01-12 DIAGNOSIS — Z01419 Encounter for gynecological examination (general) (routine) without abnormal findings: Secondary | ICD-10-CM

## 2018-01-12 DIAGNOSIS — N814 Uterovaginal prolapse, unspecified: Secondary | ICD-10-CM | POA: Diagnosis not present

## 2018-01-12 NOTE — Progress Notes (Signed)
History:  67 y.o. LMP 14 years prev.  G1P0010. EAB at age 73 years.Pt reports prolapse beginning in 2015. She noticed a bulge and felt the tissue low. The sx have been the same but, she now wants a pessary. The prolapse has not limited her lifestyle ,uch. It has not prolapsed out.    Pts mother had bladder prolapse. Pt does not lift heavily. Pt works out at [employer].  Pt recently completed radiation. Pt denies h/o TOB use.   Pt reports using a diaphragm for contraception last in the late 1990's.  Last PAP 2005.   Pt denies leakage of urine.   The following portions of the patient's history were reviewed and updated as appropriate: allergies, current medications, past family history, past medical history, past social history, past surgical history and problem list.  Review of Systems:  Pt recently has a lumpectomy- 11/01/2017     Objective:  Physical Exam Blood pressure 136/84, pulse 89, weight 122 lb 1.6 oz (55.4 kg), last menstrual period 03/21/2002.  CONSTITUTIONAL: Well-developed, well-nourished female in no acute distress.  HENT:  Normocephalic, atraumatic EYES: Conjunctivae and EOM are normal. No scleral icterus.  NECK: Normal range of motion SKIN: Skin is warm and dry. No rash noted. Not diaphoretic.No pallor. Rogers: Alert and oriented to person, place, and time. Normal coordination.  Lungs: CTA CV: RRR Abd: Soft, nontender and nondistended Pelvic: Normal appearing external genitalia; normal appearing vaginal mucosa and cervix.  Normal discharge.  Small uterus, no other palpable masses, no uterine or adnexal tenderness    Assessment & Plan:  Pelvic organ prolapse-   Discussed prolapse and related sx  Discussed pessary- after discission it was determined that pt did not need it.   F/u in 6 weeks  PAP  F/u PAP with HPV     Total face-to-face time with patient was 35 min.  Greater than 50% was spent in counseling and coordination of care with the patient.   Nanie Dunkleberger L.  Harraway-Smith, M.D., Cherlynn June

## 2018-01-12 NOTE — Addendum Note (Signed)
Addended by: Dolores Hoose on: 01/12/2018 11:19 AM   Modules accepted: Orders

## 2018-01-12 NOTE — Patient Instructions (Signed)
Pelvic Organ Prolapse Pelvic organ prolapse is the stretching, bulging, or dropping of pelvic organs into an abnormal position. It happens when the muscles and tissues that surround and support pelvic structures are stretched or weak. Pelvic organ prolapse can involve:  Vagina (vaginal prolapse).  Uterus (uterine prolapse).  Bladder (cystocele).  Rectum (rectocele).  Intestines (enterocele).  When organs other than the vagina are involved, they often bulge into the vagina or protrude from the vagina, depending on how severe the prolapse is. What are the causes? Causes of this condition include:  Pregnancy, labor, and childbirth.  Long-lasting (chronic) cough.  Chronic constipation.  Obesity.  Past pelvic surgery.  Aging. During and after menopause, a decreased production of the hormone estrogen can weaken pelvic ligaments and muscles.  Consistently lifting more than 50 lb (23 kg).  Buildup of fluid in the abdomen due to certain diseases and other conditions.  What are the signs or symptoms? Symptoms of this condition include:  Loss of bladder control when you cough, sneeze, strain, and exercise (stress incontinence). This may be worse immediately following childbirth, and it may gradually improve over time.  Feeling pressure in your pelvis or vagina. This pressure may increase when you cough or when you are having a bowel movement.  A bulge that protrudes from the opening of your vagina or against your vaginal wall. If your uterus protrudes through the opening of your vagina and rubs against your clothing, you may also experience soreness, ulcers, infection, pain, and bleeding.  Increased effort to have a bowel movement or urinate.  Pain in your low back.  Pain, discomfort, or disinterest in sexual intercourse.  Repeated bladder infections (urinary tract infections).  Difficulty inserting or inability to insert a tampon or applicator.  In some people, this  condition does not cause any symptoms. How is this diagnosed? Your health care provider may perform an internal and external vaginal and rectal exam. During the exam, you may be asked to cough and strain while you are lying down, sitting, and standing up. Your health care provider will determine if other tests are required, such as bladder function tests. How is this treated? In most cases, this condition needs to be treated only if it produces symptoms. No treatment is guaranteed to correct the prolapse or relieve the symptoms completely. Treatment may include:  Lifestyle changes, such as: ? Avoiding drinking beverages that contain caffeine. ? Increasing your intake of high-fiber foods. This can help to decrease constipation and straining during bowel movements. ? Emptying your bladder at scheduled times (bladder training therapy). This can help to reduce or avoid urinary incontinence. ? Losing weight if you are overweight or obese.  Estrogen. Estrogen may help mild prolapse by increasing the strength and tone of pelvic floor muscles.  Kegel exercises. These may help mild cases of prolapse by strengthening and tightening the muscles of the pelvic floor.  Pessary insertion. A pessary is a soft, flexible device that is placed into your vagina by your health care provider to help support the vaginal walls and keep pelvic organs in place.  Surgery. This is often the only form of treatment for severe prolapse. Different types of surgeries are available.  Follow these instructions at home:  Wear a sanitary pad or absorbent product if you have urinary incontinence.  Avoid heavy lifting and straining with exercise and work. Do not hold your breath when you perform mild to moderate lifting and exercise activities. Limit your activities as directed by your health care   provider.  Take medicines only as directed by your health care provider.  Perform Kegel exercises as directed by your health care  provider.  If you have a pessary, take care of it as directed by your health care provider. Contact a health care provider if:  Your symptoms interfere with your daily activities or sex life.  You need medicine to help with the discomfort.  You notice bleeding from the vagina that is not related to your period.  You have a fever.  You have pain or bleeding when you urinate.  You have bleeding when you have a bowel movement.  You lose urine when you have sex.  You have chronic constipation.  You have a pessary that falls out.  You have vaginal discharge that has a bad smell.  You have low abdominal pain or cramping that is unusual for you. This information is not intended to replace advice given to you by your health care provider. Make sure you discuss any questions you have with your health care provider. Document Released: 10/02/2013 Document Revised: 08/13/2015 Document Reviewed: 05/20/2013 Elsevier Interactive Patient Education  2018 [employer]  

## 2018-01-12 NOTE — Telephone Encounter (Signed)
Opened in error

## 2018-01-16 LAB — CYTOLOGY - PAP
DIAGNOSIS: NEGATIVE
HPV: NOT DETECTED

## 2018-02-05 ENCOUNTER — Other Ambulatory Visit: Payer: Self-pay

## 2018-02-05 ENCOUNTER — Encounter: Payer: Self-pay | Admitting: Radiation Oncology

## 2018-02-05 ENCOUNTER — Ambulatory Visit
Admission: RE | Admit: 2018-02-05 | Discharge: 2018-02-05 | Disposition: A | Discharge status: Home / Self Care | Payer: Medicare Other | Source: Ambulatory Visit | Attending: Radiation Oncology | Admitting: Radiation Oncology

## 2018-02-05 VITALS — BP 142/87 | HR 68 | Resp 18 | Wt 122.4 lb

## 2018-02-05 DIAGNOSIS — Z923 Personal history of irradiation: Secondary | ICD-10-CM | POA: Insufficient documentation

## 2018-02-05 DIAGNOSIS — C50212 Malignant neoplasm of upper-inner quadrant of left female breast: Secondary | ICD-10-CM

## 2018-02-05 DIAGNOSIS — Z170 Estrogen receptor positive status [ER+]: Secondary | ICD-10-CM

## 2018-02-05 DIAGNOSIS — Z79899 Other long term (current) drug therapy: Secondary | ICD-10-CM | POA: Insufficient documentation

## 2018-02-05 NOTE — Progress Notes (Signed)
Radiation Oncology         [phone] ________________________________  Name: Tammy Levine MRN: [id number]  Date: 02/05/2018  DOB: 1950-07-25  Follow-Up Visit Note  CC: Sela Hilding, MD  Jovita Kussmaul, MD  No diagnosis found.  Diagnosis:   StagepT2, cN0, cMo,LeftBreast, UIQ, Invasive Ductal Carcinoma, ER(+), PR(+), HER2(neg), grade2   Interval Since Last Radiation:  1 months  12/11/17-01/05/18  Site/dose:  Total of 50.05 Gy in 20 fractions 1. Breast, Left/ 40.05 Gy delivered in 15 fractions of 2.67 Gy 2. Boost/ 10 Gy delivered in 5 fractions of 2 Gy  Narrative:  The patient returns today for routine follow-up.  she reports doing very well overall. She has been having minor irritation in her left breast following recent radiation.   Since she were last seen in the office, she had a Pap smear on 01/12/18 that was negative for intraepithelial lesions or malignancy.  On review of systems, she reports some tenderness, sensitivity, and minor nerve irritation in her breast. she denies breast pain and any other symptoms. Pertinent positives are listed and detailed within the above HPI.                 ALLERGIES:  has No Known Allergies.  Meds: Current Outpatient Medications  Medication Sig Dispense Refill  . cetirizine (ZYRTEC) 10 MG tablet Take 1 tablet (10 mg total) by mouth daily. 30 tablet 11  . ibuprofen (ADVIL,MOTRIN) 200 MG tablet Take 200 mg by mouth every 6 (six) hours as needed.    Marland Kitchen losartan (COZAAR) 25 MG tablet Take 1 tablet (25 mg total) by mouth daily. 90 tablet 3  . Multiple Vitamin (MULTIVITAMIN) tablet Take 1 tablet by mouth daily.    Marland Kitchen tretinoin (RETIN-A) 0.1 % cream Apply topically at bedtime. 45 g 3  . vitamin C (ASCORBIC ACID) 500 MG tablet Take 500 mg by mouth daily.    . vitamin E 100 UNIT capsule Take by mouth daily.    Marland Kitchen HYDROcodone-acetaminophen (NORCO/VICODIN) 5-325 MG tablet Take 1-2 tablets by mouth every 6 (six) hours as needed for  moderate pain or severe pain. (Patient not taking: Reported on 01/04/2018) 15 tablet 0   No current facility-administered medications for this encounter.     Physical Findings: The patient is in no acute distress. Patient is alert and oriented.  weight is 122 lb 6.4 oz (55.5 kg). Her blood pressure is 142/87 (abnormal) and her pulse is 68. Her respiration is 18 and oxygen saturation is 98%. .  No significant changes.  Lungs are clear to auscultation bilaterally. Heart has regular rate and rhythm. No palpable cervical, supraclavicular, or axillary adenopathy. Abdomen soft, non-tender, normal bowel sounds. Right breast no palpable mass, nipple discharge or bleeding. Left breast pt's skin has healed well; mild hyperpigmentation changes, mild swelling in breast, no dominant mass, nipple discharge or bleeding.  Lab Findings: Lab Results  Component Value Date   WBC 7.5 10/18/2017   HGB 13.7 10/18/2017   HCT 42.8 10/18/2017   MCV 84.2 10/18/2017   PLT 299 10/18/2017    Radiographic Findings: No results found.  Impression:  StagepT2, cN0, cMo,LeftBreast, UIQ, Invasive Ductal Carcinoma, ER(+), PR(+), HER2(neg), grade2  The patient is recovering from the effects of radiation.  No evidence of recurrence on clinical exam.  Plan:  Patient will follow-up with her surgeon in March and follow-up with radiation oncology in June. She will undergo bone density test in January and then be placed on anti-estrogens.   ____________________________________  Blair Promise, PhD, MD    This document serves as a record of services personally performed by Gery Pray, MD. It was created on his behalf by Mary-Margaret Loma Messing, a trained medical scribe. The creation of this record is based on the scribe's personal observations and the provider's statements to them. This document has been checked and approved by the attending provider.

## 2018-02-05 NOTE — Progress Notes (Signed)
Pt presents today for one month f/u with Dr. Sondra Come. Pt is unaccompanied. Pt reports fatigue has resolved. Pt reports frequent "sensation of sensitivity" to breast, but does not describe feeling as pain. Pt is using Radiaplex to breast once daily. Pt axilla is hyperpigmented, which is reported as fading. Pt reports that approximately 2 weeks ago that incision and nipple were red but has since faded.   BP (!) 142/87 (Patient Position: Sitting)   Pulse 68   Resp 18   Wt 122 lb 6.4 oz (55.5 kg)   LMP 03/21/2002   SpO2 98%   BMI 21.68 kg/m   Wt Readings from Last 3 Encounters:  02/05/18 122 lb 6.4 oz (55.5 kg)  01/12/18 122 lb 1.6 oz (55.4 kg)  01/04/18 123 lb 3.2 oz (55.9 kg)   Loma Sousa, RN BSN

## 2018-03-22 ENCOUNTER — Telehealth: Payer: Self-pay | Admitting: Hematology

## 2018-03-22 NOTE — Telephone Encounter (Signed)
Called to cancel will reschedule with Lacie upon return in April

## 2018-03-28 ENCOUNTER — Ambulatory Visit
Admission: RE | Admit: 2018-03-28 | Discharge: 2018-03-28 | Disposition: A | Discharge status: Home / Self Care | Payer: Medicare Other | Source: Ambulatory Visit | Attending: Hematology | Admitting: Hematology

## 2018-03-28 DIAGNOSIS — M810 Age-related osteoporosis without current pathological fracture: Secondary | ICD-10-CM | POA: Diagnosis not present

## 2018-03-28 DIAGNOSIS — Z780 Asymptomatic menopausal state: Secondary | ICD-10-CM | POA: Diagnosis not present

## 2018-03-28 DIAGNOSIS — E2839 Other primary ovarian failure: Secondary | ICD-10-CM

## 2018-03-29 ENCOUNTER — Other Ambulatory Visit: Payer: Self-pay | Admitting: Hematology

## 2018-03-29 ENCOUNTER — Telehealth: Payer: Self-pay | Admitting: Hematology

## 2018-03-29 DIAGNOSIS — Z170 Estrogen receptor positive status [ER+]: Secondary | ICD-10-CM | POA: Diagnosis not present

## 2018-03-29 DIAGNOSIS — C50212 Malignant neoplasm of upper-inner quadrant of left female breast: Secondary | ICD-10-CM | POA: Diagnosis not present

## 2018-03-29 MED ORDER — TAMOXIFEN CITRATE 20 MG PO TABS: 20.0000 mg | ORAL_TABLET | Freq: Every day | ORAL | 3 refills | Status: DC

## 2018-03-29 NOTE — Telephone Encounter (Signed)
I called patient, and discussed his DEXA scan results from yesterday.  It unfortunately showed osteoporosis with T score -3.6.  She is at high risk for fractures.  Due to the severe osteoporosis, I recommend her to start tamoxifen, instead of aromatase inhibitor, potential benefit and side effects of tamoxifen reviewed with her again, she agrees to proceed. I called in to her pharmacy today and she will start in a few days.  I also encouraged her to start taking calcium with vitamin D supplement, in addition to her multivitamins.  I will discuss biphosphonate with her on her next visit.  She knows to call me if she has side effects from tamoxifen before her next visit in April.   Truitt Merle  03/29/2018

## 2018-04-12 ENCOUNTER — Encounter: Payer: Medicare Other | Admitting: Adult Health

## 2018-04-12 ENCOUNTER — Other Ambulatory Visit: Payer: Medicare Other

## 2018-05-28 ENCOUNTER — Other Ambulatory Visit: Payer: Self-pay

## 2018-05-28 MED ORDER — CETIRIZINE HCL 10 MG PO TABS: 10.0000 mg | ORAL_TABLET | Freq: Every day | ORAL | 11 refills | Status: DC

## 2018-06-27 ENCOUNTER — Telehealth: Payer: Self-pay | Admitting: Hematology

## 2018-06-27 NOTE — Telephone Encounter (Signed)
Called patient and mover her appt from the 9 of April to the 15th.  Patient is aware that the appt is a phone visit.

## 2018-06-30 ENCOUNTER — Other Ambulatory Visit: Payer: Self-pay | Admitting: Hematology

## 2018-07-04 ENCOUNTER — Telehealth: Payer: Self-pay

## 2018-07-04 ENCOUNTER — Inpatient Hospital Stay: Payer: Medicare Other | Admitting: Hematology

## 2018-07-04 NOTE — Telephone Encounter (Signed)
Spoke to patient to let her know Dr. Burr Medico would be delayed in calling her.  She states she would really like an actual appointment.  Informed her that would not be a problem and I have sent a scheduling message for lab and f/u in one month.

## 2018-07-05 ENCOUNTER — Ambulatory Visit: Payer: Medicare Other | Admitting: Hematology

## 2018-07-05 ENCOUNTER — Telehealth: Payer: Self-pay | Admitting: Hematology

## 2018-07-05 ENCOUNTER — Other Ambulatory Visit: Payer: Medicare Other

## 2018-07-05 NOTE — Telephone Encounter (Signed)
Scheduled appt per 4/15 sch message- unable to reach patient - left message and mailed letter

## 2018-07-06 ENCOUNTER — Telehealth: Payer: Self-pay | Admitting: Hematology

## 2018-07-06 NOTE — Telephone Encounter (Signed)
Left message re rescheduleing January SCP visit via webex 4/29.

## 2018-07-06 NOTE — Telephone Encounter (Signed)
Patient called to get verification of her appts and wanted to make sure she will be physically see Dr.Feng in May.  Patient also stated she will be waiting to hear from someone to see if she will be able to set up her Web-ex for her appt with Lacie.

## 2018-07-12 NOTE — Progress Notes (Signed)
I connected with Tammy Levine on 07/18/18 at 10:15 by WebEx and verified that I am speaking with the correct person using two identifiers. The patient was alone for the encounter.  I discussed the limitations, risks, security and privacy concerns of performing an evaluation and management service by telephone and the availability of in person appointments. I also discussed with the patient that there may be a patient responsible charge related to this service. The patient expressed understanding and agreed to proceed.   Patient location: her home Provider location: [place] office   REASON FOR VISIT: Survivorship care plan    BRIEF ONCOLOGIC HISTORY:  Oncology History   Cancer Staging Malignant neoplasm of upper-inner quadrant of left breast in female, estrogen receptor positive ([place]) Staging form: Breast, AJCC 8th Edition - Clinical stage from 10/12/2017: Stage IB (cT2, cN0, cM0, G2, ER+, PR+, HER2-) - Signed by Truitt Merle, MD on 10/18/2017 - Pathologic stage from 11/01/2017: Stage IA (pT2, pN0, cM0, G3, ER+, PR+, HER2-, Oncotype DX score: 2) - Signed by Truitt Merle, MD on 01/03/2018       Malignant neoplasm of upper-inner quadrant of left breast in female, estrogen receptor positive ([place])   10/09/2017 Mammogram    Diagnostic Mammogram 10/09/17 IMPRESSION: 3.2 x 3.0 x 2.0 cm mass with imaging features highly suspicious for malignancy in the 11 o'clock position of the left breast.    10/11/2017 Initial Biopsy    Diagnosis 10/11/17 Breast, left, needle core biopsy, upper inner 11 o'clock - INVASIVE DUCTAL CARCINOMA WITH PAPILLARY FEATURES, SEE COMMENT.     10/11/2017 Receptors her2    Estrogen Receptor: 100%, POSITIVE, STRONG STAINING INTENSITY Progesterone Receptor: 80%, POSITIVE, STRONG STAINING INTENSITY Proliferation Marker Ki67: 15% HER2 - NEGATIVE    10/12/2017 Cancer Staging    Staging form: Breast, AJCC 8th Edition - Clinical stage from 10/12/2017: Stage IB (cT2, cN0, cM0, G2, ER+,  PR+, HER2-) - Signed by Truitt Merle, MD on 10/18/2017    10/17/2017 Initial Diagnosis    Malignant neoplasm of upper-inner quadrant of left breast in female, estrogen receptor positive ([place])    11/01/2017 Surgery     She had left breast lumpectomy with Dr. Marlou Starks on 11/01/2017    11/01/2017 Pathology Results    11/01/2017 Surgical Pathology Diagnosis 1. Lymph node, sentinel, biopsy, Left - NO CARCINOMA IDENTIFIED IN ONE LYMPH NODE (0/1) 2. Lymph node, sentinel, biopsy, Left - NO CARCINOMA IDENTIFIED IN ONE LYMPH NODE (0/1) 3. Lymph node, sentinel, biopsy, Left - NO CARCINOMA IDENTIFIED IN ONE LYMPH NODE (0/1) 4. Breast, lumpectomy, Left - INVASIVE MAMMARY CARCINOMA, WITH PAPILLARY AND MICROPAPILLARY FEATURES, NOTTINGHAM GRADE 3 OF 3, 3.1 CM - MARGINS UNINVOLVED BY CARCINOMA (0.2 CM; POSTERIOR MARGIN) - SEE ONCOLOGY TABLE AND COMMENT BELOW    11/01/2017 Oncotype testing    RS Score 2 Distant recurrence risk at 9 years 3% Group average absolute chemo benefit <1%    11/01/2017 Cancer Staging    Staging form: Breast, AJCC 8th Edition - Pathologic stage from 11/01/2017: Stage IA (pT2, pN0, cM0, G3, ER+, PR+, HER2-, Oncotype DX score: 2) - Signed by Truitt Merle, MD on 01/03/2018    12/12/2017 - 01/05/2018 Radiation Therapy    Daily radiation therapy with Dr. Sondra Come Radiation treatment dates:   12/11/17 - 01/05/18  Site/dose:  Total of 50.05 Gy in 20 fractions 1. Breast, Left/ 40.05 Gy delivered in 15 fractions of 2.67 Gy 2. Boost/ 10 Gy delivered in 5 fractions of 2 Gy  Beams/energy:    1.  3D, Photon/ 6X 2. Isodose Plan/ 6X  Narrative: The patient tolerated radiation treatment relatively well. She reported cording to the axilla that resolved over several days. By the end of treatments, she reported mild fatigue and slight sensitivity to the breast. Nurse note reported nipple was red and sensitive, and skin slightly hyperpigmented with rash appearance.      03/2018 -  Anti-estrogen oral  therapy    Tamoxifen '20mg'$  dialy starting 03/2018    07/18/2018 Survivorship    SCP per Cira Rue, NP      INTERVAL HISTORY:  Tammy Levine to review her survivorship care plan detailing her treatment course for breast cancer, as well as monitoring long-term side effects of that treatment, education regarding health maintenance, screening, and overall wellness and health promotion.     Overall, Tammy Levine reports feeling quite well since completing her radiation therapy approximately 6 months ago.  She tolerated radiation well. She denies breast swelling, skin discoloration or irritation. She is currently taking tamoxifen which she began in 03/2018. She is tolerating well overall, her only complaint is vaginal moisture which is bothersome. She also notes having bladder prolapse. She has seen her gynecologist about this but no intervention has been done. She reports having vaginal spotting over a 10 day time but none in over a week. Denies changes in her breasts. She performs self exams. She has slight intermittent cough. Mood is good. Denies bone/joint pain. Denies mood changes. Denies leg or arm swelling. Denies fever, chills, chest pain, dyspnea, n/v/c/d, decreased appetite, fatigue, or weight loss. [facility], jogs, and attends classes at [facility].    REVIEW OF SYSTEMS:  Review of Systems  Constitutional: Negative for appetite change, fatigue, fever and unexpected weight change.  Respiratory: Positive for cough. Negative for chest tightness and shortness of breath.   Cardiovascular: Negative for chest pain and leg swelling.  Gastrointestinal: Negative for constipation, diarrhea, nausea and vomiting.  Genitourinary: Positive for vaginal bleeding and vaginal discharge.   Musculoskeletal: Negative for arthralgias.  Neurological: Negative for headaches.   Breast: Denies any new nodularity, masses, tenderness, nipple changes, or nipple discharge.    ONCOLOGY TREATMENT TEAM:   1. Surgeon:  Dr. Marlou Starks at [facility] 2. Medical Oncologist: Dr. Burr Medico 3. Radiation Oncologist: Dr. Sondra Come     PAST MEDICAL/SURGICAL HISTORY:  Past Medical History:  Diagnosis Date  . Breast cancer, left ([place]) 09/2017  . Hypertension    Past Surgical History:  Procedure Laterality Date  . BLEPHAROPLASTY     lower eyelid  . BREAST LUMPECTOMY WITH AXILLARY LYMPH NODE BIOPSY Left 11/01/2017   Procedure: BREAST LUMPECTOMY WITH SENTINEL LYMPH NODE MAPPING;  Surgeon: Jovita Kussmaul, MD;  Location: [facility];  Service: General;  Laterality: Left;     ALLERGIES:  No Known Allergies   CURRENT MEDICATIONS:  Outpatient Encounter Medications as of 07/18/2018  Medication Sig  . losartan (COZAAR) 25 MG tablet Take 1 tablet (25 mg total) by mouth daily.  . tamoxifen (NOLVADEX) 20 MG tablet TAKE 1 TABLET BY MOUTH EVERY DAY  . vitamin C (ASCORBIC ACID) 500 MG tablet Take 500 mg by mouth daily.  . vitamin E 100 UNIT capsule Take by mouth daily.  . cetirizine (ZYRTEC) 10 MG tablet Take 1 tablet (10 mg total) by mouth daily.  Marland Kitchen HYDROcodone-acetaminophen (NORCO/VICODIN) 5-325 MG tablet Take 1-2 tablets by mouth every 6 (six) hours as needed for moderate pain or severe pain. (Patient not taking: Reported on  01/04/2018)  . ibuprofen (ADVIL,MOTRIN) 200 MG tablet Take 200 mg by mouth every 6 (six) hours as needed.  . Multiple Vitamin (MULTIVITAMIN) tablet Take 1 tablet by mouth daily.  Marland Kitchen tretinoin (RETIN-A) 0.1 % cream Apply topically at bedtime.   No facility-administered encounter medications on file as of 07/18/2018.      ONCOLOGIC FAMILY HISTORY:  Family History  Problem Relation Age of Onset  . Cancer Mother        breast cancer?   . Chronic Renal Failure Mother   . Chronic Renal Failure Father   . Breast cancer Paternal Grandfather   . Breast cancer Cousin      SOCIAL HISTORY:  Social History   Socioeconomic History  . Marital status: Single    Spouse  name: Not on file  . Number of children: Not on file  . Years of education: Not on file  . Highest education level: Not on file  Occupational History  . Not on file  Social Needs  . Financial resource strain: Not on file  . Food insecurity:    Worry: Not on file    Inability: Not on file  . Transportation needs:    Medical: Not on file    Non-medical: Not on file  Tobacco Use  . Smoking status: Never Smoker  . Smokeless tobacco: Never Used  Substance and Sexual Activity  . Alcohol use: Yes    Comment: occasionally  . Drug use: Yes  . Sexual activity: Not on file  Lifestyle  . Physical activity:    Days per week: Not on file    Minutes per session: Not on file  . Stress: Not on file  Relationships  . Social connections:    Talks on phone: Not on file    Gets together: Not on file    Attends religious service: Not on file    Active member of club or organization: Not on file    Attends meetings of clubs or organizations: Not on file    Relationship status: Not on file  . Intimate partner violence:    Fear of current or ex partner: Not on file    Emotionally abused: Not on file    Physically abused: Not on file    Forced sexual activity: Not on file  Other Topics Concern  . Not on file  Social History Narrative  . Not on file     OBSERVATIONS/OBJECTIVE:  Patient appears well. No obvious rash on face or neck. Respiratory effort is normal. She did not cough. Speech is clear and non-pressured. Neuro grossly normal.  LABORATORY DATA:  None for this visit.  DIAGNOSTIC IMAGING:  None for this visit.    ASSESSMENT AND PLAN:  Ms.. Hamme is a pleasant 68 y.o. female with Stage IA left breast invasive ductal carcinoma, ER+/PR+/HER2-, diagnosed in 09/2017, treated with lumpectomy, adjuvant radiation therapy, and anti-estrogen therapy with Tamoxifen beginning in 03/2018.  She presents to the Survivorship Clinic for our initial meeting and routine follow-up post-completion  of treatment for breast cancer.    1. Stage IA left breast cancer:  Ms. Dittrich is continuing to recover from definitive treatment for breast cancer. She will follow-up with her medical oncologist, Dr. Burr Medico in 1 month for an in-office visit with history and physical exam per surveillance protocol.  She will continue her anti-estrogen therapy with Tamoxifen. Thus far, she is tolerating it well overall with few side effects. She was instructed to make Dr. Burr Medico or myself aware  if she begins to experience any worsening side effects of the medication and I could see her back in clinic to help manage those side effects, as needed. Her mammogram is due 09/2018; orders placed today.  Her breast density is category C; she has dense breast tissue. Today, a comprehensive survivorship care plan and treatment summary was reviewed with the patient today detailing her breast cancer diagnosis, treatment course, potential late/long-term effects of treatment, appropriate follow-up care with recommendations for the future, and patient education resources.  A copy of this summary, along with a letter will be sent to the patient's primary care provider via mail/fax/In Basket message after today's visit.    #. Problem(s) at Visit -Vaginal moisture likely secondary to tamoxifen and complicated by pelvic organ prolapse. She has seen GYN about this, no intervention has been done at this point. She will f/u with GYN. I informed her that if a surgical procedure is necessary she will need to hold tamoxifen for approx 2 weeks prior due to increased risk of blood clots. She may resume after. She understands.  -Vaginal spotting - she is postmenopausal. Occurred once over 10 days period of time. Last GYN visit in 12/2017. I strongly recommend she f/u with GYN as tamoxifen carries increased risk of endometrial cancer and any vaginal bleeding after menopause should be addressed. She understands.    #. Bone health:  Her baseline DEXA in  03/28/2018 showed osteoporosis at the spine with T-score of -3.6. Dr. Burr Medico placed her on Tamoxifen rather than AI due to this finding and her high risk of fracture and further bone demineralization on AI. For severe osteoporosis I recommend treatment with bisphosphonate, she is not interested. I informed her of her high risk for fracture, she understands. She was encouraged to increase her consumption of foods rich in calcium, as well as increase her weight-bearing activities.  She was given education on specific activities to promote bone health. She currently takes calcium and vitamin D supplement and I encouraged her to continue.   #. Cancer screening:  Due to Ms. Musto's history and her age, she should receive screening for skin cancers, colon cancer, and gynecologic cancers.  Last colonoscopy was in [place] a few years ago and she is on 10-year recall. I do not have those records. I urged her to have pelvic exam annually while on tamoxifen. The information and recommendations are listed on the patient's comprehensive care plan/treatment summary and were reviewed in detail with the patient.    #. Health maintenance and wellness promotion: Ms. Venezia was encouraged to consume 5-7 servings of fruits and vegetables per day. We reviewed the "Nutrition Rainbow" handout, as well as the handout "Take Control of Your Health and Reduce Your Cancer Risk" from the [place].  She was also encouraged to engage in moderate to vigorous exercise for 30 minutes per day most days of the week. We discussed the [employer] fitness program, which is designed for cancer survivors to help them become more physically fit after cancer treatments.  She was instructed to limit her alcohol consumption and continue to abstain from tobacco use.     #. Support services/counseling: It is not uncommon for this period of the patient's cancer care trajectory to be one of many emotions and stressors.  We discussed how this can  be increasingly difficult during the times of quarantine and social distancing due to the COVID-19 pandemic.   She was given information regarding our available services and encouraged to  contact me with any questions or for help enrolling in any of our support group/programs.    Follow up instructions:  -Return to cancer center in 1 month for f/u with Dr. Burr Medico -Mammogram due in 09/2018, order placed today -F/u with GYN for vaginal spotting  -Follow up with your breast surgeon surgery as indicated, last f/u in 03/2018 -follow up with radiation oncology 08/2018 as scheduled  -She is welcome to return back to the Survivorship Clinic at any time; no additional follow-up needed at this time.  -Consider referral back to survivorship as a long-term survivor for continued surveillance The patient was provided an opportunity to ask questions and all were answered. The patient agreed with the plan and demonstrated an understanding of the instructions.  The patient was advised to call back or seek an in-person evaluation if the symptoms worsen or if the condition fails to improve as anticipated.   I provided 25 minutes of non-face-to-face time during this encounter.   Alla Feeling, NP

## 2018-07-16 ENCOUNTER — Telehealth: Payer: Self-pay | Admitting: Nurse Practitioner

## 2018-07-16 NOTE — Telephone Encounter (Signed)
Called regarding Webex appointment, screen test complete and e-mail has been sent.

## 2018-07-18 ENCOUNTER — Inpatient Hospital Stay: Payer: Medicare Other | Attending: Nurse Practitioner | Admitting: Nurse Practitioner

## 2018-07-18 DIAGNOSIS — M810 Age-related osteoporosis without current pathological fracture: Secondary | ICD-10-CM | POA: Diagnosis not present

## 2018-07-18 DIAGNOSIS — Z170 Estrogen receptor positive status [ER+]: Secondary | ICD-10-CM | POA: Diagnosis not present

## 2018-07-18 DIAGNOSIS — C50212 Malignant neoplasm of upper-inner quadrant of left female breast: Secondary | ICD-10-CM | POA: Diagnosis not present

## 2018-07-19 ENCOUNTER — Telehealth: Payer: Self-pay | Admitting: Nurse Practitioner

## 2018-07-19 NOTE — Telephone Encounter (Signed)
No los per 4/29. °

## 2018-08-03 ENCOUNTER — Telehealth: Payer: Self-pay | Admitting: Hematology

## 2018-08-03 NOTE — Telephone Encounter (Signed)
Patient declined to move 5/20 appointments to 6/22 same day as visit with Dr. Sondra Come. Appointments for 5/20 remain as scheduled.  Message to [place].

## 2018-08-06 NOTE — Progress Notes (Signed)
[place]   [phone] [phone]) [phone]   Clinic Follow up Note   Patient Care Team: Sela Hilding, MD as PCP - General (Family Medicine) Jovita Kussmaul, MD as Consulting Physician (General Surgery) Truitt Merle, MD as Consulting Physician (Hematology) Gery Pray, MD as Consulting Physician (Radiation Oncology) Alla Feeling, NP as Nurse Practitioner (Nurse Practitioner)  Date of Service:  08/08/2018  CHIEF COMPLAINT: F/u of left breast cancer and vaginal discharge  SUMMARY OF ONCOLOGIC HISTORY: Oncology History   Cancer Staging Malignant neoplasm of upper-inner quadrant of left breast in female, estrogen receptor positive ([place]) Staging form: Breast, AJCC 8th Edition - Clinical stage from 10/12/2017: Stage IB (cT2, cN0, cM0, G2, ER+, PR+, HER2-) - Signed by Truitt Merle, MD on 10/18/2017 - Pathologic stage from 11/01/2017: Stage IA (pT2, pN0, cM0, G3, ER+, PR+, HER2-, Oncotype DX score: 2) - Signed by Truitt Merle, MD on 01/03/2018       Malignant neoplasm of upper-inner quadrant of left breast in female, estrogen receptor positive ([place])   10/09/2017 Mammogram    Diagnostic Mammogram 10/09/17 IMPRESSION: 3.2 x 3.0 x 2.0 cm mass with imaging features highly suspicious for malignancy in the 11 o'clock position of the left breast.    10/11/2017 Initial Biopsy    Diagnosis 10/11/17 Breast, left, needle core biopsy, upper inner 11 o'clock - INVASIVE DUCTAL CARCINOMA WITH PAPILLARY FEATURES, SEE COMMENT.     10/11/2017 Receptors her2    Estrogen Receptor: 100%, POSITIVE, STRONG STAINING INTENSITY Progesterone Receptor: 80%, POSITIVE, STRONG STAINING INTENSITY Proliferation Marker Ki67: 15% HER2 - NEGATIVE    10/12/2017 Cancer Staging    Staging form: Breast, AJCC 8th Edition - Clinical stage from 10/12/2017: Stage IB (cT2, cN0, cM0, G2, ER+, PR+, HER2-) - Signed by Truitt Merle, MD on 10/18/2017    10/17/2017 Initial Diagnosis    Malignant neoplasm of  upper-inner quadrant of left breast in female, estrogen receptor positive ([place])    11/01/2017 Surgery     She had left breast lumpectomy with Dr. Marlou Starks on 11/01/2017    11/01/2017 Pathology Results    11/01/2017 Surgical Pathology Diagnosis 1. Lymph node, sentinel, biopsy, Left - NO CARCINOMA IDENTIFIED IN ONE LYMPH NODE (0/1) 2. Lymph node, sentinel, biopsy, Left - NO CARCINOMA IDENTIFIED IN ONE LYMPH NODE (0/1) 3. Lymph node, sentinel, biopsy, Left - NO CARCINOMA IDENTIFIED IN ONE LYMPH NODE (0/1) 4. Breast, lumpectomy, Left - INVASIVE MAMMARY CARCINOMA, WITH PAPILLARY AND MICROPAPILLARY FEATURES, NOTTINGHAM GRADE 3 OF 3, 3.1 CM - MARGINS UNINVOLVED BY CARCINOMA (0.2 CM; POSTERIOR MARGIN) - SEE ONCOLOGY TABLE AND COMMENT BELOW    11/01/2017 Oncotype testing    RS Score 2 Distant recurrence risk at 9 years 3% Group average absolute chemo benefit <1%    11/01/2017 Cancer Staging    Staging form: Breast, AJCC 8th Edition - Pathologic stage from 11/01/2017: Stage IA (pT2, pN0, cM0, G3, ER+, PR+, HER2-, Oncotype DX score: 2) - Signed by Truitt Merle, MD on 01/03/2018    12/12/2017 - 01/05/2018 Radiation Therapy    Daily radiation therapy with Dr. Sondra Come Radiation treatment dates:   12/11/17 - 01/05/18  Site/dose:  Total of 50.05 Gy in 20 fractions 1. Breast, Left/ 40.05 Gy delivered in 15 fractions of 2.67 Gy 2. Boost/ 10 Gy delivered in 5 fractions of 2 Gy  Beams/energy:    1. 3D, Photon/ 6X 2. Isodose Plan/ 6X  Narrative: The patient tolerated radiation treatment relatively well. She reported cording to the axilla that  resolved over several days. By the end of treatments, she reported mild fatigue and slight sensitivity to the breast. Nurse note reported nipple was red and sensitive, and skin slightly hyperpigmented with rash appearance.      03/2018 -  Anti-estrogen oral therapy    Tamoxifen 35m dialy starting 03/2018    07/18/2018 Survivorship    SCP per LCira Rue NP        CURRENT THERAPY:  Tamoxifen 270mdaily starting 03/2018. D/c on 08/08/18 due to vaginal discharge. Start Anastrozole in 08/2018.    INTERVAL HISTORY:  Tammy Fullenwiders here for a follow up of left breast cancer. She is here alone. She went to survivorship recently and found it 50% helpful. She notes in 06/2018 she started feeling aching in her joints which she attributed to Losartan and has resolved. She mainly now has vaginal clear-cloudy discharge and has mild uterine or bladder prolapse.The sensation is uncomfortable and reduced her activity. This has been going on for 1 month now. She has not seen anyone about these symptoms. She had 1 pregnancy that was aborted but no child births. She does note she does pelvic floor exercise. She is interested in changing her anti-estrogen therapy and starting Zometa.  She notes mild tightness of left axilla and breast at times.    REVIEW OF SYSTEMS:   Constitutional: Denies fevers, chills or abnormal weight loss Eyes: Denies blurriness of vision Ears, nose, mouth, throat, and face: Denies mucositis or sore throat Respiratory: Denies cough, dyspnea or wheezes Cardiovascular: Denies palpitation, chest discomfort or lower extremity swelling Gastrointestinal:  Denies nausea, heartburn or change in bowel habits UA: (+) Vaginal discharge and uterine or bladder prolapse Skin: Denies abnormal skin rashes Lymphatics: Denies new lymphadenopathy or easy bruising Neurological:Denies numbness, tingling or new weaknesses Behavioral/Psych: Mood is stable, no new changes  BREAST: (+) Mild tightness of left breast and axilla  All other systems were reviewed with the patient and are negative.  MEDICAL HISTORY:  Past Medical History:  Diagnosis Date  . Breast cancer, left ([id number]/2019  . Hypertension     SURGICAL HISTORY: Past Surgical History:  Procedure Laterality Date  . BLEPHAROPLASTY     lower eyelid  . BREAST LUMPECTOMY WITH AXILLARY LYMPH NODE BIOPSY  Left 11/01/2017   Procedure: BREAST LUMPECTOMY WITH SENTINEL LYMPH NODE MAPPING;  Surgeon: ToJovita KussmaulMD;  [facility] Service: General;  Laterality: Left;    I have reviewed the social history and family history with the patient and they are unchanged from previous note.  ALLERGIES:  has No Known Allergies.  MEDICATIONS:  Current Outpatient Medications  Medication Sig Dispense Refill  . Calcium Carbonate-Vit D-Min (CALCIUM 1200) 1200-1000 MG-UNIT CHEW Chew by mouth.    . losartan (COZAAR) 25 MG tablet Take 1 tablet (25 mg total) by mouth daily. 90 tablet 3  . tamoxifen (NOLVADEX) 20 MG tablet TAKE 1 TABLET BY MOUTH EVERY DAY 90 tablet 1  . vitamin C (ASCORBIC ACID) 500 MG tablet Take 500 mg by mouth daily.    . Marland Kitchennastrozole (ARIMIDEX) 1 MG tablet Take 1 tablet (1 mg total) by mouth daily. 30 tablet 3  . ibuprofen (ADVIL,MOTRIN) 200 MG tablet Take 200 mg by mouth every 6 (six) hours as needed.     No current facility-administered medications for this visit.     PHYSICAL EXAMINATION: ECOG PERFORMANCE STATUS: 0 - Asymptomatic  Vitals:   08/08/18 1524  BP: (!) 158/86  Pulse: 68  Resp: 18  Temp: 97.6 F (36.4 C)  SpO2: 100%   Filed Weights   08/08/18 1524  Weight: 115 lb 6.4 oz (52.3 kg)    GENERAL:alert, no distress and comfortable SKIN: skin color, texture, turgor are normal, no rashes or significant lesions EYES: normal, Conjunctiva are pink and non-injected, sclera clear OROPHARYNX:no exudate, no erythema and lips, buccal mucosa, and tongue normal  NECK: supple, thyroid normal size, non-tender, without nodularity LYMPH:  no palpable lymphadenopathy in the cervical, axillary or inguinal LUNGS: clear to auscultation and percussion with normal breathing effort HEART: regular rate & rhythm and no murmurs and no lower extremity edema ABDOMEN:abdomen soft, non-tender and normal bowel sounds Musculoskeletal:no cyanosis of digits and no clubbing   NEURO: alert & oriented x 3 with fluent speech, no focal motor/sensory deficits BREAST: S/p left breast lumpectomy: Surgical incision healed well, minimal scar tissue (+) Skin color normal (+) No palpable mass and adenopathy   LABORATORY DATA:  I have reviewed the data as listed CBC Latest Ref Rng & Units 08/08/2018 10/18/2017  WBC 4.0 - 10.5 K/uL 6.8 7.5  Hemoglobin 12.0 - 15.0 g/dL 12.3 13.7  Hematocrit 36.0 - 46.0 % 40.4 42.8  Platelets 150 - 400 K/uL 222 299     CMP Latest Ref Rng & Units 08/08/2018 12/26/2017 12/01/2017  Glucose 70 - 99 mg/dL 115(H) 99 95  BUN 8 - 23 mg/dL _0 Creatinine 0.44 - 1.00 mg/dL 0.89 0.75 0.66  Sodium 135 - 145 mmol/L 142 142 141  Potassium 3.5 - 5.1 mmol/L 3.7 3.9 4.5  Chloride 98 - 111 mmol/L 106 104 104  CO2 22 - 32 mmol/L _1 Calcium 8.9 - 10.3 mg/dL 8.8(L) 9.2 9.3  Total Protein 6.5 - 8.1 g/dL 7.0 - -  Total Bilirubin 0.3 - 1.2 mg/dL 0.3 - -  Alkaline Phos 38 - 126 U/L 53 - -  AST 15 - 41 U/L 20 - -  ALT 0 - 44 U/L 16 - -      RADIOGRAPHIC STUDIES: I have personally reviewed the radiological images as listed and agreed with the findings in the report. No results found.   ASSESSMENT & PLAN:  Elisandra Deshmukh is a 68 y.o. female with   1. Malignant neoplasm of upper-inner quadrant of left breast, Stage IA, pT2N0M0, ER/PR: (+), HER2 (-), Grade III, RS 2 -She was diagnosed in 09/2017. She is s/p left breast lumpectomy and adjuvant radiation.  -Her Oncotype report which showed low risk disease with recurrence score of 2 and with anti-estrogen therapy alone there is a distant recurrence risk of 3% in the next 9 years. There is less than 1% benefit of adjuvant chemotherapy, so I do not recommend chemotherapy.  -She started anti-estrogen therapy with Tamoxifen in 03/2018, tolerated well innitially. -Starting in April she developed vaginal discharge and slight of uterine or bladder prolapse, some days more than others. She is concerned because  this effects her activity level and would like to stop tamoxifen.   -I reviewed her risk of recurrence. Given her T2 lesion, Grade 3 disease, although Oncotype showed low risk disease, I encouraged her to continue antiestrogen therapy, but switch to AI.  --The potential benefit and side effects, which includes but not limited to, hot flash, skin and vaginal dryness, metabolic changes ( increased blood glucose, cholesterol, weight, etc.), slightly in increased risk of cardiovascular disease, cataracts, muscular and joint discomfort, osteopenia and osteoporosis, etc, were discussed with her in great details.  She is interested, and we'll start next month -She will stop Tamoxifen and be off for 3-4 weeks and she will start with Anastrozole and Bisphosphonate. -She otherwise is clinically doing well. Physical exam unremarkable. CBC and CMP WNL except BG 115, Calcium 8.8. Next mammogram in 09/2018.  -F/u in 3 months   2. Osteoporosis  -Pt notes her last bone density scan was many years ago and normal.  -Her 03/2018 DEXA shows osteoporosis with lowest T-score of -3.6 at [facility]. She is at high risk for fracture.  -She will continue calcium and Vitamin d -She is on Tamoxifen which will help strengthen her bone. Given concern with vaginal discharge she is open to switching to AI and starting bisphosphonate. She is interested.  -I recommended Zometa infusion every 6 months for 2 years. I reviewed side effects of bone pain, infusion reaction, and small risk of jaw necrosis etc. I encouraged her to maintain good dental hygiene and follow ups. She is agreeable. Plan to start in 10/2018.    3. Elevated BP and hyperglycemia -At initial consult her BP and BG were elevated.  -I suggest she check BP at home when she is more relaxed -I suggest her to f/u with PCP and test for HbA1c.  -BP at 158/86 today (08/08/18). F/u with PCP    PLAN: -She will stop Tamoxifen due to AEs -I called in Anastrozole to start in  3-4 weeks  -Lab, f/u and Zometa infusion in 3 months     No problem-specific Assessment & Plan notes found for this encounter.   No orders of the defined types were placed in this encounter.  All questions were answered. The patient knows to call the clinic with any problems, questions or concerns. No barriers to learning was detected. I spent 20 minutes counseling the patient face to face. The total time spent in the appointment was 25 minutes and more than 50% was on counseling and review of test results     Truitt Merle, MD 08/08/2018   I, Joslyn Devon, am acting as scribe for Truitt Merle, MD.   I have reviewed the above documentation for accuracy and completeness, and I agree with the above.

## 2018-08-08 ENCOUNTER — Encounter: Payer: Self-pay | Admitting: Hematology

## 2018-08-08 ENCOUNTER — Other Ambulatory Visit: Payer: Self-pay

## 2018-08-08 ENCOUNTER — Inpatient Hospital Stay: Payer: Medicare Other | Attending: Nurse Practitioner

## 2018-08-08 ENCOUNTER — Inpatient Hospital Stay (HOSPITAL_BASED_OUTPATIENT_CLINIC_OR_DEPARTMENT_OTHER): Payer: Medicare Other | Admitting: Hematology

## 2018-08-08 VITALS — BP 158/86 | HR 68 | Temp 97.6°F | Resp 18 | Ht 63.0 in | Wt 115.4 lb

## 2018-08-08 DIAGNOSIS — Z79810 Long term (current) use of selective estrogen receptor modulators (SERMs): Secondary | ICD-10-CM

## 2018-08-08 DIAGNOSIS — M810 Age-related osteoporosis without current pathological fracture: Secondary | ICD-10-CM | POA: Diagnosis not present

## 2018-08-08 DIAGNOSIS — E1165 Type 2 diabetes mellitus with hyperglycemia: Secondary | ICD-10-CM | POA: Insufficient documentation

## 2018-08-08 DIAGNOSIS — C50212 Malignant neoplasm of upper-inner quadrant of left female breast: Secondary | ICD-10-CM

## 2018-08-08 DIAGNOSIS — I10 Essential (primary) hypertension: Secondary | ICD-10-CM | POA: Insufficient documentation

## 2018-08-08 DIAGNOSIS — Z79899 Other long term (current) drug therapy: Secondary | ICD-10-CM | POA: Diagnosis not present

## 2018-08-08 DIAGNOSIS — Z170 Estrogen receptor positive status [ER+]: Secondary | ICD-10-CM

## 2018-08-08 LAB — CMP (CANCER CENTER ONLY)
ALT: 16 U/L (ref 0–44)
AST: 20 U/L (ref 15–41)
Albumin: 3.8 g/dL (ref 3.5–5.0)
Alkaline Phosphatase: 53 U/L (ref 38–126)
Anion gap: 8 (ref 5–15)
BUN: 21 mg/dL (ref 8–23)
CO2: 28 mmol/L (ref 22–32)
Calcium: 8.8 mg/dL — ABNORMAL LOW (ref 8.9–10.3)
Chloride: 106 mmol/L (ref 98–111)
Creatinine: 0.89 mg/dL (ref 0.44–1.00)
GFR, Est AFR Am: >60 mL/min (ref >60)
GFR, Estimated: >60 mL/min (ref >60)
Glucose, Bld: 115 mg/dL — ABNORMAL HIGH (ref 70–99)
Potassium: 3.7 mmol/L (ref 3.5–5.1)
Sodium: 142 mmol/L (ref 135–145)
Total Bilirubin: 0.3 mg/dL (ref 0.3–1.2)
Total Protein: 7 g/dL (ref 6.5–8.1)

## 2018-08-08 LAB — CBC WITH DIFFERENTIAL (CANCER CENTER ONLY)
Abs Immature Granulocytes: 0.01 10*3/uL (ref 0.00–0.07)
Basophils Absolute: 0.1 10*3/uL (ref 0.0–0.1)
Basophils Relative: 1 %
Eosinophils Absolute: 0.2 10*3/uL (ref 0.0–0.5)
Eosinophils Relative: 3 %
HCT: 40.4 % (ref 36.0–46.0)
Hemoglobin: 12.3 g/dL (ref 12.0–15.0)
Immature Granulocytes: 0 %
Lymphocytes Relative: 22 %
Lymphs Abs: 1.5 10*3/uL (ref 0.7–4.0)
MCH: 26.8 pg (ref 26.0–34.0)
MCHC: 30.4 g/dL (ref 30.0–36.0)
MCV: 88 fL (ref 80.0–100.0)
Monocytes Absolute: 0.5 10*3/uL (ref 0.1–1.0)
Monocytes Relative: 7 %
Neutro Abs: 4.6 10*3/uL (ref 1.7–7.7)
Neutrophils Relative %: 67 %
Platelet Count: 222 10*3/uL (ref 150–400)
RBC: 4.59 MIL/uL (ref 3.87–5.11)
RDW: 13.4 % (ref 11.5–15.5)
WBC Count: 6.8 10*3/uL (ref 4.0–10.5)
nRBC: 0 % (ref 0.0–0.2)

## 2018-08-08 MED ORDER — ANASTROZOLE 1 MG PO TABS: 1.0000 mg | ORAL_TABLET | Freq: Every day | ORAL | 3 refills | Status: DC

## 2018-08-09 ENCOUNTER — Telehealth: Payer: Self-pay | Admitting: Hematology

## 2018-08-09 NOTE — Telephone Encounter (Signed)
Scheduled appt per 5/20 los. A calendar will be mailed out. °

## 2018-08-16 ENCOUNTER — Encounter (HOSPITAL_COMMUNITY): Payer: Self-pay

## 2018-08-16 ENCOUNTER — Other Ambulatory Visit: Payer: Self-pay

## 2018-08-16 ENCOUNTER — Ambulatory Visit (HOSPITAL_COMMUNITY)
Admission: EM | Admit: 2018-08-16 | Discharge: 2018-08-16 | Disposition: A | Discharge status: Home / Self Care | Payer: Medicare Other | Attending: Family Medicine | Admitting: Family Medicine

## 2018-08-16 DIAGNOSIS — N3090 Cystitis, unspecified without hematuria: Secondary | ICD-10-CM

## 2018-08-16 DIAGNOSIS — I10 Essential (primary) hypertension: Secondary | ICD-10-CM

## 2018-08-16 LAB — POCT URINALYSIS DIP (DEVICE)
Bilirubin Urine: NEGATIVE
Glucose, UA: 100 mg/dL — AB
Hgb urine dipstick: NEGATIVE
Ketones, ur: NEGATIVE mg/dL
Nitrite: POSITIVE — AB
Protein, ur: NEGATIVE mg/dL
Specific Gravity, Urine: 1.01 (ref 1.005–1.030)
Urobilinogen, UA: 1 mg/dL (ref 0.0–1.0)
pH: 6 (ref 5.0–8.0)

## 2018-08-16 MED ORDER — CEPHALEXIN 500 MG PO CAPS: 500.0000 mg | ORAL_CAPSULE | Freq: Two times a day (BID) | ORAL | 0 refills | Status: DC

## 2018-08-16 NOTE — ED Triage Notes (Signed)
Pt presents with complaints of frequent urination x 2 days.

## 2018-08-17 ENCOUNTER — Emergency Department (HOSPITAL_COMMUNITY)
Admission: EM | Admit: 2018-08-17 | Discharge: 2018-08-17 | Disposition: A | Discharge status: Home / Self Care | Payer: Medicare Other | Attending: Emergency Medicine | Admitting: Emergency Medicine

## 2018-08-17 ENCOUNTER — Other Ambulatory Visit: Payer: Self-pay

## 2018-08-17 ENCOUNTER — Encounter (HOSPITAL_COMMUNITY): Payer: Self-pay

## 2018-08-17 DIAGNOSIS — Z79899 Other long term (current) drug therapy: Secondary | ICD-10-CM | POA: Diagnosis not present

## 2018-08-17 DIAGNOSIS — R339 Retention of urine, unspecified: Secondary | ICD-10-CM | POA: Diagnosis not present

## 2018-08-17 DIAGNOSIS — N3000 Acute cystitis without hematuria: Secondary | ICD-10-CM

## 2018-08-17 DIAGNOSIS — I10 Essential (primary) hypertension: Secondary | ICD-10-CM | POA: Diagnosis not present

## 2018-08-17 DIAGNOSIS — R338 Other retention of urine: Secondary | ICD-10-CM

## 2018-08-17 LAB — URINALYSIS, ROUTINE W REFLEX MICROSCOPIC
Bilirubin Urine: NEGATIVE
Glucose, UA: NEGATIVE mg/dL
Hgb urine dipstick: NEGATIVE
Ketones, ur: NEGATIVE mg/dL
Nitrite: POSITIVE — AB
Protein, ur: NEGATIVE mg/dL
Specific Gravity, Urine: 1.009 (ref 1.005–1.030)
pH: 6 (ref 5.0–8.0)

## 2018-08-17 LAB — URINE CULTURE: Culture: NO GROWTH

## 2018-08-17 MED ORDER — CEPHALEXIN 500 MG PO CAPS: 500.0000 mg | ORAL_CAPSULE | Freq: Two times a day (BID) | ORAL | 0 refills | Status: DC

## 2018-08-17 NOTE — ED Notes (Signed)
MD Little bladder scanned patient. 571mL urine noted by Dr. Rex Kras.

## 2018-08-17 NOTE — ED Notes (Signed)
Discharge instructions reviewed with patient. Patient verbalizes understanding. VSS.   Patient discharged with leg bag and foley bag. Patient instructed on foley catheter care, changing bags, and peri care. Patient verbalizes understanding.

## 2018-08-17 NOTE — ED Notes (Signed)
MD Little at bedside. 

## 2018-08-17 NOTE — Discharge Instructions (Signed)
Fill 2nd prescription for keflex and start taking after 1st prescription is finished--to total 7 days of keflex. Follow up with urology. Return to ER if severe abdominal/back pain, fever, vomiting, or urine not draining into bag.

## 2018-08-17 NOTE — ED Provider Notes (Signed)
[facility] DEPT Provider Note   CSN: [id number] Arrival date & time: 08/17/18  1628    History   Chief Complaint No chief complaint on file.   HPI Tammy Levine is a 68 y.o. female.     68yo F w/ PMH including HTN, breast CA, bladder prolapse who p/w urinary retention. A few days ago, she began having urinary frequency and urgency. She started taking AZO and eventually went to UC yesterday, where she was diagnosed w/ UTI and started on keflex. She has taken the medication as prescribed. She notes that this morning she voided normally, but since then she has only been able to dribble and has not been able to empty her bladder. She has noticed progressively worsening lower abdominal distension. She denies significant pain. No back pain, fevers, vomiting, or other complaints. She has seen GYN in the past, was diagnosed w/ bladder prolapse but has not required any treatment for it as she has been asymptomatic until now.   The history is provided by the patient.    Past Medical History:  Diagnosis Date  . Breast cancer, left ([place]) 09/2017  . Hypertension     Patient Active Problem List   Diagnosis Date Noted  . Osteoporosis 08/08/2018  . Essential hypertension 11/24/2017  . Elevated blood sugar 11/24/2017  . Malignant neoplasm of upper-inner quadrant of left breast in female, estrogen receptor positive ([place]) 10/17/2017  . Breast mass, left 10/05/2017  . Uterine prolapse 10/05/2017  . Ear fullness, bilateral 10/05/2017    Past Surgical History:  Procedure Laterality Date  . BLEPHAROPLASTY     lower eyelid  . BREAST LUMPECTOMY WITH AXILLARY LYMPH NODE BIOPSY Left 11/01/2017   Procedure: BREAST LUMPECTOMY WITH SENTINEL LYMPH NODE MAPPING;  Surgeon: Jovita Kussmaul, MD;  Location: [facility];  Service: General;  Laterality: Left;     OB History   No obstetric history on file.      Home Medications    Prior to Admission  medications   Medication Sig Start Date End Date Taking? Authorizing Provider  anastrozole (ARIMIDEX) 1 MG tablet Take 1 tablet (1 mg total) by mouth daily. 08/08/18   Truitt Merle, MD  Calcium Carbonate-Vit D-Min (CALCIUM 1200) 1200-1000 MG-UNIT CHEW Chew by mouth.    [provider]  cephALEXin (KEFLEX) 500 MG capsule Take 1 capsule (500 mg total) by mouth 2 (two) times daily. 08/16/18   Vanessa Kick, MD  cephALEXin (KEFLEX) 500 MG capsule Take 1 capsule (500 mg total) by mouth 2 (two) times daily. 08/17/18   Ferris Fielden, Wenda Overland, MD  ibuprofen (ADVIL,MOTRIN) 200 MG tablet Take 200 mg by mouth every 6 (six) hours as needed.    [provider]  losartan (COZAAR) 25 MG tablet Take 1 tablet (25 mg total) by mouth daily. 12/26/17   Sela Hilding, MD  vitamin C (ASCORBIC ACID) 500 MG tablet Take 500 mg by mouth daily.    [provider]    Family History Family History  Problem Relation Age of Onset  . Cancer Mother        breast cancer?   . Chronic Renal Failure Mother   . Chronic Renal Failure Father   . Breast cancer Paternal Grandfather   . Breast cancer Cousin     Social History Social History   Tobacco Use  . Smoking status: Never Smoker  . Smokeless tobacco: Never Used  Substance Use Topics  . Alcohol use: Yes    Comment:  occasionally  . Drug use: Never     Allergies   Patient has no known allergies.   Review of Systems Review of Systems All other systems reviewed and are negative except that which was mentioned in HPI  Physical Exam Updated Vital Signs BP (!) 162/96 (BP Location: Right Arm)   Pulse 61   Temp 98.7 F (37.1 C) (Oral)   Resp 18   Ht 5\' 3"  (1.6 m)   Wt 52.2 kg   LMP 03/21/2002   SpO2 100%   BMI 20.37 kg/m   Physical Exam Vitals signs and nursing note reviewed.  Constitutional:      General: She is not in acute distress.    Appearance: She is well-developed.  HENT:     Head: Normocephalic and atraumatic.   Eyes:     Conjunctiva/sclera: Conjunctivae normal.  Neck:     Musculoskeletal: Neck supple.  Pulmonary:     Effort: Pulmonary effort is normal.  Abdominal:     General: There is distension (suprapubic).     Palpations: Abdomen is soft.     Tenderness: There is no abdominal tenderness.     Comments: Distended bladder in suprapubic abd, non-tender  Musculoskeletal:     Right lower leg: No edema.     Left lower leg: No edema.  Skin:    General: Skin is warm and dry.  Neurological:     Mental Status: She is alert and oriented to person, place, and time.     Gait: Gait normal.  Psychiatric:        Judgment: Judgment normal.      ED Treatments / Results  Labs (all labs ordered are listed, but only abnormal results are displayed) Labs Reviewed  URINALYSIS, ROUTINE W REFLEX MICROSCOPIC - Abnormal; Notable for the following components:      Result Value   Color, Urine AMBER (*)    Nitrite POSITIVE (*)    Leukocytes,Ua MODERATE (*)    Bacteria, UA RARE (*)    All other components within normal limits  URINE CULTURE    EKG None  Radiology No results found.  Procedures Procedures (including critical care time) EMERGENCY DEPARTMENT ULTRASOUND  Study: Limited Ultrasound of Bladder  INDICATIONS: to assess for urinary retention and/or bladder volume prior to urinary catheter Multiple views of the bladder were obtained in real-time in the transverse and longitudinal planes with a multi-frequency probe.  PERFORMED BY: Myself IMAGES ARCHIVED?: Yes LIMITATIONS:  none INTERPRETATION: Large Volume Approx 55ml urine c/w acute urinary retention      Medications Ordered in ED Medications - No data to display   Initial Impression / Assessment and Plan / ED Course  I have reviewed the triage vital signs and the nursing notes.  Pertinent labs & imaging results that were available during my care of the patient were reviewed by me and considered in my medical decision making  (see chart for details).        Bedside US confirms acute urinary retention. Foley catheter placed. No signs/sx suggestive of pyelonephritis. Given she's only taken 2 doses of keflex, I feel it's reasonable to continue this medication. Gave extra keflex to complete a total of 7 days. Will give urology f/u information, or she can f/u with urogynecologist at her GYN practice. Discussed foley care and return precautions. She voiced understanding.  Final Clinical Impressions(s) / ED Diagnoses   Final diagnoses:  Acute urinary retention  Acute cystitis without hematuria    ED Discharge Orders  Ordered    cephALEXin (KEFLEX) 500 MG capsule  2 times daily     08/17/18 1821           Alivea Gladson, Wenda Overland, MD 08/17/18 [phone]

## 2018-08-17 NOTE — ED Triage Notes (Signed)
Patient reports that she went to an UC yesterday and was told she had a UTI ,was given an antibiotic. Patient states she felt better. Patient states this afternoon she has only been dribbling and has a  distended bladder. Patient states she last urinated at 1000 today.

## 2018-08-19 LAB — URINE CULTURE
Culture: NO GROWTH
Special Requests: NORMAL

## 2018-08-20 ENCOUNTER — Telehealth (HOSPITAL_COMMUNITY): Payer: Self-pay | Admitting: Emergency Medicine

## 2018-08-21 NOTE — ED Provider Notes (Signed)
[facility]    ASSESSMENT & PLAN:  1. Cystitis   2. Essential hypertension     Meds ordered this encounter  Medications  . cephALEXin (KEFLEX) 500 MG capsule    Sig: Take 1 capsule (500 mg total) by mouth 2 (two) times daily.    Dispense:  10 capsule    Refill:  0   No signs of pyelonephritis. Discussed. Urine culture sent. Will notify patient when results available. Monitor BP. Will follow up with her PCP or here if not showing improvement over the next 48 hours, sooner if needed.  Outlined signs and symptoms indicating need for more acute intervention. Patient verbalized understanding. After Visit Summary given.  SUBJECTIVE:  Tammy Levine is a 69 y.o. female who complains of urinary frequency, urgency and dysuria for the past two days. Without associated flank pain, fever, chills, genitourinary discharge or gross bleeding. Hematuria: not seen. Normal PO intake. Without specific abdominal pain. No self treatment reported. Ambulatory without difficulty.  LMP: Patient's last menstrual period was 03/21/2002.  Increased blood pressure noted today. Reports that she has been treated for hypertension in the past.  She reports no chest pain on exertion, no dyspnea on exertion, no swelling of ankles, no orthostatic dizziness or lightheadedness, no orthopnea or paroxysmal nocturnal dyspnea, no palpitations and no intermittent claudication symptoms. Has not taken BP meds today.  ROS: As in HPI. All other systems negative.  OBJECTIVE:  Vitals:   08/16/18 1710  BP: (!) 183/95  Pulse: 77  Resp: 18  Temp: 98 F (36.7 C)  SpO2: 98%   General appearance: alert; no distress HENT: oropharynx: moist Lungs: unlabored respirations Abdomen: soft, non-tender; bowel sounds normal; no masses or organomegaly; no guarding or rebound tenderness Back: no CVA tenderness Extremities: no edema; symmetrical with no gross deformities Skin: warm and dry Neurologic: normal gait  Psychological: alert and cooperative; normal mood and affect  Labs Reviewed  POCT URINALYSIS DIP (DEVICE) - Abnormal; Notable for the following components:      Result Value   Glucose, UA 100 (*)    Nitrite POSITIVE (*)    Leukocytes,Ua LARGE (*)    All other components within normal limits  URINE CULTURE    No Known Allergies  Past Medical History:  Diagnosis Date  . Breast cancer, left ([place]) 09/2017  . Hypertension    Social History   Socioeconomic History  . Marital status: Single    Spouse name: Not on file  . Number of children: Not on file  . Years of education: Not on file  . Highest education level: Not on file  Occupational History  . Not on file  Social Needs  . Financial resource strain: Not on file  . Food insecurity:    Worry: Not on file    Inability: Not on file  . Transportation needs:    Medical: Not on file    Non-medical: Not on file  Tobacco Use  . Smoking status: Never Smoker  . Smokeless tobacco: Never Used  Substance and Sexual Activity  . Alcohol use: Yes    Comment: occasionally  . Drug use: Never  . Sexual activity: Not on file  Lifestyle  . Physical activity:    Days per week: Not on file    Minutes per session: Not on file  . Stress: Not on file  Relationships  . Social connections:    Talks on phone: Not on file    Gets together: Not on file  Attends religious service: Not on file    Active member of club or organization: Not on file    Attends meetings of clubs or organizations: Not on file    Relationship status: Not on file  . Intimate partner violence:    Fear of current or ex partner: Not on file    Emotionally abused: Not on file    Physically abused: Not on file    Forced sexual activity: Not on file  Other Topics Concern  . Not on file  Social History Narrative  . Not on file   Family History  Problem Relation Age of Onset  . Cancer Mother        breast cancer?   . Chronic Renal Failure Mother   . Chronic  Renal Failure Father   . Breast cancer Paternal Grandfather   . Breast cancer Tyler Aas, MD 08/21/18 [phone]

## 2018-08-23 DIAGNOSIS — N952 Postmenopausal atrophic vaginitis: Secondary | ICD-10-CM | POA: Diagnosis not present

## 2018-08-23 DIAGNOSIS — N813 Complete uterovaginal prolapse: Secondary | ICD-10-CM | POA: Diagnosis not present

## 2018-08-23 DIAGNOSIS — R3914 Feeling of incomplete bladder emptying: Secondary | ICD-10-CM | POA: Diagnosis not present

## 2018-09-10 ENCOUNTER — Ambulatory Visit: Payer: Medicare Other | Admitting: Hematology

## 2018-09-10 ENCOUNTER — Ambulatory Visit
Admission: RE | Admit: 2018-09-10 | Discharge: 2018-09-10 | Disposition: A | Discharge status: Home / Self Care | Payer: Medicare Other | Source: Ambulatory Visit | Attending: Radiation Oncology | Admitting: Radiation Oncology

## 2018-09-10 ENCOUNTER — Other Ambulatory Visit: Payer: Self-pay

## 2018-09-10 ENCOUNTER — Encounter: Payer: Self-pay | Admitting: Radiation Oncology

## 2018-09-10 VITALS — BP 155/94 | HR 64 | Temp 99.1°F | Resp 18 | Wt 111.4 lb

## 2018-09-10 DIAGNOSIS — Z08 Encounter for follow-up examination after completed treatment for malignant neoplasm: Secondary | ICD-10-CM | POA: Diagnosis not present

## 2018-09-10 DIAGNOSIS — Z923 Personal history of irradiation: Secondary | ICD-10-CM | POA: Insufficient documentation

## 2018-09-10 DIAGNOSIS — Z79811 Long term (current) use of aromatase inhibitors: Secondary | ICD-10-CM | POA: Diagnosis not present

## 2018-09-10 DIAGNOSIS — Z170 Estrogen receptor positive status [ER+]: Secondary | ICD-10-CM | POA: Diagnosis not present

## 2018-09-10 DIAGNOSIS — Z79899 Other long term (current) drug therapy: Secondary | ICD-10-CM | POA: Diagnosis not present

## 2018-09-10 DIAGNOSIS — C50212 Malignant neoplasm of upper-inner quadrant of left female breast: Secondary | ICD-10-CM | POA: Insufficient documentation

## 2018-09-10 NOTE — Progress Notes (Signed)
Radiation Oncology         [phone] ________________________________  Name: Tammy Levine MRN: [id number]  Date: 09/10/2018  DOB: 1950/11/04  Follow-Up Visit Note  CC: Sela Hilding, MD  Jovita Kussmaul, MD    ICD-10-CM   1. Malignant neoplasm of upper-inner quadrant of left breast in female, estrogen receptor positive [facility])  C50.212    Z17.0     Diagnosis:   StagepT2, cN0, cMo,LeftBreast, UIQ, Invasive Ductal Carcinoma, ER(+), PR(+), HER2(neg), grade2   Interval Since Last Radiation:  8 months  12/11/17-01/05/18  Site/dose:  Total of 50.05 Gy in 20 fractions 1. Breast, Left/ 40.05 Gy delivered in 15 fractions of 2.67 Gy 2. Boost/ 10 Gy delivered in 5 fractions of 2 Gy  Narrative:  The patient returns today for routine follow-up.  She had a bone density test on 03/28/18 and then was placed on anti-estrogens.   Since her last visit, she visited the ED on 08/17/18 for urinary frequency, urgency and retention. She was diagnosed with UTI and prescribed Keflex.   On review of systems, she reports some tenderness, sensitivity, and minor nerve irritation in her breast. she denies breast pain and any other symptoms. Pertinent positives are listed and detailed within the above HPI.                  ALLERGIES:  has No Known Allergies.  Meds: Current Outpatient Medications  Medication Sig Dispense Refill  . Calcium Carbonate-Vit D-Min (CALCIUM 1200) 1200-1000 MG-UNIT CHEW Chew by mouth.    Marland Kitchen ibuprofen (ADVIL,MOTRIN) 200 MG tablet Take 200 mg by mouth every 6 (six) hours as needed.    Marland Kitchen losartan (COZAAR) 25 MG tablet Take 1 tablet (25 mg total) by mouth daily. 90 tablet 3  . naproxen sodium (ALEVE) 220 MG tablet Take 220 mg by mouth daily as needed.    . vitamin C (ASCORBIC ACID) 500 MG tablet Take 500 mg by mouth daily.    Marland Kitchen anastrozole (ARIMIDEX) 1 MG tablet Take 1 tablet (1 mg total) by mouth daily. (Patient not taking: Reported on 09/10/2018) 30 tablet 3  .  cephALEXin (KEFLEX) 500 MG capsule Take 1 capsule (500 mg total) by mouth 2 (two) times daily. (Patient not taking: Reported on 09/10/2018) 10 capsule 0  . cephALEXin (KEFLEX) 500 MG capsule Take 1 capsule (500 mg total) by mouth 2 (two) times daily. (Patient not taking: Reported on 09/10/2018) 4 capsule 0   No current facility-administered medications for this encounter.     Physical Findings: The patient is in no acute distress. Patient is alert and oriented.  weight is 111 lb 6.4 oz (50.5 kg). Her oral temperature is 99.1 F (37.3 C). Her blood pressure is 155/94 (abnormal) and her pulse is 64. Her respiration is 18 and oxygen saturation is 100%. .  No significant changes.  Lungs are clear to auscultation bilaterally. Heart has regular rate and rhythm. No palpable cervical, supraclavicular, or axillary adenopathy. Abdomen soft, non-tender, normal bowel sounds. Right breast no palpable mass, nipple discharge or bleeding. Left breast pt has a scar in the upper aspect of the breast, no palpable mass, nipple discharge or bleeding. Skin shows barely visible radiation changes, with a good cosmetic result.   Lab Findings: Lab Results  Component Value Date   WBC 6.8 08/08/2018   HGB 12.3 08/08/2018   HCT 40.4 08/08/2018   MCV 88.0 08/08/2018   PLT 222 08/08/2018    Radiographic Findings: No results found.  Impression:  No evidence of recurrence on clinical exam. Pt had problems with Tamoxifen causing urinary retention, and will switch to Anastrozole in July.   Plan:  PRN follow-up in radiation oncology. Pt will continue to follow-up with medical oncology and general surgery.    ____________________________________   Blair Promise, PhD, MD    This document serves as a record of services personally performed by Gery Pray, MD. It was created on his behalf by Mary-Margaret Loma Messing, a trained medical scribe. The creation of this record is based on the scribe's personal observations and the  provider's statements to them. This document has been checked and approved by the attending provider.

## 2018-09-10 NOTE — Progress Notes (Signed)
Pt presents today for f/u with Dr. Sondra Come. Pt denies c/o pain. Pt reports that she has discontinued Tamoxifen due to side effects. Pt states she will begin Anastrozole on September 19, 2018. Pt is using vitamin E oil and almond oil on breast. Pt reports skin WNL.   BP (!) 155/94 (BP Location: Right Arm, Patient Position: Sitting)   Pulse 64   Temp 99.1 F (37.3 C) (Oral)   Resp 18   Wt 111 lb 6.4 oz (50.5 kg)   LMP 03/21/2002   SpO2 100%   BMI 19.73 kg/m   Wt Readings from Last 3 Encounters:  09/10/18 111 lb 6.4 oz (50.5 kg)  08/17/18 115 lb (52.2 kg)  08/08/18 115 lb 6.4 oz (52.3 kg)   Loma Sousa, RN BSN

## 2018-09-10 NOTE — Patient Instructions (Signed)
Coronavirus (COVID-19) Are you at risk?  Are you at risk for the Coronavirus (COVID-19)?  To be considered HIGH RISK for Coronavirus (COVID-19), you have to meet the following criteria:  . Traveled to [place], [place], [name], [place] or [place]; or in the [place] to [place], [place], [place], or [place]; and have fever, cough, and shortness of breath within the last 2 weeks of travel OR . Been in close contact with a person diagnosed with COVID-19 within the last 2 weeks and have fever, cough, and shortness of breath . IF YOU DO NOT MEET THESE CRITERIA, YOU ARE CONSIDERED LOW RISK FOR COVID-19.  What to do if you are HIGH RISK for COVID-19?  . If you are having a medical emergency, call 911. . Seek medical care right away. Before you go to a doctor's office, urgent care or emergency department, call ahead and tell them about your recent travel, contact with someone diagnosed with COVID-19, and your symptoms. You should receive instructions from your physician's office regarding next steps of care.  . When you arrive at healthcare provider, tell the healthcare staff immediately you have returned from visiting [place], [place], [place], [place] or [name]; or traveled in the [place] to [place], [place], [place], or [place]; in the last two weeks or you have been in close contact with a person diagnosed with COVID-19 in the last 2 weeks.   . Tell the health care staff about your symptoms: fever, cough and shortness of breath. . After you have been seen by a medical provider, you will be either: o Tested for (COVID-19) and discharged home on quarantine except to seek medical care if symptoms worsen, and asked to  - Stay home and avoid contact with others until you get your results (4-5 days)  - Avoid travel on public transportation if possible (such as bus, train, or airplane) or o Sent to the Emergency Department by EMS for evaluation, COVID-19 testing, and possible  admission depending on your condition and test results.  What to do if you are LOW RISK for COVID-19?  Reduce your risk of any infection by using the same precautions used for avoiding the common cold or flu:  . Wash your hands often with soap and warm water for at least 20 seconds.  If soap and water are not readily available, use an alcohol-based hand sanitizer with at least 60% alcohol.  . If coughing or sneezing, cover your mouth and nose by coughing or sneezing into the elbow areas of your shirt or coat, into a tissue or into your sleeve (not your hands). . Avoid shaking hands with others and consider head nods or verbal greetings only. . Avoid touching your eyes, nose, or mouth with unwashed hands.  . Avoid close contact with people who are sick. . Avoid places or events with large numbers of people in one location, like concerts or sporting events. . Carefully consider travel plans you have or are making. . If you are planning any travel outside or inside the [place], visit the CDC's Travelers' Health webpage for the latest health notices. . If you have some symptoms but not all symptoms, continue to monitor at home and seek medical attention if your symptoms worsen. . If you are having a medical emergency, call 911.   ADDITIONAL HEALTHCARE OPTIONS FOR PATIENTS  [facility] / e-Visit: [email or web address]         [facility]: [phone]  [facility]: [phone]                   [facility]: [phone]   

## 2018-09-14 ENCOUNTER — Ambulatory Visit (INDEPENDENT_AMBULATORY_CARE_PROVIDER_SITE_OTHER): Payer: Medicare Other | Admitting: Family Medicine

## 2018-09-14 ENCOUNTER — Encounter: Payer: Self-pay | Admitting: Family Medicine

## 2018-09-14 ENCOUNTER — Other Ambulatory Visit: Payer: Self-pay

## 2018-09-14 VITALS — BP 130/90 | HR 70

## 2018-09-14 DIAGNOSIS — I10 Essential (primary) hypertension: Secondary | ICD-10-CM | POA: Diagnosis not present

## 2018-09-14 DIAGNOSIS — N814 Uterovaginal prolapse, unspecified: Secondary | ICD-10-CM | POA: Diagnosis not present

## 2018-09-14 NOTE — Assessment & Plan Note (Addendum)
On losartan 25, recent labs within normal limits.  Asked the patient to get a home blood pressure cuff and check her blood pressure and send [name] a message over the next week or so.  If persistently elevated, I would just increase her losartan.  She does get somewhat anxious at our office visits, and I wonder whether her blood pressure may be higher here than at home.

## 2018-09-14 NOTE — Patient Instructions (Addendum)
For the prolapse, I have placed another referral to a different gyn.   For your blood pressure, please get a blood pressure cuff to check your pressure about once per week and send a message.    If it stays up, we can increase your losartan.

## 2018-09-14 NOTE — Assessment & Plan Note (Signed)
Referred for second opinion, patient would like hyst. I think this is reasonable given prolapse likely causing retention. Placed referral.

## 2018-09-14 NOTE — Progress Notes (Signed)
   CC: f/u ED visit   HPI  Urinary retention - had ?dysuria and possible UTI, went to UC. Then the next day had acute urinary retention, went to ED and had foley placed. Since, has f/u'd with [facility], Dr. Tresa Moore. She passed voiding trial and he felt this was related to prolapse  Shes wonders if the urinary retention is due to tamoxifen, as this is one of the listed side effects. Onc is aware of her stopping the tamoxifen 2/2 SEs, and they have started anastrozole instead, she plans to start this next week.   Unable to exercise 2/2 prolapse and discomfort. She has times where the discharge 2/2 prolapse is bothersome. She is willing to have Hyst. Has questions about the logistics of this (open vs lap).   HTN - her BP was up at two recent visits, but both w stress and pain. Mildly elevated today, she is taking losartan and did today. Didn't get home cuff yet.   ROS: Denies CP, SOB, abdominal pain, dysuria, changes in BMs.   CC, SH/smoking status, and VS noted  Objective: BP 130/90   Pulse 70   LMP 03/21/2002   SpO2 99%  Gen: NAD, alert, cooperative, and pleasant. HEENT: NCAT, EOMI, PERRL Ext: No edema, warm Neuro: Alert and oriented, Speech clear, No gross deficits  Assessment and plan:  Essential hypertension On losartan 25, recent labs within normal limits.  Asked the patient to get a home blood pressure cuff and check her blood pressure and send [name] a message over the next week or so.  If persistently elevated, I would just increase her losartan.  She does get somewhat anxious at our office visits, and I wonder whether her blood pressure may be higher here than at home.  Uterine prolapse Referred for second opinion, patient would like hyst. I think this is reasonable given prolapse likely causing retention. Placed referral.    Orders Placed This Encounter  Procedures  . Ambulatory referral to Obstetrics / Gynecology    Referral Priority:   Routine    Referral Type:    Consultation    Referral Reason:   Specialty Services Required    Requested Specialty:   Obstetrics and Gynecology    Number of Visits Requested:   1    No orders of the defined types were placed in this encounter.   Ralene Ok, MD, PGY3 09/14/2018 3:05 PM

## 2018-10-02 ENCOUNTER — Other Ambulatory Visit: Payer: Self-pay

## 2018-10-03 ENCOUNTER — Encounter: Payer: Self-pay | Admitting: Gynecology

## 2018-10-03 ENCOUNTER — Ambulatory Visit (INDEPENDENT_AMBULATORY_CARE_PROVIDER_SITE_OTHER): Payer: Medicare Other | Admitting: Gynecology

## 2018-10-03 VITALS — BP 120/78 | Ht 62.5 in | Wt 111.0 lb

## 2018-10-03 DIAGNOSIS — N952 Postmenopausal atrophic vaginitis: Secondary | ICD-10-CM | POA: Diagnosis not present

## 2018-10-03 DIAGNOSIS — Z4689 Encounter for fitting and adjustment of other specified devices: Secondary | ICD-10-CM | POA: Diagnosis not present

## 2018-10-03 DIAGNOSIS — N814 Uterovaginal prolapse, unspecified: Secondary | ICD-10-CM | POA: Diagnosis not present

## 2018-10-03 NOTE — Patient Instructions (Signed)
Office will call you when the pessary is available for placement

## 2018-10-03 NOTE — Progress Notes (Signed)
    Tammy Levine [birth year] [id number]        68 y.o.  G1P0010 new patient presents complaining of uterine prolapse.  She has a history of the past year feeling more pressure and feeling something is protruding at her vaginal opening particularly while jogging or doing strenuous activities.  Had an issue of urinary retention requiring a catheter in May attributable to her prolapse.  Saw Dr. Ihor Dow this past fall where her exam did not show significant prolapse and at that point no intervention was done.  She is not having issues as far as urinary incontinence or issues with constipation or diarrhea.  Does have a history of breast cancer currently on anastrozole.  Pap smear/HPV 12/2017 was normal  Past medical history,surgical history, problem list, medications, allergies, family history and social history were all reviewed and documented in the EPIC chart.  Directed ROS with pertinent positives and negatives documented in the history of present illness/assessment and plan.  Exam: [name] assistant Vitals:   10/03/18 1056  BP: 120/78  Weight: 111 lb (50.3 kg)  Height: 5' 2.5" (1.588 m)   General appearance:  Normal Abdomen soft nontender without masses guarding rebound Pelvic external BUS vagina with atrophic changes.  Cervix with atrophic changes.  Cervix within 1 to 2 fingerbreadths of the introitus with straining although is relatively low in the vaginal canal due to significant retroversion.  Uterus grossly normal size midline mobile retroverted.  Rectal exam without significant rectocele  Assessment/Plan:  68 y.o. G1P0010 with complaints of prolapse.  Cervix does telescope in the vaginal canal with straining but remains within 1 to 2 fingerbreadths of the introital opening.  Starts lower due to her retroversion.  No significant cystocele or rectocele.  No significant urinary incontinence.  We reviewed the anatomy of the pelvis.  We discussed options to include observation,  trial of pessary and surgery.  The pros and cons of each choice reviewed.  Ultimately we fitted her with a #2 ring pessary with support.  She did well with this.  We will go ahead and order her this pessary and she will return in a week or so when it is available for placement.  We will start conservatively and see how well she does with this.  She is not sexually active and has used a diaphragm for contraception in the past and is comfortable with this.  30 minutes of direct face-to-face time, as well as review of her records and fitting of her pessary.  Anastasio Auerbach MD, 11:43 AM 10/03/2018

## 2018-10-11 ENCOUNTER — Other Ambulatory Visit: Payer: Self-pay

## 2018-10-11 ENCOUNTER — Ambulatory Visit
Admission: RE | Admit: 2018-10-11 | Discharge: 2018-10-11 | Disposition: A | Discharge status: Home / Self Care | Payer: Medicare Other | Source: Ambulatory Visit | Attending: Nurse Practitioner | Admitting: Nurse Practitioner

## 2018-10-11 DIAGNOSIS — R922 Inconclusive mammogram: Secondary | ICD-10-CM | POA: Diagnosis not present

## 2018-10-11 DIAGNOSIS — Z853 Personal history of malignant neoplasm of breast: Secondary | ICD-10-CM | POA: Diagnosis not present

## 2018-10-11 DIAGNOSIS — C50212 Malignant neoplasm of upper-inner quadrant of left female breast: Secondary | ICD-10-CM

## 2018-10-11 HISTORY — DX: Personal history of irradiation: Z92.3

## 2018-10-12 ENCOUNTER — Ambulatory Visit (INDEPENDENT_AMBULATORY_CARE_PROVIDER_SITE_OTHER): Payer: Medicare Other | Admitting: Gynecology

## 2018-10-12 ENCOUNTER — Encounter: Payer: Self-pay | Admitting: Gynecology

## 2018-10-12 VITALS — BP 120/78

## 2018-10-12 DIAGNOSIS — Z4689 Encounter for fitting and adjustment of other specified devices: Secondary | ICD-10-CM

## 2018-10-12 DIAGNOSIS — N814 Uterovaginal prolapse, unspecified: Secondary | ICD-10-CM | POA: Diagnosis not present

## 2018-10-12 NOTE — Patient Instructions (Signed)
Follow-up in 1 month for pessary recheck.  Sooner if any issues.

## 2018-10-12 NOTE — Progress Notes (Signed)
    Tammy Levine 03-Feb-1951 [id number]        68 y.o.  G1P0010 presents for placement of her pessary.  Had developed uterine prolapse and was fitted with a #2 ring pessary.  Past medical history,surgical history, problem list, medications, allergies, family history and social history were all reviewed and documented in the EPIC chart.  Directed ROS with pertinent positives and negatives documented in the history of present illness/assessment and plan.  Exam: [name] assistant Vitals:   10/12/18 1427  BP: 120/78   General appearance:  Normal Abdomen soft nontender without masses guarding rebound Pelvic external BUS vagina with atrophic changes.  Cervix with atrophic changes.  Uterus grossly normal size midline mobile nontender.  Adnexa without masses or tenderness.  #2 ring pessary was placed without difficulty.  Assessment/Plan:  68 y.o. G1P0010 with uterine prolapse.  Ring pessary placed.  Patient will follow-up in 1 month for recheck.  We discussed about her trying to remove it and replace it herself but I asked her to wait until her first month checkup to see how she is doing.  She will call or follow-up sooner if any issues with the pessary.    Anastasio Auerbach MD, 3:15 PM 10/12/2018

## 2018-11-05 ENCOUNTER — Other Ambulatory Visit: Payer: Self-pay | Admitting: Hematology

## 2018-11-07 NOTE — Progress Notes (Signed)
[place]   [phone] [phone]   Clinic Follow up Note   Patient Care Team: Meccariello, Bernita Raisin, DO as PCP - General (Family Medicine) Jovita Kussmaul, MD as Consulting Physician (General Surgery) Truitt Merle, MD as Consulting Physician (Hematology) Gery Pray, MD as Consulting Physician (Radiation Oncology) Alla Feeling, NP as Nurse Practitioner (Nurse Practitioner)  Date of Service:  11/08/2018  CHIEF COMPLAINT: F/u of left breast cancer   SUMMARY OF ONCOLOGIC HISTORY: Oncology History Overview Note  Cancer Staging Malignant neoplasm of upper-inner quadrant of left breast in female, estrogen receptor positive ([place]) Staging form: Breast, AJCC 8th Edition - Clinical stage from 10/12/2017: Stage IB (cT2, cN0, cM0, G2, ER+, PR+, HER2-) - Signed by Truitt Merle, MD on 10/18/2017 - Pathologic stage from 11/01/2017: Stage IA (pT2, pN0, cM0, G3, ER+, PR+, HER2-, Oncotype DX score: 2) - Signed by Truitt Merle, MD on 01/03/2018     Malignant neoplasm of upper-inner quadrant of left breast in female, estrogen receptor positive ([place])  10/09/2017 Mammogram   Diagnostic Mammogram 10/09/17 IMPRESSION: 3.2 x 3.0 x 2.0 cm mass with imaging features highly suspicious for malignancy in the 11 o'clock position of the left breast.   10/11/2017 Initial Biopsy   Diagnosis 10/11/17 Breast, left, needle core biopsy, upper inner 11 o'clock - INVASIVE DUCTAL CARCINOMA WITH PAPILLARY FEATURES, SEE COMMENT.    10/11/2017 Receptors her2   Estrogen Receptor: 100%, POSITIVE, STRONG STAINING INTENSITY Progesterone Receptor: 80%, POSITIVE, STRONG STAINING INTENSITY Proliferation Marker Ki67: 15% HER2 - NEGATIVE   10/12/2017 Cancer Staging   Staging form: Breast, AJCC 8th Edition - Clinical stage from 10/12/2017: Stage IB (cT2, cN0, cM0, G2, ER+, PR+, HER2-) - Signed by Truitt Merle, MD on 10/18/2017   10/17/2017 Initial Diagnosis   Malignant neoplasm of upper-inner quadrant  of left breast in female, estrogen receptor positive ([place])   11/01/2017 Surgery    She had left breast lumpectomy with Dr. Marlou Starks on 11/01/2017   11/01/2017 Pathology Results   11/01/2017 Surgical Pathology Diagnosis 1. Lymph node, sentinel, biopsy, Left - NO CARCINOMA IDENTIFIED IN ONE LYMPH NODE (0/1) 2. Lymph node, sentinel, biopsy, Left - NO CARCINOMA IDENTIFIED IN ONE LYMPH NODE (0/1) 3. Lymph node, sentinel, biopsy, Left - NO CARCINOMA IDENTIFIED IN ONE LYMPH NODE (0/1) 4. Breast, lumpectomy, Left - INVASIVE MAMMARY CARCINOMA, WITH PAPILLARY AND MICROPAPILLARY FEATURES, NOTTINGHAM GRADE 3 OF 3, 3.1 CM - MARGINS UNINVOLVED BY CARCINOMA (0.2 CM; POSTERIOR MARGIN) - SEE ONCOLOGY TABLE AND COMMENT BELOW   11/01/2017 Oncotype testing   RS Score 2 Distant recurrence risk at 9 years 3% Group average absolute chemo benefit <1%   11/01/2017 Cancer Staging   Staging form: Breast, AJCC 8th Edition - Pathologic stage from 11/01/2017: Stage IA (pT2, pN0, cM0, G3, ER+, PR+, HER2-, Oncotype DX score: 2) - Signed by Truitt Merle, MD on 01/03/2018   12/12/2017 - 01/05/2018 Radiation Therapy   Daily radiation therapy with Dr. Sondra Come Radiation treatment dates:   12/11/17 - 01/05/18  Site/dose:  Total of 50.05 Gy in 20 fractions 1. Breast, Left/ 40.05 Gy delivered in 15 fractions of 2.67 Gy 2. Boost/ 10 Gy delivered in 5 fractions of 2 Gy  Beams/energy:    1. 3D, Photon/ 6X 2. Isodose Plan/ 6X  Narrative: The patient tolerated radiation treatment relatively well. She reported cording to the axilla that resolved over several days. By the end of treatments, she reported mild fatigue and slight sensitivity to the breast. Nurse note reported  nipple was red and sensitive, and skin slightly hyperpigmented with rash appearance.     03/2018 -  Anti-estrogen oral therapy   Tamoxifen 71m daily starting 03/2018. D/c on 08/08/18 due to vaginal discharge. Start Anastrozole in 08/2018.    07/18/2018 Survivorship    SCP per LCira Rue NP       CURRENT THERAPY:  Tamoxifen 294mdaily starting 03/2018. D/c on 08/08/18 due to vaginal discharge. Start Anastrozole in 08/2018.   INTERVAL HISTORY:  Tammy Levine here for a follow up left breast cancer.  She started anastrozole a few months ago, has been tolerating overall well so far, better than tamoxifen.  She reports mild to moderate fatigue, but able to function well at home.  She has mild hot flash, manageable.  No significant vaginal discharge or other new complaints.  REVIEW OF SYSTEMS:   Constitutional: Denies fevers, chills or abnormal weight loss, (+) fatigue  Eyes: Denies blurriness of vision Ears, nose, mouth, throat, and face: Denies mucositis or sore throat Respiratory: Denies cough, dyspnea or wheezes Cardiovascular: Denies palpitation, chest discomfort or lower extremity swelling Gastrointestinal:  Denies nausea, heartburn or change in bowel habits Skin: Denies abnormal skin rashes Lymphatics: Denies new lymphadenopathy or easy bruising Neurological:Denies numbness, tingling or new weaknesses Behavioral/Psych: Mood is stable, no new changes  All other systems were reviewed with the patient and are negative.  MEDICAL HISTORY:  Past Medical History:  Diagnosis Date  . Breast cancer, [place]  . Hypertension   . Personal history of radiation therapy     SURGICAL HISTORY: Past Surgical History:  Procedure Laterality Date  . BLEPHAROPLASTY     lower eyelid  . BREAST LUMPECTOMY    . BREAST LUMPECTOMY WITH AXILLARY LYMPH NODE BIOPSY Left 11/01/2017   Procedure: BREAST LUMPECTOMY WITH SENTINEL LYMPH NODE MAPPING;  Surgeon: ToJovita KussmaulMD;  Location: [facility] Service: General;  Laterality: Left;    I have reviewed the social history and family history with the patient and they are unchanged from previous note.  ALLERGIES:  has No Known Allergies.  MEDICATIONS:  Current Outpatient Medications   Medication Sig Dispense Refill  . anastrozole (ARIMIDEX) 1 MG tablet TAKE 1 TABLET BY MOUTH EVERY DAY 90 tablet 1  . Calcium Carbonate-Vit D-Min (CALCIUM 1200) 1200-1000 MG-UNIT CHEW Chew by mouth.    . Marland Kitchenbuprofen (ADVIL,MOTRIN) 200 MG tablet Take 200 mg by mouth every 6 (six) hours as needed.    . Marland Kitchenosartan (COZAAR) 25 MG tablet Take 1 tablet (25 mg total) by mouth daily. 90 tablet 3  . naproxen sodium (ALEVE) 220 MG tablet Take 220 mg by mouth daily as needed.    . vitamin C (ASCORBIC ACID) 500 MG tablet Take 500 mg by mouth daily.     No current facility-administered medications for this visit.     PHYSICAL EXAMINATION: ECOG PERFORMANCE STATUS: 1 - Symptomatic but completely ambulatory  Vitals:   11/08/18 0931  BP: (!) 160/87  Pulse: 63  Resp: 17  Temp: 98.5 F (36.9 C)  SpO2: 99%   Filed Weights   11/08/18 0931  Weight: 111 lb 6.4 oz (50.5 kg)   GENERAL:alert, no distress and comfortable SKIN: skin color, texture, turgor are normal, no rashes or significant lesions EYES: normal, Conjunctiva are pink and non-injected, sclera clear NECK: supple, thyroid normal size, non-tender, without nodularity LYMPH:  no palpable lymphadenopathy in the cervical, axillary  LUNGS: clear to auscultation and percussion with normal breathing effort  HEART: regular rate & rhythm and no murmurs and no lower extremity edema ABDOMEN:abdomen soft, non-tender and normal bowel sounds Musculoskeletal:no cyanosis of digits and no clubbing  NEURO: alert & oriented x 3 with fluent speech, no focal motor/sensory deficits BREAST: S/p left lumpecotmy: Surgical incisoin healed well. No palpable mass, nodules or adenopathy bilaterally. Right breast and b/l axilla exam benign.   LABORATORY DATA:  I have reviewed the data as listed CBC Latest Ref Rng & Units 11/08/2018 08/08/2018 10/18/2017  WBC 4.0 - 10.5 K/uL 5.1 6.8 7.5  Hemoglobin 12.0 - 15.0 g/dL 12.4 12.3 13.7  Hematocrit 36.0 - 46.0 % 39.5 40.4 42.8   Platelets 150 - 400 K/uL 252 222 299     CMP Latest Ref Rng & Units 11/08/2018 08/08/2018 12/26/2017  Glucose 70 - 99 mg/dL 95 115(H) 99  BUN 8 - 23 mg/dL _0 Creatinine 0.44 - 1.00 mg/dL 0.75 0.89 0.75  Sodium 135 - 145 mmol/L 139 142 142  Potassium 3.5 - 5.1 mmol/L 4.0 3.7 3.9  Chloride 98 - 111 mmol/L 108 106 104  CO2 22 - 32 mmol/L _1 Calcium 8.9 - 10.3 mg/dL 8.9 8.8(L) 9.2  Total Protein 6.5 - 8.1 g/dL 7.0 7.0 -  Total Bilirubin 0.3 - 1.2 mg/dL 0.3 0.3 -  Alkaline Phos 38 - 126 U/L 58 53 -  AST 15 - 41 U/L 22 20 -  ALT 0 - 44 U/L 17 16 -      RADIOGRAPHIC STUDIES: I have personally reviewed the radiological images as listed and agreed with the findings in the report. No results found.   ASSESSMENT & PLAN:  Lynsee Wands is a 68 y.o. female with   1. Malignant neoplasm of upper-inner quadrant of left breast, Stage IA,pT2N0M0, ER/PR: (+), HER2 (-), Grade III, RS 2 -She was diagnosed in 09/2017. She is s/p left breast lumpectomy and adjuvant radiation.  -Her Oncotype report which showed low riskdisease withrecurrence score of 2 and with anti-estrogen therapy alone, the predicted distant recurrence risk is 3% in the next 9 years. There is less than 1% benefit of adjuvant chemotherapy, so I do not recommend chemotherapy.  -She started anti-estrogen therapy with Tamoxifen in 03/2018, tolerated well innitially but then developed vaginal discharge and slight of uterine or bladder prolapse. We stopped Tmaoxifen and in 08/2018 started her on Anastrozole, she is tolerating well so far. I encouraged her to conitnue antiestrogen tehrapy given her T2 lesion, Grade 3 disease, although Oncotype showed low risk disease.  -Lab reviewed, CBC and CMP are unremarkable except mild hypercalcemia.  Physical exam today and her recent mammogram in July was unremarkable, no clinical concern for recurrence -Continue anastrozole -Phone visit in 3 months to follow-up her tolerance to  anastrozole -Lab and follow-up in 6 months    2. Osteoporosis  -Her 03/2018 DEXA shows osteoporosis with lowest T-score of -3.6 at [facility]. She is at high risk for fracture.  -Due to poor toleration her Tamoxifen was swithced to Anastrozole which can weaken her bone.  -I recommended Zometa infusion every 6 months for 2 years. I reviewed side effects of bone pain, infusion reaction, and small risk of jaw necrosis etc. I encouraged her to maintain good dental hygiene and follow ups. She is agreeable. Plan to start today (10/2018). -She will continue calcium and Vitamin D   3.Hypertension and hyperglycemia -she is on losartan  -BP again is high today, I encourage her to f/u with PCP  PLAN: -Proceed with Zometa today -Continue anastrozole  -Lab, f/u and Zometa infusion in 6 months    No problem-specific Assessment & Plan notes found for this encounter.   No orders of the defined types were placed in this encounter.  All questions were answered. The patient knows to call the clinic with any problems, questions or concerns. No barriers to learning was detected. I spent 15 minutes counseling the patient face to face. The total time spent in the appointment was 20 minutes and more than 50% was on counseling and review of test results     Truitt Merle, MD 11/09/2018   I, Joslyn Devon, am acting as scribe for Truitt Merle, MD.   I have reviewed the above documentation for accuracy and completeness, and I agree with the above.

## 2018-11-08 ENCOUNTER — Inpatient Hospital Stay: Payer: Medicare Other

## 2018-11-08 ENCOUNTER — Telehealth: Payer: Self-pay | Admitting: Hematology

## 2018-11-08 ENCOUNTER — Inpatient Hospital Stay (HOSPITAL_BASED_OUTPATIENT_CLINIC_OR_DEPARTMENT_OTHER): Payer: Medicare Other | Admitting: Hematology

## 2018-11-08 ENCOUNTER — Other Ambulatory Visit: Payer: Self-pay

## 2018-11-08 ENCOUNTER — Inpatient Hospital Stay: Payer: Medicare Other | Attending: Nurse Practitioner

## 2018-11-08 VITALS — BP 160/87 | HR 63 | Temp 98.5°F | Resp 17 | Ht 62.5 in | Wt 111.4 lb

## 2018-11-08 DIAGNOSIS — Z79899 Other long term (current) drug therapy: Secondary | ICD-10-CM | POA: Diagnosis not present

## 2018-11-08 DIAGNOSIS — Z79811 Long term (current) use of aromatase inhibitors: Secondary | ICD-10-CM | POA: Insufficient documentation

## 2018-11-08 DIAGNOSIS — Z170 Estrogen receptor positive status [ER+]: Secondary | ICD-10-CM | POA: Diagnosis not present

## 2018-11-08 DIAGNOSIS — M810 Age-related osteoporosis without current pathological fracture: Secondary | ICD-10-CM | POA: Insufficient documentation

## 2018-11-08 DIAGNOSIS — C50212 Malignant neoplasm of upper-inner quadrant of left female breast: Secondary | ICD-10-CM | POA: Insufficient documentation

## 2018-11-08 DIAGNOSIS — I10 Essential (primary) hypertension: Secondary | ICD-10-CM | POA: Diagnosis not present

## 2018-11-08 DIAGNOSIS — Z923 Personal history of irradiation: Secondary | ICD-10-CM | POA: Diagnosis not present

## 2018-11-08 DIAGNOSIS — R5383 Other fatigue: Secondary | ICD-10-CM | POA: Insufficient documentation

## 2018-11-08 DIAGNOSIS — R739 Hyperglycemia, unspecified: Secondary | ICD-10-CM | POA: Diagnosis not present

## 2018-11-08 LAB — CBC WITH DIFFERENTIAL (CANCER CENTER ONLY)
Abs Immature Granulocytes: 0.01 10*3/uL (ref 0.00–0.07)
Basophils Absolute: 0.1 10*3/uL (ref 0.0–0.1)
Basophils Relative: 1 %
Eosinophils Absolute: 0.6 10*3/uL — ABNORMAL HIGH (ref 0.0–0.5)
Eosinophils Relative: 11 %
HCT: 39.5 % (ref 36.0–46.0)
Hemoglobin: 12.4 g/dL (ref 12.0–15.0)
Immature Granulocytes: 0 %
Lymphocytes Relative: 31 %
Lymphs Abs: 1.6 10*3/uL (ref 0.7–4.0)
MCH: 27 pg (ref 26.0–34.0)
MCHC: 31.4 g/dL (ref 30.0–36.0)
MCV: 85.9 fL (ref 80.0–100.0)
Monocytes Absolute: 0.5 10*3/uL (ref 0.1–1.0)
Monocytes Relative: 10 %
Neutro Abs: 2.4 10*3/uL (ref 1.7–7.7)
Neutrophils Relative %: 47 %
Platelet Count: 252 10*3/uL (ref 150–400)
RBC: 4.6 MIL/uL (ref 3.87–5.11)
RDW: 13.3 % (ref 11.5–15.5)
WBC Count: 5.1 10*3/uL (ref 4.0–10.5)
nRBC: 0 % (ref 0.0–0.2)

## 2018-11-08 LAB — CMP (CANCER CENTER ONLY)
ALT: 17 U/L (ref 0–44)
AST: 22 U/L (ref 15–41)
Albumin: 3.9 g/dL (ref 3.5–5.0)
Alkaline Phosphatase: 58 U/L (ref 38–126)
Anion gap: 9 (ref 5–15)
BUN: 21 mg/dL (ref 8–23)
CO2: 22 mmol/L (ref 22–32)
Calcium: 8.9 mg/dL (ref 8.9–10.3)
Chloride: 108 mmol/L (ref 98–111)
Creatinine: 0.75 mg/dL (ref 0.44–1.00)
GFR, Est AFR Am: >60 mL/min (ref >60)
GFR, Estimated: >60 mL/min (ref >60)
Glucose, Bld: 95 mg/dL (ref 70–99)
Potassium: 4 mmol/L (ref 3.5–5.1)
Sodium: 139 mmol/L (ref 135–145)
Total Bilirubin: 0.3 mg/dL (ref 0.3–1.2)
Total Protein: 7 g/dL (ref 6.5–8.1)

## 2018-11-08 MED ORDER — SODIUM CHLORIDE 0.9 % IV SOLN
Freq: Once | INTRAVENOUS | Status: AC
  Administered 2018-11-08: 10:00:00 via INTRAVENOUS
  Filled 2018-11-08: qty 250

## 2018-11-08 MED ORDER — ZOLEDRONIC ACID 4 MG/100ML IV SOLN
4.0000 mg | Freq: Once | INTRAVENOUS | Status: AC
  Administered 2018-11-08: 10:00:00 4 mg via INTRAVENOUS
  Filled 2018-11-08: qty 100

## 2018-11-08 NOTE — Telephone Encounter (Signed)
Scheduled appt per 8/20 los.  Spoke with patient and she is aware of the appt date and time.

## 2018-11-08 NOTE — Patient Instructions (Signed)
Zoledronic Acid injection (Hypercalcemia, Oncology) What is this medicine? ZOLEDRONIC ACID (ZOE le dron ik AS id) lowers the amount of calcium loss from bone. It is used to treat too much calcium in your blood from cancer. It is also used to prevent complications of cancer that has spread to the bone. This medicine may be used for other purposes; ask your health care provider or pharmacist if you have questions. COMMON BRAND NAME(S): Zometa What should I tell my health care provider before I take this medicine? They need to know if you have any of these conditions:  aspirin-sensitive asthma  cancer, especially if you are receiving medicines used to treat cancer  dental disease or wear dentures  infection  kidney disease  receiving corticosteroids like dexamethasone or prednisone  an unusual or allergic reaction to zoledronic acid, other medicines, foods, dyes, or preservatives  pregnant or trying to get pregnant  breast-feeding How should I use this medicine? This medicine is for infusion into a vein. It is given by a health care professional in a hospital or clinic setting. Talk to your pediatrician regarding the use of this medicine in children. Special care may be needed. Overdosage: If you think you have taken too much of this medicine contact a poison control center or emergency room at once. NOTE: This medicine is only for you. Do not share this medicine with others. What if I miss a dose? It is important not to miss your dose. Call your doctor or health care professional if you are unable to keep an appointment. What may interact with this medicine?  certain antibiotics given by injection  NSAIDs, medicines for pain and inflammation, like ibuprofen or naproxen  some diuretics like bumetanide, furosemide  teriparatide  thalidomide This list may not describe all possible interactions. Give your health care provider a list of all the medicines, herbs, non-prescription  drugs, or dietary supplements you use. Also tell them if you smoke, drink alcohol, or use illegal drugs. Some items may interact with your medicine. What should I watch for while using this medicine? Visit your doctor or health care professional for regular checkups. It may be some time before you see the benefit from this medicine. Do not stop taking your medicine unless your doctor tells you to. Your doctor may order blood tests or other tests to see how you are doing. Women should inform their doctor if they wish to become pregnant or think they might be pregnant. There is a potential for serious side effects to an unborn child. Talk to your health care professional or pharmacist for more information. You should make sure that you get enough calcium and vitamin D while you are taking this medicine. Discuss the foods you eat and the vitamins you take with your health care professional. Some people who take this medicine have severe bone, joint, and/or muscle pain. This medicine may also increase your risk for jaw problems or a broken thigh bone. Tell your doctor right away if you have severe pain in your jaw, bones, joints, or muscles. Tell your doctor if you have any pain that does not go away or that gets worse. Tell your dentist and dental surgeon that you are taking this medicine. You should not have major dental surgery while on this medicine. See your dentist to have a dental exam and fix any dental problems before starting this medicine. Take good care of your teeth while on this medicine. Make sure you see your dentist for regular follow-up   appointments. What side effects may I notice from receiving this medicine? Side effects that you should report to your doctor or health care professional as soon as possible:  allergic reactions like skin rash, itching or hives, swelling of the face, lips, or tongue  anxiety, confusion, or depression  breathing problems  changes in vision  eye  pain  feeling faint or lightheaded, falls  jaw pain, especially after dental work  mouth sores  muscle cramps, stiffness, or weakness  redness, blistering, peeling or loosening of the skin, including inside the mouth  trouble passing urine or change in the amount of urine Side effects that usually do not require medical attention (report to your doctor or health care professional if they continue or are bothersome):  bone, joint, or muscle pain  constipation  diarrhea  fever  hair loss  irritation at site where injected  loss of appetite  nausea, vomiting  stomach upset  trouble sleeping  trouble swallowing  weak or tired This list may not describe all possible side effects. Call your doctor for medical advice about side effects. You may report side effects to FDA at 1-800-FDA-1088. Where should I keep my medicine? This drug is given in a hospital or clinic and will not be stored at home. NOTE: This sheet is a summary. It may not cover all possible information. If you have questions about this medicine, talk to your doctor, pharmacist, or health care provider.  2020 Elsevier/Gold Standard (2013-08-03 14:19:39)  

## 2018-11-09 ENCOUNTER — Encounter: Payer: Self-pay | Admitting: Hematology

## 2018-11-13 ENCOUNTER — Other Ambulatory Visit: Payer: Self-pay

## 2018-11-14 ENCOUNTER — Encounter: Payer: Self-pay | Admitting: Gynecology

## 2018-11-14 ENCOUNTER — Ambulatory Visit (INDEPENDENT_AMBULATORY_CARE_PROVIDER_SITE_OTHER): Payer: Medicare Other | Admitting: Gynecology

## 2018-11-14 VITALS — BP 130/80

## 2018-11-14 DIAGNOSIS — Z4689 Encounter for fitting and adjustment of other specified devices: Secondary | ICD-10-CM | POA: Diagnosis not present

## 2018-11-14 DIAGNOSIS — N814 Uterovaginal prolapse, unspecified: Secondary | ICD-10-CM | POA: Diagnosis not present

## 2018-11-14 NOTE — Patient Instructions (Signed)
Follow up in 3 months for pessary check

## 2018-11-14 NOTE — Progress Notes (Signed)
    Tammy Levine Jul 29, 1950 [id number]        68 y.o.  G1P0010 presents for pessary check.  Was fitted with a ring pessary last month.  Reports doing great with relief of her symptoms.  No bleeding or pain.  Past medical history,surgical history, problem list, medications, allergies, family history and social history were all reviewed and documented in the EPIC chart.  Directed ROS with pertinent positives and negatives documented in the history of present illness/assessment and plan.  Exam: [name] assistant Vitals:   11/14/18 1453  BP: 130/80   General appearance:  Normal Abdomen soft nontender without masses guarding rebound Pelvic external BUS vagina with atrophic changes.  Ring pessary removed.  Vaginal mucosa without evidence of irritation or erosion.  Cervix with atrophic changes.  Uterus grossly normal midline mobile nontender.  Adnexa without masses or tenderness.  Ring pessary was cleansed and replaced without difficulty.  Assessment/Plan:  68 y.o. G1P0010 with good response to ring pessary.  Still thinking about having surgery but wants to postpone this until the COVID situation is resolved.  Will follow-up in 3 months for pessary recheck.  Follow-up sooner if any bleeding or pain.    Anastasio Auerbach MD, 3:19 PM 11/14/2018

## 2018-12-06 DIAGNOSIS — Z23 Encounter for immunization: Secondary | ICD-10-CM | POA: Diagnosis not present

## 2018-12-10 DIAGNOSIS — Z23 Encounter for immunization: Secondary | ICD-10-CM | POA: Diagnosis not present

## 2018-12-26 ENCOUNTER — Encounter: Payer: Self-pay | Admitting: Gynecology

## 2019-01-14 ENCOUNTER — Ambulatory Visit (INDEPENDENT_AMBULATORY_CARE_PROVIDER_SITE_OTHER): Payer: Medicare Other | Admitting: Gynecology

## 2019-01-14 ENCOUNTER — Other Ambulatory Visit: Payer: Self-pay

## 2019-01-14 ENCOUNTER — Encounter: Payer: Self-pay | Admitting: Gynecology

## 2019-01-14 VITALS — BP 118/74

## 2019-01-14 DIAGNOSIS — Z4689 Encounter for fitting and adjustment of other specified devices: Secondary | ICD-10-CM | POA: Diagnosis not present

## 2019-01-14 DIAGNOSIS — N814 Uterovaginal prolapse, unspecified: Secondary | ICD-10-CM

## 2019-01-14 NOTE — Progress Notes (Signed)
    Tammy Levine 1950/12/19 [id number]        68 y.o.  G1P0010 for pessary check.  History of uterine prolapse with ring pessary placed.  Doing well without discomfort or bleeding.  She does have a little bit intermittent irritation when urine hits the right side of her vaginal opening.  Has used several different creams to help coat this area.  Past medical history,surgical history, problem list, medications, allergies, family history and social history were all reviewed and documented in the EPIC chart.  Directed ROS with pertinent positives and negatives documented in the history of present illness/assessment and plan.  Exam: [name] assistant Vitals:   01/14/19 1417  BP: 118/74   General appearance:  Normal Abdomen soft nontender without mass guarding rebound Pelvic external BUS vagina with atrophic changes.  Her inner aspects of her labia minora bilaterally are fragile but no specific lesions noted.  Ring pessary was removed.  Vagina without significant irritation or erosion.  Bimanual without masses or tenderness.  Ring pessary was cleansed and replaced without difficulty.  Assessment/Plan:  68 y.o. G1P0010 with uterine prolapse using ring pessary with good results.  Discussed the fragile vaginal mucosa.  She does have a history of breast cancer and at this point we do not feel vaginal estrogen appropriate.  She will continue to use an aloe-based cream to help coat this area of the prevent urine from stinging.  She still plans to have surgery done after the Covid pandemic clears.  Will follow up in 3 to 6 months for pessary recheck.    Anastasio Auerbach MD, 2:51 PM 01/14/2019

## 2019-01-14 NOTE — Patient Instructions (Signed)
Follow-up in 3 to 6 months for pessary recheck.

## 2019-01-25 ENCOUNTER — Other Ambulatory Visit: Payer: Self-pay

## 2019-01-25 ENCOUNTER — Encounter: Payer: Self-pay | Admitting: Family Medicine

## 2019-01-25 ENCOUNTER — Ambulatory Visit (INDEPENDENT_AMBULATORY_CARE_PROVIDER_SITE_OTHER): Payer: Medicare Other | Admitting: Family Medicine

## 2019-01-25 VITALS — BP 140/88 | HR 68 | Wt 112.4 lb

## 2019-01-25 DIAGNOSIS — Z23 Encounter for immunization: Secondary | ICD-10-CM | POA: Diagnosis not present

## 2019-01-25 DIAGNOSIS — Z1211 Encounter for screening for malignant neoplasm of colon: Secondary | ICD-10-CM | POA: Diagnosis not present

## 2019-01-25 DIAGNOSIS — I10 Essential (primary) hypertension: Secondary | ICD-10-CM | POA: Diagnosis not present

## 2019-01-25 MED ORDER — LOSARTAN POTASSIUM 25 MG PO TABS: 25.0000 mg | ORAL_TABLET | Freq: Every day | ORAL | 3 refills | Status: DC

## 2019-01-25 NOTE — Patient Instructions (Signed)
Thank you for coming to see me today. It was a pleasure. Today we talked about:   Your blood pressure:  I have refilled your medication for 1 year.  Please do the stool test and bring it back.  Please follow-up with me in 1 year or sooner as needed.  If you have any questions or concerns, please do not hesitate to call the office at [phone].  Best,   [place] Constable, DO

## 2019-01-25 NOTE — Progress Notes (Signed)
     Subjective: Chief Complaint  Patient presents with  . Medication Refill     HPI: Tammy Levine is a 68 y.o. presenting to clinic today to discuss the following:  1 HTN - Medications: Losartan  - Compliance: good - Checking BP at home: no, says at gynecologist it has always been fine - Denies any SOB, CP, vision changes, LE edema, medication SEs, or symptoms of hypotension - Diet: good diet - Exercise: good, exercises daily and goes swimming, does weight lifting    Health Maintenance: needs colonoscopy and 2nd pneumonia    ROS noted in HPI. Chief complaint noted.  Other Pertinent PMH: History of breast cancer, on anastrozole every 6 months, osteoporosis Past Medical, Surgical, Social, and Family History Reviewed & Updated per EMR.      Social History   Tobacco Use  Smoking Status Former Smoker  Smokeless Tobacco Never Used   Smoking status noted.    Objective: BP 140/88   Pulse 68   Wt 112 lb 6.4 oz (51 kg)   LMP 03/21/2002   SpO2 100%   BMI 20.23 kg/m  Vitals and nursing notes reviewed  Physical Exam:  General: 68 y.o. female in NAD, thin build Cardio: RRR no m/r/g Lungs: CTAB, no wheezing, no rhonchi, no crackles, no IWOB on room air Skin: warm and dry Extremities: No edema   No results found for this or any previous visit (from the past 72 hour(s)).  Assessment/Plan:  Essential hypertension Well-controlled.  Recent BMP in August 2020 within normal limits.  Refilled losartan 86-month supply, for 1 year.  She reports that her blood pressure is normal when checked other places and that she is always nervous in this office, therefore elevation today is likely secondary to whitecoat hypertension.  Patient to follow-up in 1 year.     PATIENT EDUCATION PROVIDED: See AVS    Diagnosis and plan along with any newly prescribed medication(s) were discussed in detail with this patient today. The patient verbalized understanding and agreed with the plan.  Patient advised if symptoms worsen return to clinic or ER.   Health Maintainance: 2nd pneumonia today, patient declines colonoscopy.  Will send home with FOBT   Orders Placed This Encounter  Procedures  . Fecal occult blood, imunochemical([facility])  . Pneumococcal polysaccharide vaccine 23-valent greater than or equal to 2yo subcutaneous/IM    Meds ordered this encounter  Medications  . losartan (COZAAR) 25 MG tablet    Sig: Take 1 tablet (25 mg total) by mouth daily.    Dispense:  90 tablet    Refill:  Rural Hall, DO 01/25/2019, 5:00 PM PGY-2 [place]

## 2019-01-25 NOTE — Assessment & Plan Note (Signed)
Well-controlled.  Recent BMP in August 2020 within normal limits.  Refilled losartan 38-month supply, for 1 year.  She reports that her blood pressure is normal when checked other places and that she is always nervous in this office, therefore elevation today is likely secondary to whitecoat hypertension.  Patient to follow-up in 1 year.

## 2019-01-28 DIAGNOSIS — Z1211 Encounter for screening for malignant neoplasm of colon: Secondary | ICD-10-CM | POA: Diagnosis not present

## 2019-01-30 LAB — FECAL OCCULT BLOOD, IMMUNOCHEMICAL: Fecal Occult Bld: NEGATIVE

## 2019-02-06 ENCOUNTER — Telehealth: Payer: Self-pay | Admitting: Hematology

## 2019-02-06 NOTE — Telephone Encounter (Signed)
Returned patient's phone call regarding cancelling 11/20 appointment, per patient's request appointment has been cancelled.

## 2019-02-08 ENCOUNTER — Inpatient Hospital Stay: Payer: Medicare Other | Admitting: Hematology

## 2019-05-08 NOTE — Progress Notes (Signed)
Tammy Levine   [phone] [phone]   Clinic Follow up Note   Patient Care Team: Meccariello, Bernita Raisin, DO as PCP - General (Family Medicine) Jovita Kussmaul, MD as Consulting Physician (General Surgery) Truitt Merle, MD as Consulting Physician (Hematology) Gery Pray, MD as Consulting Physician (Radiation Oncology) Alla Feeling, NP as Nurse Practitioner (Nurse Practitioner)  Date of Service:  05/10/2019  CHIEF COMPLAINT: F/u of left breast cancer  SUMMARY OF ONCOLOGIC HISTORY: Oncology History Overview Note  Cancer Staging Malignant neoplasm of upper-inner quadrant of left breast in female, estrogen receptor positive ([place]) Staging form: Breast, AJCC 8th Edition - Clinical stage from 10/12/2017: Stage IB (cT2, cN0, cM0, G2, ER+, PR+, HER2-) - Signed by Truitt Merle, MD on 10/18/2017 - Pathologic stage from 11/01/2017: Stage IA (pT2, pN0, cM0, G3, ER+, PR+, HER2-, Oncotype DX score: 2) - Signed by Truitt Merle, MD on 01/03/2018     Malignant neoplasm of upper-inner quadrant of left breast in female, estrogen receptor positive ([place])  10/09/2017 Mammogram   Diagnostic Mammogram 10/09/17 IMPRESSION: 3.2 x 3.0 x 2.0 cm mass with imaging features highly suspicious for malignancy in the 11 o'clock position of the left breast.   10/11/2017 Initial Biopsy   Diagnosis 10/11/17 Breast, left, needle core biopsy, upper inner 11 o'clock - INVASIVE DUCTAL CARCINOMA WITH PAPILLARY FEATURES, SEE COMMENT.    10/11/2017 Receptors her2   Estrogen Receptor: 100%, POSITIVE, STRONG STAINING INTENSITY Progesterone Receptor: 80%, POSITIVE, STRONG STAINING INTENSITY Proliferation Marker Ki67: 15% HER2 - NEGATIVE   10/12/2017 Cancer Staging   Staging form: Breast, AJCC 8th Edition - Clinical stage from 10/12/2017: Stage IB (cT2, cN0, cM0, G2, ER+, PR+, HER2-) - Signed by Truitt Merle, MD on 10/18/2017   10/17/2017 Initial Diagnosis   Malignant neoplasm of upper-inner quadrant  of left breast in female, estrogen receptor positive ([place])   11/01/2017 Surgery    She had left breast lumpectomy with Dr. Marlou Starks on 11/01/2017   11/01/2017 Pathology Results   11/01/2017 Surgical Pathology Diagnosis 1. Lymph node, sentinel, biopsy, Left - NO CARCINOMA IDENTIFIED IN ONE LYMPH NODE (0/1) 2. Lymph node, sentinel, biopsy, Left - NO CARCINOMA IDENTIFIED IN ONE LYMPH NODE (0/1) 3. Lymph node, sentinel, biopsy, Left - NO CARCINOMA IDENTIFIED IN ONE LYMPH NODE (0/1) 4. Breast, lumpectomy, Left - INVASIVE MAMMARY CARCINOMA, WITH PAPILLARY AND MICROPAPILLARY FEATURES, NOTTINGHAM GRADE 3 OF 3, 3.1 CM - MARGINS UNINVOLVED BY CARCINOMA (0.2 CM; POSTERIOR MARGIN) - SEE ONCOLOGY TABLE AND COMMENT BELOW   11/01/2017 Oncotype testing   RS Score 2 Distant recurrence risk at 9 years 3% Group average absolute chemo benefit <1%   11/01/2017 Cancer Staging   Staging form: Breast, AJCC 8th Edition - Pathologic stage from 11/01/2017: Stage IA (pT2, pN0, cM0, G3, ER+, PR+, HER2-, Oncotype DX score: 2) - Signed by Truitt Merle, MD on 01/03/2018   12/12/2017 - 01/05/2018 Radiation Therapy   Daily radiation therapy with Dr. Sondra Come Radiation treatment dates:   12/11/17 - 01/05/18  Site/dose:  Total of 50.05 Gy in 20 fractions 1. Breast, Left/ 40.05 Gy delivered in 15 fractions of 2.67 Gy 2. Boost/ 10 Gy delivered in 5 fractions of 2 Gy  Beams/energy:    1. 3D, Photon/ 6X 2. Isodose Plan/ 6X  Narrative: The patient tolerated radiation treatment relatively well. She reported cording to the axilla that resolved over several days. By the end of treatments, she reported mild fatigue and slight sensitivity to the breast. Nurse note reported nipple  was red and sensitive, and skin slightly hyperpigmented with rash appearance.     03/2018 -  Anti-estrogen oral therapy   Tamoxifen '20mg'$  daily starting 03/2018. D/c on 08/08/18 due to vaginal discharge. Start Anastrozole in 08/2018.    07/18/2018 Survivorship    SCP per Cira Rue, NP       CURRENT THERAPY:  -Tamoxifen '20mg'$  daily starting 03/2018. D/c on 08/08/18 due to vaginal discharge. Start Anastrozole in 08/2018. -Zometa every 6 months for 2 years starting 11/08/18  INTERVAL HISTORY:  Tammy Levine is here for a follow up of left breast cancer. She was last seen by me 6 months ago. She presents to the clinic alone. She notes she is doing well. She notes her BP has been elevated in clinics and plans to discuss this with her PCP. She takes her HTN medication at night. She denies any chest pain or discomfort. She notes she does exercise daily. She notes she is tolerating Anastrozole well and has no issues. She notes she tolerated the Zometa infusion well. She continues calcium and Vit D.  She has been swimming 3-4 times a week and run/walk and lift weights. She overall eats well balanced diet. She notes she is on a wait list for her COVID19 vaccine.     REVIEW OF SYSTEMS:   Constitutional: Denies fevers, chills or abnormal weight loss Eyes: Denies blurriness of vision Ears, nose, mouth, throat, and face: Denies mucositis or sore throat Respiratory: Denies cough, dyspnea or wheezes Cardiovascular: Denies palpitation, chest discomfort or lower extremity swelling Gastrointestinal:  Denies nausea, heartburn or change in bowel habits Skin: Denies abnormal skin rashes Lymphatics: Denies new lymphadenopathy or easy bruising Neurological:Denies numbness, tingling or new weaknesses Behavioral/Psych: Mood is stable, no new changes  All other systems were reviewed with the patient and are negative.  MEDICAL HISTORY:  Past Medical History:  Diagnosis Date  . Breast cancer, left ([place]) 09/2017  . Hypertension   . Personal history of radiation therapy     SURGICAL HISTORY: Past Surgical History:  Procedure Laterality Date  . BLEPHAROPLASTY     lower eyelid  . BREAST LUMPECTOMY    . BREAST LUMPECTOMY WITH AXILLARY LYMPH NODE BIOPSY Left  11/01/2017   Procedure: BREAST LUMPECTOMY WITH SENTINEL LYMPH NODE MAPPING;  Surgeon: Jovita Kussmaul, MD;  Location: [facility];  Service: General;  Laterality: Left;    I have reviewed the social history and family history with the patient and they are unchanged from previous note.  ALLERGIES:  has No Known Allergies.  MEDICATIONS:  Current Outpatient Medications  Medication Sig Dispense Refill  . anastrozole (ARIMIDEX) 1 MG tablet TAKE 1 TABLET BY MOUTH EVERY DAY 90 tablet 1  . Calcium Carbonate-Vit D-Min (CALCIUM 1200) 1200-1000 MG-UNIT CHEW Chew by mouth.    Marland Kitchen ibuprofen (ADVIL,MOTRIN) 200 MG tablet Take 200 mg by mouth every 6 (six) hours as needed.    Marland Kitchen losartan (COZAAR) 25 MG tablet Take 1 tablet (25 mg total) by mouth daily. 90 tablet 3  . naproxen sodium (ALEVE) 220 MG tablet Take 220 mg by mouth daily as needed.    . vitamin C (ASCORBIC ACID) 500 MG tablet Take 500 mg by mouth daily.     No current facility-administered medications for this visit.   Facility-Administered Medications Ordered in Other Visits  Medication Dose Route Frequency Provider Last Rate Last Admin  . Zoledronic Acid (ZOMETA) IVPB 4 mg  4 mg Intravenous Once Truitt Merle, MD 400 mL/hr at  05/10/19 1115 4 mg at 05/10/19 1115    PHYSICAL EXAMINATION: ECOG PERFORMANCE STATUS: 0 - Asymptomatic  Vitals:   05/10/19 1024  BP: (!) 169/89  Pulse: 69  Resp: 17  Temp: 98.2 F (36.8 C)  SpO2: 98%   Filed Weights   05/10/19 1024  Weight: 112 lb 8 oz (51 kg)    GENERAL:alert, no distress and comfortable SKIN: skin color, texture, turgor are normal, no rashes or significant lesions EYES: normal, Conjunctiva are pink and non-injected, sclera clear  NECK: supple, thyroid normal size, non-tender, without nodularity LYMPH:  no palpable lymphadenopathy in the cervical, axillary  LUNGS: clear to auscultation and percussion with normal breathing effort HEART: regular rate & rhythm and no murmurs and no  lower extremity edema ABDOMEN:abdomen soft, non-tender and normal bowel sounds Musculoskeletal:no cyanosis of digits and no clubbing  NEURO: alert & oriented x 3 with fluent speech, no focal motor/sensory deficits BREAST: s/p left lumpectomy: Surgical incision healed well with very little scar tissue. No palpable mass, nodules or adenopathy bilaterally. Breast exam benign.   LABORATORY DATA:  I have reviewed the data as listed CBC Latest Ref Rng & Units 05/10/2019 11/08/2018 08/08/2018  WBC 4.0 - 10.5 K/uL 4.6 5.1 6.8  Hemoglobin 12.0 - 15.0 g/dL 13.4 12.4 12.3  Hematocrit 36.0 - 46.0 % 41.8 39.5 40.4  Platelets 150 - 400 K/uL 246 252 222     CMP Latest Ref Rng & Units 05/10/2019 11/08/2018 08/08/2018  Glucose 70 - 99 mg/dL 96 95 115(H)  BUN 8 - 23 mg/dL '21 21 21  '$ Creatinine 0.44 - 1.00 mg/dL 0.73 0.75 0.89  Sodium 135 - 145 mmol/L 140 139 142  Potassium 3.5 - 5.1 mmol/L 3.7 4.0 3.7  Chloride 98 - 111 mmol/L 107 108 106  CO2 22 - 32 mmol/L '23 22 28  '$ Calcium 8.9 - 10.3 mg/dL 9.1 8.9 8.8(L)  Total Protein 6.5 - 8.1 g/dL 7.6 7.0 7.0  Total Bilirubin 0.3 - 1.2 mg/dL 0.4 0.3 0.3  Alkaline Phos 38 - 126 U/L 43 58 53  AST 15 - 41 U/L '25 22 20  '$ ALT 0 - 44 U/L '26 17 16      '$ RADIOGRAPHIC STUDIES: I have personally reviewed the radiological images as listed and agreed with the findings in the report. No results found.   ASSESSMENT & PLAN:  Kalani Baray is a 69 y.o. female with   1. Malignant neoplasm of upper-inner quadrant of left breast, Stage IA,pT2N0M0, ER/PR: (+), HER2 (-), Grade III, RS 2 -She was diagnosed in 09/2017. She is s/p left breastlumpectomyand adjuvant radiation.  -HerOncotype report which showed low riskdisease withrecurrence score of 2 and with anti-estrogen therapy alone. There is less than 1% benefit of adjuvant chemotherapy, so I do not recommend chemotherapy. -She started anti-estrogen therapy with Tamoxifen in 03/2018, tolerated well initially but then  developed vaginal discharge and slight of uterine or bladder prolapse. We stopped Tamoxifen and in 08/2018 started her on Anastrozole, she is tolerating well so far without issue.  -She is clinically doing well. Lab reviewed, her CBC and CMP are within normal limits. Her physical exam and her 09/2018 mammogram were unremarkable. There is no clinical concern for recurrence. -Continue Surveillance. Next mammogram in 09/2019 -Continue anastrozole -F/u in 6 months. I encouraged her to continue her adequate exercise and balanced diet. She plans to proceed with COVID19 vaccine when it becomes available to her.    2.Osteoporosis -Her 03/2018 DEXA shows osteoporosis with lowest T-score of -  3.6 at [facility]. She is at high risk for fracture.  -Due to poor toleration her Tamoxifen was switched to Anastrozole which can weaken her bone.  -I started her on Zometa infusion every 6 months for 2 years on 11/08/18. Tolerating well. Will continue to manage dental health.  -She will continue calcium and Vitamin D. Next DEXA in 03/2020   3. Hypertension   -She is on losartan. She notes she is often nervous about cancer recurrence and her health.  -BP again is high today, I encouraged her to check her BP at home a few times a week and review this with her PCP soon. She may need Losartan dose adjustment. She is agreeable.    4. Uterine or bladder prolapse -She has been seen by Gyn Dr. Phineas Real and per his note she plans to have Hysterectomy once COVID19 improves.    PLAN: -Proceed with Zometa today -Continue anastrozole  -Mammogram in 09/2019  -Lab, f/u andZometainfusion in 6 months    No problem-specific Assessment & Plan notes found for this encounter.   Orders Placed This Encounter  Procedures  . MM DIAG BREAST TOMO BILATERAL    Standing Status:   Future    Standing Expiration Date:   05/09/2020    Order Specific Question:   Reason for Exam (SYMPTOM  OR DIAGNOSIS REQUIRED)    Answer:   screening     Order Specific Question:   Preferred imaging location?    Answer:   [facility]    Order Specific Question:   Release to patient    Answer:   Immediate   All questions were answered. The patient knows to call the clinic with any problems, questions or concerns. No barriers to learning was detected. The total time spent in the appointment was 25 minutes.     Truitt Merle, MD 05/10/2019   I, Joslyn Devon, am acting as scribe for Truitt Merle, MD.   I have reviewed the above documentation for accuracy and completeness, and I agree with the above.

## 2019-05-10 ENCOUNTER — Encounter: Payer: Self-pay | Admitting: Hematology

## 2019-05-10 ENCOUNTER — Other Ambulatory Visit: Payer: Self-pay

## 2019-05-10 ENCOUNTER — Inpatient Hospital Stay: Payer: Medicare Other

## 2019-05-10 ENCOUNTER — Inpatient Hospital Stay: Payer: Medicare Other | Attending: Hematology

## 2019-05-10 ENCOUNTER — Inpatient Hospital Stay (HOSPITAL_BASED_OUTPATIENT_CLINIC_OR_DEPARTMENT_OTHER): Payer: Medicare Other | Admitting: Hematology

## 2019-05-10 VITALS — BP 169/89 | HR 69 | Temp 98.2°F | Resp 17 | Ht 62.5 in | Wt 112.5 lb

## 2019-05-10 DIAGNOSIS — N8110 Cystocele, unspecified: Secondary | ICD-10-CM | POA: Insufficient documentation

## 2019-05-10 DIAGNOSIS — C50212 Malignant neoplasm of upper-inner quadrant of left female breast: Secondary | ICD-10-CM | POA: Insufficient documentation

## 2019-05-10 DIAGNOSIS — I10 Essential (primary) hypertension: Secondary | ICD-10-CM | POA: Insufficient documentation

## 2019-05-10 DIAGNOSIS — Z79811 Long term (current) use of aromatase inhibitors: Secondary | ICD-10-CM | POA: Diagnosis not present

## 2019-05-10 DIAGNOSIS — Z923 Personal history of irradiation: Secondary | ICD-10-CM | POA: Diagnosis not present

## 2019-05-10 DIAGNOSIS — Z170 Estrogen receptor positive status [ER+]: Secondary | ICD-10-CM | POA: Insufficient documentation

## 2019-05-10 DIAGNOSIS — N898 Other specified noninflammatory disorders of vagina: Secondary | ICD-10-CM | POA: Insufficient documentation

## 2019-05-10 DIAGNOSIS — M810 Age-related osteoporosis without current pathological fracture: Secondary | ICD-10-CM

## 2019-05-10 DIAGNOSIS — Z79899 Other long term (current) drug therapy: Secondary | ICD-10-CM | POA: Diagnosis not present

## 2019-05-10 LAB — CBC WITH DIFFERENTIAL (CANCER CENTER ONLY)
Abs Immature Granulocytes: 0.01 10*3/uL (ref 0.00–0.07)
Basophils Absolute: 0 10*3/uL (ref 0.0–0.1)
Basophils Relative: 1 %
Eosinophils Absolute: 0.2 10*3/uL (ref 0.0–0.5)
Eosinophils Relative: 4 %
HCT: 41.8 % (ref 36.0–46.0)
Hemoglobin: 13.4 g/dL (ref 12.0–15.0)
Immature Granulocytes: 0 %
Lymphocytes Relative: 32 %
Lymphs Abs: 1.5 10*3/uL (ref 0.7–4.0)
MCH: 27.7 pg (ref 26.0–34.0)
MCHC: 32.1 g/dL (ref 30.0–36.0)
MCV: 86.5 fL (ref 80.0–100.0)
Monocytes Absolute: 0.4 10*3/uL (ref 0.1–1.0)
Monocytes Relative: 9 %
Neutro Abs: 2.5 10*3/uL (ref 1.7–7.7)
Neutrophils Relative %: 54 %
Platelet Count: 246 10*3/uL (ref 150–400)
RBC: 4.83 MIL/uL (ref 3.87–5.11)
RDW: 13.3 % (ref 11.5–15.5)
WBC Count: 4.6 10*3/uL (ref 4.0–10.5)
nRBC: 0 % (ref 0.0–0.2)

## 2019-05-10 LAB — CMP (CANCER CENTER ONLY)
ALT: 26 U/L (ref 0–44)
AST: 25 U/L (ref 15–41)
Albumin: 4.3 g/dL (ref 3.5–5.0)
Alkaline Phosphatase: 43 U/L (ref 38–126)
Anion gap: 10 (ref 5–15)
BUN: 21 mg/dL (ref 8–23)
CO2: 23 mmol/L (ref 22–32)
Calcium: 9.1 mg/dL (ref 8.9–10.3)
Chloride: 107 mmol/L (ref 98–111)
Creatinine: 0.73 mg/dL (ref 0.44–1.00)
GFR, Est AFR Am: >60 mL/min (ref >60)
GFR, Estimated: >60 mL/min (ref >60)
Glucose, Bld: 96 mg/dL (ref 70–99)
Potassium: 3.7 mmol/L (ref 3.5–5.1)
Sodium: 140 mmol/L (ref 135–145)
Total Bilirubin: 0.4 mg/dL (ref 0.3–1.2)
Total Protein: 7.6 g/dL (ref 6.5–8.1)

## 2019-05-10 MED ORDER — ZOLEDRONIC ACID 4 MG/100ML IV SOLN
INTRAVENOUS | Status: AC
  Filled 2019-05-10: qty 100

## 2019-05-10 MED ORDER — ZOLEDRONIC ACID 4 MG/100ML IV SOLN
4.0000 mg | Freq: Once | INTRAVENOUS | Status: AC
  Administered 2019-05-10: 11:00:00 4 mg via INTRAVENOUS

## 2019-05-10 MED ORDER — SODIUM CHLORIDE 0.9 % IV SOLN
Freq: Once | INTRAVENOUS | Status: AC
  Filled 2019-05-10: qty 250

## 2019-05-10 NOTE — Patient Instructions (Signed)
Zoledronic Acid injection (Hypercalcemia, Oncology) What is this medicine? ZOLEDRONIC ACID (ZOE le dron ik AS id) lowers the amount of calcium loss from bone. It is used to treat too much calcium in your blood from cancer. It is also used to prevent complications of cancer that has spread to the bone. This medicine may be used for other purposes; ask your health care provider or pharmacist if you have questions. COMMON BRAND NAME(S): Zometa What should I tell my health care provider before I take this medicine? They need to know if you have any of these conditions:  aspirin-sensitive asthma  cancer, especially if you are receiving medicines used to treat cancer  dental disease or wear dentures  infection  kidney disease  receiving corticosteroids like dexamethasone or prednisone  an unusual or allergic reaction to zoledronic acid, other medicines, foods, dyes, or preservatives  pregnant or trying to get pregnant  breast-feeding How should I use this medicine? This medicine is for infusion into a vein. It is given by a health care professional in a hospital or clinic setting. Talk to your pediatrician regarding the use of this medicine in children. Special care may be needed. Overdosage: If you think you have taken too much of this medicine contact a poison control center or emergency room at once. NOTE: This medicine is only for you. Do not share this medicine with others. What if I miss a dose? It is important not to miss your dose. Call your doctor or health care professional if you are unable to keep an appointment. What may interact with this medicine?  certain antibiotics given by injection  NSAIDs, medicines for pain and inflammation, like ibuprofen or naproxen  some diuretics like bumetanide, furosemide  teriparatide  thalidomide This list may not describe all possible interactions. Give your health care provider a list of all the medicines, herbs, non-prescription  drugs, or dietary supplements you use. Also tell them if you smoke, drink alcohol, or use illegal drugs. Some items may interact with your medicine. What should I watch for while using this medicine? Visit your doctor or health care professional for regular checkups. It may be some time before you see the benefit from this medicine. Do not stop taking your medicine unless your doctor tells you to. Your doctor may order blood tests or other tests to see how you are doing. Women should inform their doctor if they wish to become pregnant or think they might be pregnant. There is a potential for serious side effects to an unborn child. Talk to your health care professional or pharmacist for more information. You should make sure that you get enough calcium and vitamin D while you are taking this medicine. Discuss the foods you eat and the vitamins you take with your health care professional. Some people who take this medicine have severe bone, joint, and/or muscle pain. This medicine may also increase your risk for jaw problems or a broken thigh bone. Tell your doctor right away if you have severe pain in your jaw, bones, joints, or muscles. Tell your doctor if you have any pain that does not go away or that gets worse. Tell your dentist and dental surgeon that you are taking this medicine. You should not have major dental surgery while on this medicine. See your dentist to have a dental exam and fix any dental problems before starting this medicine. Take good care of your teeth while on this medicine. Make sure you see your dentist for regular follow-up   appointments. What side effects may I notice from receiving this medicine? Side effects that you should report to your doctor or health care professional as soon as possible:  allergic reactions like skin rash, itching or hives, swelling of the face, lips, or tongue  anxiety, confusion, or depression  breathing problems  changes in vision  eye  pain  feeling faint or lightheaded, falls  jaw pain, especially after dental work  mouth sores  muscle cramps, stiffness, or weakness  redness, blistering, peeling or loosening of the skin, including inside the mouth  trouble passing urine or change in the amount of urine Side effects that usually do not require medical attention (report to your doctor or health care professional if they continue or are bothersome):  bone, joint, or muscle pain  constipation  diarrhea  fever  hair loss  irritation at site where injected  loss of appetite  nausea, vomiting  stomach upset  trouble sleeping  trouble swallowing  weak or tired This list may not describe all possible side effects. Call your doctor for medical advice about side effects. You may report side effects to FDA at 1-800-FDA-1088. Where should I keep my medicine? This drug is given in a hospital or clinic and will not be stored at home. NOTE: This sheet is a summary. It may not cover all possible information. If you have questions about this medicine, talk to your doctor, pharmacist, or health care provider.  2020 Elsevier/Gold Standard (2013-08-03 14:19:39)  

## 2019-05-13 ENCOUNTER — Encounter: Payer: Self-pay | Admitting: Obstetrics and Gynecology

## 2019-05-13 ENCOUNTER — Other Ambulatory Visit: Payer: Self-pay

## 2019-05-13 ENCOUNTER — Ambulatory Visit (INDEPENDENT_AMBULATORY_CARE_PROVIDER_SITE_OTHER): Payer: Medicare Other | Admitting: Obstetrics and Gynecology

## 2019-05-13 VITALS — BP 122/72

## 2019-05-13 DIAGNOSIS — Z4689 Encounter for fitting and adjustment of other specified devices: Secondary | ICD-10-CM | POA: Diagnosis not present

## 2019-05-13 DIAGNOSIS — N814 Uterovaginal prolapse, unspecified: Secondary | ICD-10-CM | POA: Diagnosis not present

## 2019-05-13 NOTE — Patient Instructions (Signed)
Return in 3 months for pessary maintenance

## 2019-05-13 NOTE — Progress Notes (Signed)
   Tammy Levine  [birth year] [id number]  HPI The patient is a 69 y.o. G1P0010 who presents today for a pessary check.  She has a history of uterine prolapse and has been using a #2 ring pessary with support.  Does not have any vaginal bleeding nor abnormal discharge. Decided against having surgery for prolapse in the time being.  Unable to remove the pessary by herself but has been able to replace it herself.  Past medical history,surgical history, problem list, medications, allergies, family history and social history were all reviewed and documented as reviewed in the EPIC chart.  ROS:  Feeling well.  No urinary tract symptoms. GYN ROS: Negative other than as described in the HPI.  Physical Exam  BP 122/72   LMP 03/21/2002  General: Pleasant female, no acute distress, alert and oriented PELVIC EXAM: VULVA: normal appearing vulva with no masses, tenderness or lesions, VAGINA: normal appearing vagina with normal color and discharge, no lesions, muscular notch at posterior fourchette/perineum makes it difficult to remove pessary CERVIX: normal appearing cervix without discharge or lesions, UTERUS: uterus is normal size, shape, consistency and nontender, ADNEXA: normal adnexa in size, nontender and no masses  Pessary removed with moderate discomfort due to the difficulty in removing it past the muscular tissue at the posterior fourchette.  The pessary is cleansed and reinserted with minimal difficulty.  Chaperone present during the exam  Assessment 69 y.o. G1P0010 with uterine prolapse using a ring pessary with good results.  Plan Recommend returning in 3 months for pessary maintenance.  Fair amount of difficulty with pessary removal today due to pelvic anatomy and the tight posterior fourchette.  Returning in 3 months may not completely prevent this issue.  She finds pessary has been working well for her, but does have difficulty with these exams.  At this time she is not considering a  hysterectomy.  She will call or follow-up sooner if any issues with the pessary.   Joseph Pierini MD 05/13/19

## 2019-06-03 ENCOUNTER — Other Ambulatory Visit: Payer: Self-pay | Admitting: Hematology

## 2019-08-08 ENCOUNTER — Ambulatory Visit (INDEPENDENT_AMBULATORY_CARE_PROVIDER_SITE_OTHER): Payer: Medicare Other | Admitting: Obstetrics and Gynecology

## 2019-08-08 ENCOUNTER — Encounter: Payer: Self-pay | Admitting: Obstetrics and Gynecology

## 2019-08-08 ENCOUNTER — Other Ambulatory Visit: Payer: Self-pay

## 2019-08-08 VITALS — BP 122/76

## 2019-08-08 DIAGNOSIS — R350 Frequency of micturition: Secondary | ICD-10-CM

## 2019-08-08 DIAGNOSIS — Z4689 Encounter for fitting and adjustment of other specified devices: Secondary | ICD-10-CM | POA: Diagnosis not present

## 2019-08-08 NOTE — Progress Notes (Signed)
   Tammy Levine  1950/07/25 [id number]  HPI The patient is a 69 y.o. G1P0010 who presents today for a pessary check.  She has a history of uterine prolapse and has been using a #2 ring pessary with support.  No vaginal bleeding nor abnormal discharge.  She does describe intermittent urinary frequency helped by taking Azo (phenazopyridine) as needed or twice a week.  No malodorous urine or painful urination.  The urinary frequency is not a constant problem.  She does live a physically active lifestyle to maintain health including weight resistance exercise and aerobic activity.  Past medical history,surgical history, problem list, medications, allergies, family history and social history were all reviewed and documented as reviewed in the EPIC chart.  ROS:  Feeling well.  + urinary tract symptoms. GYN ROS: Negative other than as described in the HPI.  Physical Exam  BP 122/76   LMP 03/21/2002  General: Pleasant female, no acute distress, alert and oriented PELVIC EXAM: VULVA: normal appearing vulva with no masses, tenderness or lesions, VAGINA: normal appearing vagina with normal color and discharge, no lesions, muscular notch at posterior fourchette/perineum makes it difficult to remove pessary CERVIX: normal appearing cervix without discharge or lesions, UTERUS: uterus is normal size, shape, consistency and nontender, ADNEXA: normal adnexa in size, nontender and no masses  Pessary removed with moderate discomfort due to the difficulty in removing it past the muscular tissue at the posterior fourchette.  The pessary is cleansed and reinserted with minimal difficulty.  Urinalysis with 40-60 WBC, 0-2 RBC, 0-5 squamous epithelial cells/hpf, 2+ leukocyte esterase, negative nitrate, cloudy in appearance.  [name] present during the exam  Assessment 69 y.o. G1P0010 with uterine prolapse using a ring pessary with good results.  Plan Patient does continue to experience difficulty with  pessary removal due to pelvic anatomy and the tight posterior fourchette (nulliparous).  She finds the pessary has been overall working well for her, but does have difficulty with these exams.  She does inquire briefly about surgical management options again today, we briefly discussed possibility of performing a vaginal hysterectomy and prolapse repair, but this does have a high pelvic organ prolapse recurrence risk.  The urinalysis today indicates possible UTI, which would help to explain her increased urinary frequency, but we will await the culture results before deciding how to treat given her symptoms are only intermittent and the pyuria could be partially due to contamination from vaginal discharge with the pessary in place.  Recommend returning in 4 months for repeat pessary maintenance, she should return sooner if any problems before then.   Joseph Pierini MD 08/08/19

## 2019-08-11 LAB — URINE CULTURE
MICRO NUMBER:: 10501000
SPECIMEN QUALITY:: ADEQUATE

## 2019-08-11 LAB — URINALYSIS, COMPLETE W/RFL CULTURE
Bilirubin Urine: NEGATIVE
Glucose, UA: NEGATIVE
Hyaline Cast: NONE SEEN /LPF
Ketones, ur: NEGATIVE
Nitrites, Initial: NEGATIVE
Protein, ur: NEGATIVE
Specific Gravity, Urine: 1.025 (ref 1.001–1.03)
pH: 5 (ref 5.0–8.0)

## 2019-08-11 LAB — CULTURE INDICATED

## 2019-08-12 MED ORDER — SULFAMETHOXAZOLE-TRIMETHOPRIM 400-80 MG PO TABS: 2.0000 | ORAL_TABLET | Freq: Two times a day (BID) | ORAL | 0 refills | Status: AC

## 2019-08-12 NOTE — Addendum Note (Signed)
Addended by: Joseph Pierini D on: 08/12/2019 03:59 PM   Modules accepted: Orders

## 2019-10-14 ENCOUNTER — Other Ambulatory Visit: Payer: Self-pay

## 2019-10-14 ENCOUNTER — Ambulatory Visit
Admission: RE | Admit: 2019-10-14 | Discharge: 2019-10-14 | Disposition: A | Discharge status: Home / Self Care | Payer: Medicare Other | Source: Ambulatory Visit | Attending: Hematology | Admitting: Hematology

## 2019-10-14 DIAGNOSIS — R922 Inconclusive mammogram: Secondary | ICD-10-CM | POA: Diagnosis not present

## 2019-10-14 DIAGNOSIS — C50212 Malignant neoplasm of upper-inner quadrant of left female breast: Secondary | ICD-10-CM

## 2019-10-14 DIAGNOSIS — Z853 Personal history of malignant neoplasm of breast: Secondary | ICD-10-CM | POA: Diagnosis not present

## 2019-11-07 ENCOUNTER — Other Ambulatory Visit: Payer: Self-pay

## 2019-11-07 ENCOUNTER — Inpatient Hospital Stay (HOSPITAL_BASED_OUTPATIENT_CLINIC_OR_DEPARTMENT_OTHER): Payer: Medicare Other | Admitting: Nurse Practitioner

## 2019-11-07 ENCOUNTER — Inpatient Hospital Stay: Payer: Medicare Other | Attending: Nurse Practitioner

## 2019-11-07 ENCOUNTER — Encounter: Payer: Self-pay | Admitting: Nurse Practitioner

## 2019-11-07 ENCOUNTER — Inpatient Hospital Stay: Payer: Medicare Other

## 2019-11-07 VITALS — BP 173/93 | HR 67 | Temp 97.8°F | Resp 16 | Ht 62.5 in | Wt 116.2 lb

## 2019-11-07 DIAGNOSIS — Z79811 Long term (current) use of aromatase inhibitors: Secondary | ICD-10-CM | POA: Insufficient documentation

## 2019-11-07 DIAGNOSIS — Z170 Estrogen receptor positive status [ER+]: Secondary | ICD-10-CM | POA: Insufficient documentation

## 2019-11-07 DIAGNOSIS — M810 Age-related osteoporosis without current pathological fracture: Secondary | ICD-10-CM | POA: Diagnosis not present

## 2019-11-07 DIAGNOSIS — C50212 Malignant neoplasm of upper-inner quadrant of left female breast: Secondary | ICD-10-CM

## 2019-11-07 DIAGNOSIS — Z923 Personal history of irradiation: Secondary | ICD-10-CM | POA: Insufficient documentation

## 2019-11-07 DIAGNOSIS — Z79899 Other long term (current) drug therapy: Secondary | ICD-10-CM | POA: Diagnosis not present

## 2019-11-07 DIAGNOSIS — I10 Essential (primary) hypertension: Secondary | ICD-10-CM | POA: Diagnosis not present

## 2019-11-07 DIAGNOSIS — N8110 Cystocele, unspecified: Secondary | ICD-10-CM | POA: Diagnosis not present

## 2019-11-07 DIAGNOSIS — Z791 Long term (current) use of non-steroidal anti-inflammatories (NSAID): Secondary | ICD-10-CM | POA: Insufficient documentation

## 2019-11-07 LAB — CBC WITH DIFFERENTIAL (CANCER CENTER ONLY)
Abs Immature Granulocytes: 0.01 10*3/uL (ref 0.00–0.07)
Basophils Absolute: 0 10*3/uL (ref 0.0–0.1)
Basophils Relative: 1 %
Eosinophils Absolute: 0.3 10*3/uL (ref 0.0–0.5)
Eosinophils Relative: 5 %
HCT: 40.6 % (ref 36.0–46.0)
Hemoglobin: 13 g/dL (ref 12.0–15.0)
Immature Granulocytes: 0 %
Lymphocytes Relative: 30 %
Lymphs Abs: 1.7 10*3/uL (ref 0.7–4.0)
MCH: 27.5 pg (ref 26.0–34.0)
MCHC: 32 g/dL (ref 30.0–36.0)
MCV: 86 fL (ref 80.0–100.0)
Monocytes Absolute: 0.6 10*3/uL (ref 0.1–1.0)
Monocytes Relative: 10 %
Neutro Abs: 3.1 10*3/uL (ref 1.7–7.7)
Neutrophils Relative %: 54 %
Platelet Count: 238 10*3/uL (ref 150–400)
RBC: 4.72 MIL/uL (ref 3.87–5.11)
RDW: 13.4 % (ref 11.5–15.5)
WBC Count: 5.7 10*3/uL (ref 4.0–10.5)
nRBC: 0 % (ref 0.0–0.2)

## 2019-11-07 LAB — CMP (CANCER CENTER ONLY)
ALT: 20 U/L (ref 0–44)
AST: 23 U/L (ref 15–41)
Albumin: 4 g/dL (ref 3.5–5.0)
Alkaline Phosphatase: 54 U/L (ref 38–126)
Anion gap: 7 (ref 5–15)
BUN: 21 mg/dL (ref 8–23)
CO2: 25 mmol/L (ref 22–32)
Calcium: 9.7 mg/dL (ref 8.9–10.3)
Chloride: 107 mmol/L (ref 98–111)
Creatinine: 0.74 mg/dL (ref 0.44–1.00)
GFR, Est AFR Am: >60 mL/min (ref >60)
GFR, Estimated: >60 mL/min (ref >60)
Glucose, Bld: 95 mg/dL (ref 70–99)
Potassium: 3.9 mmol/L (ref 3.5–5.1)
Sodium: 139 mmol/L (ref 135–145)
Total Bilirubin: 0.4 mg/dL (ref 0.3–1.2)
Total Protein: 7.4 g/dL (ref 6.5–8.1)

## 2019-11-07 MED ORDER — SODIUM CHLORIDE 0.9 % IV SOLN
Freq: Once | INTRAVENOUS | Status: AC
  Filled 2019-11-07: qty 250

## 2019-11-07 MED ORDER — ZOLEDRONIC ACID 4 MG/100ML IV SOLN
4.0000 mg | Freq: Once | INTRAVENOUS | Status: AC
  Administered 2019-11-07: 4 mg via INTRAVENOUS

## 2019-11-07 MED ORDER — ANASTROZOLE 1 MG PO TABS: 1.0000 mg | ORAL_TABLET | Freq: Every day | ORAL | 3 refills | Status: DC

## 2019-11-07 MED ORDER — ZOLEDRONIC ACID 4 MG/100ML IV SOLN
INTRAVENOUS | Status: AC
  Filled 2019-11-07: qty 100

## 2019-11-07 NOTE — Progress Notes (Signed)
[place]   [phone] [phone]   Clinic Follow up Note   Patient Care Team: Meccariello, Bernita Raisin, DO as PCP - General (Family Medicine) Jovita Kussmaul, MD as Consulting Physician (General Surgery) Truitt Merle, MD as Consulting Physician (Hematology) Gery Pray, MD as Consulting Physician (Radiation Oncology) Alla Feeling, NP as Nurse Practitioner (Nurse Practitioner) 11/07/2019  CHIEF COMPLAINT: F/u left breast cancer   SUMMARY OF ONCOLOGIC HISTORY: Oncology History Overview Note  Cancer Staging Malignant neoplasm of upper-inner quadrant of left breast in female, estrogen receptor positive ([place]) Staging form: Breast, AJCC 8th Edition - Clinical stage from 10/12/2017: Stage IB (cT2, cN0, cM0, G2, ER+, PR+, HER2-) - Signed by Truitt Merle, MD on 10/18/2017 - Pathologic stage from 11/01/2017: Stage IA (pT2, pN0, cM0, G3, ER+, PR+, HER2-, Oncotype DX score: 2) - Signed by Truitt Merle, MD on 01/03/2018     Malignant neoplasm of upper-inner quadrant of left breast in female, estrogen receptor positive ([place])  10/09/2017 Mammogram   Diagnostic Mammogram 10/09/17 IMPRESSION: 3.2 x 3.0 x 2.0 cm mass with imaging features highly suspicious for malignancy in the 11 o'clock position of the left breast.   10/11/2017 Initial Biopsy   Diagnosis 10/11/17 Breast, left, needle core biopsy, upper inner 11 o'clock - INVASIVE DUCTAL CARCINOMA WITH PAPILLARY FEATURES, SEE COMMENT.    10/11/2017 Receptors her2   Estrogen Receptor: 100%, POSITIVE, STRONG STAINING INTENSITY Progesterone Receptor: 80%, POSITIVE, STRONG STAINING INTENSITY Proliferation Marker Ki67: 15% HER2 - NEGATIVE   10/12/2017 Cancer Staging   Staging form: Breast, AJCC 8th Edition - Clinical stage from 10/12/2017: Stage IB (cT2, cN0, cM0, G2, ER+, PR+, HER2-) - Signed by Truitt Merle, MD on 10/18/2017   10/17/2017 Initial Diagnosis   Malignant neoplasm of upper-inner quadrant of left breast in  female, estrogen receptor positive ([place])   11/01/2017 Surgery    She had left breast lumpectomy with Dr. Marlou Starks on 11/01/2017   11/01/2017 Pathology Results   11/01/2017 Surgical Pathology Diagnosis 1. Lymph node, sentinel, biopsy, Left - NO CARCINOMA IDENTIFIED IN ONE LYMPH NODE (0/1) 2. Lymph node, sentinel, biopsy, Left - NO CARCINOMA IDENTIFIED IN ONE LYMPH NODE (0/1) 3. Lymph node, sentinel, biopsy, Left - NO CARCINOMA IDENTIFIED IN ONE LYMPH NODE (0/1) 4. Breast, lumpectomy, Left - INVASIVE MAMMARY CARCINOMA, WITH PAPILLARY AND MICROPAPILLARY FEATURES, NOTTINGHAM GRADE 3 OF 3, 3.1 CM - MARGINS UNINVOLVED BY CARCINOMA (0.2 CM; POSTERIOR MARGIN) - SEE ONCOLOGY TABLE AND COMMENT BELOW   11/01/2017 Oncotype testing   RS Score 2 Distant recurrence risk at 9 years 3% Group average absolute chemo benefit <1%   11/01/2017 Cancer Staging   Staging form: Breast, AJCC 8th Edition - Pathologic stage from 11/01/2017: Stage IA (pT2, pN0, cM0, G3, ER+, PR+, HER2-, Oncotype DX score: 2) - Signed by Truitt Merle, MD on 01/03/2018   12/12/2017 - 01/05/2018 Radiation Therapy   Daily radiation therapy with Dr. Sondra Come Radiation treatment dates:   12/11/17 - 01/05/18  Site/dose:  Total of 50.05 Gy in 20 fractions 1. Breast, Left/ 40.05 Gy delivered in 15 fractions of 2.67 Gy 2. Boost/ 10 Gy delivered in 5 fractions of 2 Gy  Beams/energy:    1. 3D, Photon/ 6X 2. Isodose Plan/ 6X  Narrative: The patient tolerated radiation treatment relatively well. She reported cording to the axilla that resolved over several days. By the end of treatments, she reported mild fatigue and slight sensitivity to the breast. Nurse note reported nipple was red and sensitive, and  skin slightly hyperpigmented with rash appearance.     03/2018 -  Anti-estrogen oral therapy   Tamoxifen $RemoveBe'20mg'MDpiOtEMu$  daily starting 03/2018. D/c on 08/08/18 due to vaginal discharge. Start Anastrozole in 08/2018.    07/18/2018 Survivorship   SCP per Cira Rue, NP      CURRENT THERAPY:  -Tamoxifen $RemoveBef'20mg'FRuHtbGnBs$  daily starting 03/2018. D/c on 08/08/18 due to vaginal discharge. Start Anastrozole in 08/2018. -Zometa every 6 months for 2 years starting 11/08/18  INTERVAL HISTORY: Ms. Bierly returns for f/u as scheduled. She was last seen 05/10/19. Mammogram in July was negative.  She feels very well overall.  She eats a healthy plant-based diet, engages in routine exercise.  She continues anastrozole.  Denies hot flashes, bone pain.  She has no changes in her bowel habits, bleeding, nausea, vomiting, constipation, diarrhea, abdominal pain, fever, chills, cough, chest pain, dyspnea.  Denies changes or concerns in her breast.   MEDICAL HISTORY:  Past Medical History:  Diagnosis Date  . Breast cancer, left ([place]) 09/2017  . Hypertension   . Personal history of radiation therapy     SURGICAL HISTORY: Past Surgical History:  Procedure Laterality Date  . BLEPHAROPLASTY     lower eyelid  . BREAST LUMPECTOMY    . BREAST LUMPECTOMY WITH AXILLARY LYMPH NODE BIOPSY Left 11/01/2017   Procedure: BREAST LUMPECTOMY WITH SENTINEL LYMPH NODE MAPPING;  Surgeon: Jovita Kussmaul, MD;  Location: [facility];  Service: General;  Laterality: Left;    I have reviewed the social history and family history with the patient and they are unchanged from previous note.  ALLERGIES:  has No Known Allergies.  MEDICATIONS:  Current Outpatient Medications  Medication Sig Dispense Refill  . anastrozole (ARIMIDEX) 1 MG tablet Take 1 tablet (1 mg total) by mouth daily. 90 tablet 3  . Calcium Carbonate-Vit D-Min (CALCIUM 1200) 1200-1000 MG-UNIT CHEW Chew by mouth.    Marland Kitchen ibuprofen (ADVIL,MOTRIN) 200 MG tablet Take 200 mg by mouth every 6 (six) hours as needed.    Marland Kitchen losartan (COZAAR) 25 MG tablet Take 1 tablet (25 mg total) by mouth daily. 90 tablet 3  . naproxen sodium (ALEVE) 220 MG tablet Take 220 mg by mouth daily as needed.    . vitamin C (ASCORBIC ACID) 500 MG  tablet Take 500 mg by mouth daily.     No current facility-administered medications for this visit.    PHYSICAL EXAMINATION: ECOG PERFORMANCE STATUS: 0 - Asymptomatic  Vitals:   11/07/19 1128  BP: (!) 173/93  Pulse: 67  Resp: 16  Temp: 97.8 F (36.6 C)  SpO2: 100%   Filed Weights   11/07/19 1128  Weight: 116 lb 3.2 oz (52.7 kg)    GENERAL:alert, no distress and comfortable SKIN: No rash to exposed skin EYES:  sclera clear NECK: Without mass LUNGS: clear with normal breathing effort HEART: regular rate & rhythm, no lower extremity edema NEURO: alert & oriented x 3 with fluent speech, no focal motor/sensory deficits Breast exam: S/p left lumpectomy, incisions completely healed with mild scar tissue at the breast incision.  No palpable mass in either breast or axilla that I could appreciate.  LABORATORY DATA:  I have reviewed the data as listed CBC Latest Ref Rng & Units 11/07/2019 05/10/2019 11/08/2018  WBC 4.0 - 10.5 K/uL 5.7 4.6 5.1  Hemoglobin 12.0 - 15.0 g/dL 13.0 13.4 12.4  Hematocrit 36 - 46 % 40.6 41.8 39.5  Platelets 150 - 400 K/uL 238 246 252  CMP Latest Ref Rng & Units 11/07/2019 05/10/2019 11/08/2018  Glucose 70 - 99 mg/dL 95 96 95  BUN 8 - 23 mg/dL $Remove'21 21 21  'SOStCuF$ Creatinine 0.44 - 1.00 mg/dL 0.74 0.73 0.75  Sodium 135 - 145 mmol/L 139 140 139  Potassium 3.5 - 5.1 mmol/L 3.9 3.7 4.0  Chloride 98 - 111 mmol/L 107 107 108  CO2 22 - 32 mmol/L $RemoveB'25 23 22  'lxGGaUrq$ Calcium 8.9 - 10.3 mg/dL 9.7 9.1 8.9  Total Protein 6.5 - 8.1 g/dL 7.4 7.6 7.0  Total Bilirubin 0.3 - 1.2 mg/dL 0.4 0.4 0.3  Alkaline Phos 38 - 126 U/L 54 43 58  AST 15 - 41 U/L $Remo'23 25 22  'WviPI$ ALT 0 - 44 U/L $Remo'20 26 17      'JYdoq$ RADIOGRAPHIC STUDIES: I have personally reviewed the radiological images as listed and agreed with the findings in the report. No results found.   ASSESSMENT & PLAN: Jeliyah Middlebrooks is a 69 y.o. female with   1. Malignant neoplasm of upper-inner quadrant of left breast, Stage IA,pT2N0M0,  ER/PR: (+), HER2 (-), Grade III, RS 2 -She was diagnosed in 09/2017. She is s/p left breastlumpectomyand adjuvant radiation.  -HerOncotype report which showed low riskdisease withrecurrence score of 2 and with anti-estrogen therapy alone. There is less than 1% benefit of adjuvant chemotherapy, chemotherapy was not recommended -She started anti-estrogen therapy with Tamoxifen in 03/2018, tolerated well initiallybut then developed vaginal discharge andslight of uterine or bladder prolapse.Tamoxifen  was stopped and changed to anastrozole in 08/2018, tolerating well overall -Mammogram in 09/2019 is negative -Continue surveillance  2.Osteoporosis -Her 03/2018 DEXA shows osteoporosis with lowest T-score of -3.6 at [facility]. She is at high risk for fracture. -Due to poor toleration herTamoxifenwas switched to Anastrozole which can weakenher bone.  -Started her on Zometa infusion every 6 months for 2 years on 11/08/18. Tolerating well.   No dental issues -She will continue calcium and VitaminD. Next DEXA in 03/2020  3. Hypertension  -On losartan -Follow-up with PCP  4. Uterine or bladder prolapse -Pessary in place  Disposition Ms. Persaud is clinically doing well.  Breast exam is benign.  Labs are unremarkable.  Mammogram from July 2021 is negative.  Overall no clinical concern for recurrence.  We will continue surveillance and anastrozole.  We reviewed the CBC and CMP from today.  She will proceed with another Zometa infusion.  She will return for lab, surveillance visit, and next infusion in 6 months.  She plans to call her PCP about her blood pressure today.  All questions were answered. The patient knows to call the clinic with any problems, questions or concerns. No barriers to learning was detected.     Alla Feeling, NP 11/07/19

## 2019-11-07 NOTE — Patient Instructions (Signed)
Zoledronic Acid injection (Hypercalcemia, Oncology) What is this medicine? ZOLEDRONIC ACID (ZOE le dron ik AS id) lowers the amount of calcium loss from bone. It is used to treat too much calcium in your blood from cancer. It is also used to prevent complications of cancer that has spread to the bone. This medicine may be used for other purposes; ask your health care provider or pharmacist if you have questions. COMMON BRAND NAME(S): Zometa What should I tell my health care provider before I take this medicine? They need to know if you have any of these conditions:  aspirin-sensitive asthma  cancer, especially if you are receiving medicines used to treat cancer  dental disease or wear dentures  infection  kidney disease  receiving corticosteroids like dexamethasone or prednisone  an unusual or allergic reaction to zoledronic acid, other medicines, foods, dyes, or preservatives  pregnant or trying to get pregnant  breast-feeding How should I use this medicine? This medicine is for infusion into a vein. It is given by a health care professional in a hospital or clinic setting. Talk to your pediatrician regarding the use of this medicine in children. Special care may be needed. Overdosage: If you think you have taken too much of this medicine contact a poison control center or emergency room at once. NOTE: This medicine is only for you. Do not share this medicine with others. What if I miss a dose? It is important not to miss your dose. Call your doctor or health care professional if you are unable to keep an appointment. What may interact with this medicine?  certain antibiotics given by injection  NSAIDs, medicines for pain and inflammation, like ibuprofen or naproxen  some diuretics like bumetanide, furosemide  teriparatide  thalidomide This list may not describe all possible interactions. Give your health care provider a list of all the medicines, herbs, non-prescription  drugs, or dietary supplements you use. Also tell them if you smoke, drink alcohol, or use illegal drugs. Some items may interact with your medicine. What should I watch for while using this medicine? Visit your doctor or health care professional for regular checkups. It may be some time before you see the benefit from this medicine. Do not stop taking your medicine unless your doctor tells you to. Your doctor may order blood tests or other tests to see how you are doing. Women should inform their doctor if they wish to become pregnant or think they might be pregnant. There is a potential for serious side effects to an unborn child. Talk to your health care professional or pharmacist for more information. You should make sure that you get enough calcium and vitamin D while you are taking this medicine. Discuss the foods you eat and the vitamins you take with your health care professional. Some people who take this medicine have severe bone, joint, and/or muscle pain. This medicine may also increase your risk for jaw problems or a broken thigh bone. Tell your doctor right away if you have severe pain in your jaw, bones, joints, or muscles. Tell your doctor if you have any pain that does not go away or that gets worse. Tell your dentist and dental surgeon that you are taking this medicine. You should not have major dental surgery while on this medicine. See your dentist to have a dental exam and fix any dental problems before starting this medicine. Take good care of your teeth while on this medicine. Make sure you see your dentist for regular follow-up   appointments. What side effects may I notice from receiving this medicine? Side effects that you should report to your doctor or health care professional as soon as possible:  allergic reactions like skin rash, itching or hives, swelling of the face, lips, or tongue  anxiety, confusion, or depression  breathing problems  changes in vision  eye  pain  feeling faint or lightheaded, falls  jaw pain, especially after dental work  mouth sores  muscle cramps, stiffness, or weakness  redness, blistering, peeling or loosening of the skin, including inside the mouth  trouble passing urine or change in the amount of urine Side effects that usually do not require medical attention (report to your doctor or health care professional if they continue or are bothersome):  bone, joint, or muscle pain  constipation  diarrhea  fever  hair loss  irritation at site where injected  loss of appetite  nausea, vomiting  stomach upset  trouble sleeping  trouble swallowing  weak or tired This list may not describe all possible side effects. Call your doctor for medical advice about side effects. You may report side effects to FDA at 1-800-FDA-1088. Where should I keep my medicine? This drug is given in a hospital or clinic and will not be stored at home. NOTE: This sheet is a summary. It may not cover all possible information. If you have questions about this medicine, talk to your doctor, pharmacist, or health care provider.  2020 Elsevier/Gold Standard (2013-08-03 14:19:39)  

## 2019-11-08 ENCOUNTER — Telehealth: Payer: Self-pay | Admitting: Nurse Practitioner

## 2019-11-08 NOTE — Telephone Encounter (Signed)
Scheduled per 8/19 los. Left voicemail with appt time and date.

## 2019-11-11 NOTE — Progress Notes (Deleted)
    SUBJECTIVE:   CHIEF COMPLAINT / HPI:   ***  PERTINENT  PMH / PSH: ***  OBJECTIVE:   LMP 03/21/2002   ***  ASSESSMENT/PLAN:   No problem-specific Assessment & Plan notes found for this encounter.     Cleophas Dunker, [place]

## 2019-11-12 ENCOUNTER — Ambulatory Visit: Payer: Medicare Other | Admitting: Family Medicine

## 2019-11-18 ENCOUNTER — Other Ambulatory Visit: Payer: Self-pay

## 2019-11-18 ENCOUNTER — Ambulatory Visit (INDEPENDENT_AMBULATORY_CARE_PROVIDER_SITE_OTHER): Payer: Medicare Other | Admitting: Family Medicine

## 2019-11-18 VITALS — BP 120/74 | HR 85 | Wt 114.0 lb

## 2019-11-18 DIAGNOSIS — Z7189 Other specified counseling: Secondary | ICD-10-CM

## 2019-11-18 DIAGNOSIS — I10 Essential (primary) hypertension: Secondary | ICD-10-CM

## 2019-11-18 DIAGNOSIS — Z170 Estrogen receptor positive status [ER+]: Secondary | ICD-10-CM

## 2019-11-18 DIAGNOSIS — C50212 Malignant neoplasm of upper-inner quadrant of left female breast: Secondary | ICD-10-CM | POA: Diagnosis not present

## 2019-11-18 DIAGNOSIS — F411 Generalized anxiety disorder: Secondary | ICD-10-CM | POA: Diagnosis not present

## 2019-11-18 DIAGNOSIS — L988 Other specified disorders of the skin and subcutaneous tissue: Secondary | ICD-10-CM | POA: Diagnosis not present

## 2019-11-18 DIAGNOSIS — B351 Tinea unguium: Secondary | ICD-10-CM | POA: Diagnosis not present

## 2019-11-18 DIAGNOSIS — Z7185 Encounter for immunization safety counseling: Secondary | ICD-10-CM

## 2019-11-18 MED ORDER — TRETINOIN 0.1 % EX CREA: TOPICAL_CREAM | Freq: Every day | CUTANEOUS | 2 refills | Status: DC

## 2019-11-18 MED ORDER — ALPRAZOLAM 0.25 MG PO TABS: 0.2500 mg | ORAL_TABLET | Freq: Two times a day (BID) | ORAL | 0 refills | Status: DC | PRN

## 2019-11-18 NOTE — Progress Notes (Signed)
SUBJECTIVE:   CHIEF COMPLAINT / HPI:   HTN Current regimen: Cozaar 25 mg daily Last BMP 11/07/2019 at [facility], within normal limits Last lipid panel 11/24/2017 LDL 124 BP has been high when using a machine at her oncologists office  She states that when her BP is manual, it is always normal She goes to the gym and does weight bearing exercises  History of breast cancer Last seen on 8/19 Continue with antiestrogen therapy, anastrozole Sees oncologist q69months Mammogram performed in July 2021 which was negative  Left toenail fingus and loss of toenail Patient reports that many years ago she had a fungus of her left toenail, her toenail fell off, she is wondering if this will grow back  Uterine prolapse and anxiety Follows with gynecologist Has a pessary Sees q3 months Patient has significant anxiety around having her pessary removed and replaced She had a prior prescription from when she was being treated for breast cancer of alprazolam and found that taking 2 prior to having her pessary exchanged, she is able to relax which makes the process easier for her physician She is wondering if she can have a prescription for a few tablets to use when she goes for her pessary exchange  COVID Booster Given that patient has a history of cancer and was previously receiving radiation therapy, she is wondering if she meets qualifications for Covid booster for immunocompromised patients  Wrinkled skin Patient has been using Retin-A 0.1% cream daily at bedtime for wrinkles on her skin, she is asking for written prescription for this so that she can shop around for the best price as it is not covered by her insurance Patient is postmenopausal   PERTINENT  PMH / PSH: HTN, osteoporosis, uterine prolapse, history of left breast cancer estrogen receptor positive on anastrozole  OBJECTIVE:   BP 120/74   Pulse 85   Wt 114 lb (51.7 kg)   LMP 03/21/2002   SpO2 97%   BMI 20.52 kg/m     Physical Exam:  General: 69 y.o. female in NAD Cardio: RRR no m/r/g Lungs: CTAB, no wheezing, no rhonchi, no crackles, no IWOB on RA Abdomen: Soft, non-tender to palpation, non-distended, positive bowel sounds Skin: warm and dry, wrinkles on forehead Extremities: No edema, left great toe without nail, mild erythema and scabbing at cuticle Psych: mood and affect appropriate for circumstance, appropriate dress, no SI   ASSESSMENT/PLAN:   Essential hypertension BP when checked manually and here in office today is well controlled and at goal.  Patient is worried because when it is checked with the machine, she has elevated levels.  Given concerns that it is possible she has truly elevated blood pressure that fluctuates, will send patient for ambulatory blood pressure monitoring with Dr. Valentina Lucks.  Patient agrees to this plan, will see Dr. Valentina Lucks, then come back after words to see PCP.  For now, we will continue with current regimen of losartan 25 mg daily.  Patient recently had BMP, no labs needed today.  Malignant neoplasm of upper-inner quadrant of left breast in female, estrogen receptor positive ([place]) Follows with oncology every 6 months.  Currently on anastrozole.  Last mammogram performed July 2021 which was negative.  Continue to follow with oncology.  Onychomycosis Given that she does not have a left great toenail at this time, feel that she would best be served to be seen by podiatry, patient agrees.  Referral placed to podiatry.  Anxiety state PMP reviewed, no red flags, patient  only has 1 prior prescription for Xanax that was around the time of her treatment for cancer.  Seems appropriate.  Discussed with preceptor Dr. Andria Frames, who agrees.  Can prescribe 12 tablets of 0.25 mg alprazolam for patient to use as needed for anxiety around pessary removal and replacement.  Vaccine counseling CDC guidelines read and assessed with patient.  Given that she is not currently on cancer therapy  that causes her to be immunocompromised, do not believe that she meets criteria for current COVID-19 vaccination booster.  When releases to general public, patient would likely benefit from a booster.  Wrinkled skin Tretinoin 0.1% cream prescribed for nightly use, written prescription given.     Cleophas Dunker, [place]

## 2019-11-18 NOTE — Patient Instructions (Addendum)
Thank you for coming to see me today. It was a pleasure. Today we talked about:   Make an appointment with Dr. Valentina Lucks for a morning appointment for ambulatory blood pressure monitoring.  I have placed a referral to Podiatry for your toenail.  If you do not hear from them in the next 2 weeks, please give [name] a call.  I have sent a refill for the alprazolam and printed the prescription for tretinoin.  Please follow-up with me in 1 month to have your blood pressure.  If you have any questions or concerns, please do not hesitate to call the office at [phone].  Best,   [place] Constable, DO

## 2019-11-19 DIAGNOSIS — B351 Tinea unguium: Secondary | ICD-10-CM | POA: Insufficient documentation

## 2019-11-19 DIAGNOSIS — L988 Other specified disorders of the skin and subcutaneous tissue: Secondary | ICD-10-CM | POA: Insufficient documentation

## 2019-11-19 DIAGNOSIS — F411 Generalized anxiety disorder: Secondary | ICD-10-CM | POA: Insufficient documentation

## 2019-11-19 DIAGNOSIS — Z7185 Encounter for immunization safety counseling: Secondary | ICD-10-CM | POA: Insufficient documentation

## 2019-11-19 NOTE — Assessment & Plan Note (Signed)
Tretinoin 0.1% cream prescribed for nightly use, written prescription given.

## 2019-11-19 NOTE — Assessment & Plan Note (Signed)
BP when checked manually and here in office today is well controlled and at goal.  Patient is worried because when it is checked with the machine, she has elevated levels.  Given concerns that it is possible she has truly elevated blood pressure that fluctuates, will send patient for ambulatory blood pressure monitoring with Dr. Valentina Lucks.  Patient agrees to this plan, will see Dr. Valentina Lucks, then come back after words to see PCP.  For now, we will continue with current regimen of losartan 25 mg daily.  Patient recently had BMP, no labs needed today.

## 2019-11-19 NOTE — Assessment & Plan Note (Signed)
Follows with oncology every 6 months.  Currently on anastrozole.  Last mammogram performed July 2021 which was negative.  Continue to follow with oncology.

## 2019-11-19 NOTE — Assessment & Plan Note (Signed)
CDC guidelines read and assessed with patient.  Given that she is not currently on cancer therapy that causes her to be immunocompromised, do not believe that she meets criteria for current COVID-19 vaccination booster.  When releases to general public, patient would likely benefit from a booster.

## 2019-11-19 NOTE — Assessment & Plan Note (Signed)
Given that she does not have a left great toenail at this time, feel that she would best be served to be seen by podiatry, patient agrees.  Referral placed to podiatry.

## 2019-11-19 NOTE — Assessment & Plan Note (Signed)
PMP reviewed, no red flags, patient only has 1 prior prescription for Xanax that was around the time of her treatment for cancer.  Seems appropriate.  Discussed with preceptor Dr. Andria Frames, who agrees.  Can prescribe 12 tablets of 0.25 mg alprazolam for patient to use as needed for anxiety around pessary removal and replacement.

## 2019-11-26 ENCOUNTER — Other Ambulatory Visit: Payer: Self-pay

## 2019-11-26 ENCOUNTER — Ambulatory Visit (INDEPENDENT_AMBULATORY_CARE_PROVIDER_SITE_OTHER): Payer: Medicare Other | Admitting: Pharmacist

## 2019-11-26 ENCOUNTER — Encounter: Payer: Self-pay | Admitting: Pharmacist

## 2019-11-26 DIAGNOSIS — I10 Essential (primary) hypertension: Secondary | ICD-10-CM

## 2019-11-26 NOTE — Patient Instructions (Addendum)
Wearing the Blood Pressure Monitor  The cuff will inflate every 20 minutes during the day and every 30 minutes while you sleep.  Your blood pressure readings will NOT display after cuff inflation  Fill out the blood pressure-activity diary during the day, especially during activities that may affect your reading -- such as exercise, stress, walking, taking your blood pressure medications  Important things to know:  Avoid taking the monitor off for the next 24 hours, unless it causes you discomfort or pain.  Do NOT get the monitor wet and do NOT dry to clean the monitor with any cleaning products.  Do NOT put the monitor on anyone else's arm.  When the cuff inflates, avoid excess movement. Let the cuffed arm hang loosely, slightly away from the body. Avoid flexing the muscles or moving the hand/fingers.  When you go to sleep, make sure that the hose is not kinked.  Remember to fill out the blood pressure activity diary.  If you experience severe pain or unusual pain (not associated with getting your blood pressure checked), remove the monitor.  Troubleshooting:  Code  Troubleshooting   1  Check cuff position, tighten cuff   2, 3  Remain still during reading   4, 87  Check air hose connections and make sure cuff is tight   85, 89  Check hose connections and make tubing is not crimped   86  Push START/STOP to restart reading   88, 91  Retry by pushing START/STOP   90  Replace batteries. If problem persists, remove monitor and bring back to   clinic at follow up   97, 98, 99  Service required - Remove monitor and bring back to clinic at follow up   Blood Pressure Activity Diary Time Lying down/ Sleeping Walking/ Exercise Stressed/ Angry Headache/ Pain Dizzy  9 AM       10 AM       11 AM       12 PM       1 PM       2 PM       Time Lying down/ Sleeping Walking/ Exercise Stressed/ Angry Headache/ Pain Dizzy  3 PM       4 PM        5 PM       6 PM       7 PM       8 PM        Time Lying down/ Sleeping Walking/ Exercise Stressed/ Angry Headache/ Pain Dizzy  9 PM       10 PM       11 PM       12 AM       1 AM       2 AM       3 AM       Time Lying down/ Sleeping Walking/ Exercise Stressed/ Angry Headache/ Pain Dizzy  4 AM       5 AM       6 AM       7 AM       8 AM       9 AM       10 AM        Time you woke up: _________                  Time you went to sleep:__________    Come back tomorrow at 8:30AM  to have  the monitor removed  Call the [facility] if you have any questions before then [phone])    Note: 9/8 BP within goal range for majority of the awaking hours. No change to blood pressure medication regimen.  Continue losaratan 25mg  once daily.

## 2019-11-26 NOTE — Progress Notes (Signed)
S:    Patient arrives good spirits, ambulating without assitance.    Presents to the clinic for ambulatory blood pressure evaluation.   Patient was referred and last seen by Primary Care Provider on 8/30.   Diagnosed with Hypertension in the year of 2019.    Medication compliance is reported to be good.  Discussed procedure for wearing the monitor and gave patient written instructions. Monitor was placed on non-dominant arm with instructions to return in the morning.   Current BP Medications include:  Losartan 25 mg daily  Antihypertensives tried in the past include: none   Returned on 9/8 at ~ 9:00 AM  Stated she was unable to complete entire interval of wearing the Amb BP monitor.   She reported not wearing during 12:00-1:30 PM and then stopped using prior to bedtime.    O:  Physical Exam Constitutional:      Appearance: Normal appearance. She is normal weight.  Cardiovascular:     Rate and Rhythm: Normal rate.  Pulmonary:     Effort: Pulmonary effort is normal.  Neurological:     Mental Status: She is alert.  Psychiatric:        Mood and Affect: Mood normal.        Behavior: Behavior normal.        Thought Content: Thought content normal.        Judgment: Judgment normal.    Review of Systems  All other systems reviewed and are negative.   Last 3 Office BP readings: BP Readings from Last 3 Encounters:  11/26/19 (!) 155/72  11/18/19 120/74  11/07/19 (!) 173/93     Clinical Atherosclerotic Cardiovascular Disease (ASCVD): No  The 10-year ASCVD risk score Mikey Bussing DC Jr., et al., 2013) is: 14%   Values used to calculate the score:     Age: 58 years     Sex: Female     Is Non-Hispanic African American: No     Diabetic: No     Tobacco smoker: No     Systolic Blood Pressure: 517 mmHg     Is BP treated: Yes     HDL Cholesterol: 68 mg/dL     Total Cholesterol: 207 mg/dL  Basic Metabolic Panel    Component Value Date/Time   NA 139 11/07/2019 1108   NA 142  12/26/2017 1555   K 3.9 11/07/2019 1108   CL 107 11/07/2019 1108   CO2 25 11/07/2019 1108   GLUCOSE 95 11/07/2019 1108   BUN 21 11/07/2019 1108   BUN 23 12/26/2017 1555   CREATININE 0.74 11/07/2019 1108   CALCIUM 9.7 11/07/2019 1108   GFRNONAA >60 11/07/2019 1108   GFRAA >60 11/07/2019 1108    Renal function: Estimated Creatinine Clearance: 54.5 mL/min (by C-G formula based on SCr of 0.74 mg/dL).   Today's Office Blood Pressure (BP) reading: 155/72 mmHg (manual reading)  ABPM Study Data: Arm Placement right arm   Overall Mean 24hr BP:   128/77 mmHg HR: 64   Daytime Mean BP:  128/77 mmHg HR: 64   Nighttime Mean BP:  N/A - did not complete overnight assessment period   Dipping Pattern:  Unknown, did not complete overnight assessment period    Non-hypertensive ABPM thresholds: daytime BP <125/75 mmHg, sleeptime BP <120/70 mmHg    A/P: History of hypertension since 2019 found to have white coat hypertension (blood presssure at goal).  Given 24-hour ambulatory blood pressure demonstrates daytime (awake) readings at an average blood pressure of 128/77 mmHg.  (  no nocturnal assessment)  -No changes to medications at this time. Continue losartan 25mg  daily.     Results reviewed and written information provided.  Total time in face-to-face counseling 22 minutes.   F/U Clinic Visit with Dr. Sandi Carne.  Patient seen with Fara Olden, PharmD - PGY-1 Resident.

## 2019-11-27 NOTE — Assessment & Plan Note (Signed)
History of hypertension since 2019 found to have white coat hypertension (blood presssure at goal).  Given 24-hour ambulatory blood pressure demonstrates daytime (awake) readings at an average blood pressure of 128/77 mmHg.  (no nocturnal assessment)  -No changes to medications at this time. Continue losartan 25mg  daily.

## 2019-12-02 NOTE — Progress Notes (Signed)
Reviewed: I agree with Dr. Koval's documentation and management. 

## 2019-12-04 ENCOUNTER — Other Ambulatory Visit: Payer: Self-pay

## 2019-12-04 ENCOUNTER — Ambulatory Visit (INDEPENDENT_AMBULATORY_CARE_PROVIDER_SITE_OTHER): Payer: Medicare Other | Admitting: Podiatry

## 2019-12-04 DIAGNOSIS — L603 Nail dystrophy: Secondary | ICD-10-CM | POA: Diagnosis not present

## 2019-12-04 NOTE — Progress Notes (Signed)
   HPI: 69 y.o. female presenting today as a new patient for evaluation of bilateral great toe nail plates.  Patient states that she has a history of total permanent nail avulsion to the left hallux nail plate.  She continues to get some slight regrowth of the nail which can become thick.  She applies antifungal topical OTC with minimal improvement.  She would like to have it evaluated. Patient also states that approximately 3 months ago she injured her right hallux nail plate.  Ever since then there has been a disruption of the nail plate and she is concerned.  She presents for further treatment and evaluation  Past Medical History:  Diagnosis Date  . Breast cancer, left ([place]) 09/2017  . Hypertension   . Personal history of radiation therapy      Physical Exam: General: The patient is alert and oriented x3 in no acute distress.  Dermatology: Skin is warm, dry and supple bilateral lower extremities. Negative for open lesions or macerations.  Absence of the left hallux nail plate noted.  Subungual of nail bed appears completely healed.  I do not identify any regrowth of the hallux nail plate. There is some disruption with thickening and discoloration of the right hallux nail plate.  Findings consistent with trauma.  Negative for any significant pain on palpation.  Vascular: Palpable pedal pulses bilaterally. No edema or erythema noted. Capillary refill within normal limits.  Neurological: Epicritic and protective threshold grossly intact bilaterally.   Musculoskeletal Exam: Range of motion within normal limits to all pedal and ankle joints bilateral. Muscle strength 5/5 in all groups bilateral.   Assessment: 1.  Dystrophic nail right hallux secondary to trauma 2.  Absence of left hallux nail plate.  History of total nail matricectomy   Plan of Care:  1. Patient evaluated.  2.  Recommend OTC urea 40% cream or lotion applied to the bilateral great toenails as the nail softener 3.   Recommend good supportive shoes that do not irritate her of the areas 4.  Return to clinic as needed      Edrick Kins, DPM [facility]  Dr. Edrick Kins, DPM    [address], [place] [address]                Office [phone]  Fax [phone]

## 2019-12-09 ENCOUNTER — Encounter: Payer: Self-pay | Admitting: Obstetrics and Gynecology

## 2019-12-09 ENCOUNTER — Other Ambulatory Visit: Payer: Self-pay

## 2019-12-09 ENCOUNTER — Ambulatory Visit (INDEPENDENT_AMBULATORY_CARE_PROVIDER_SITE_OTHER): Payer: Medicare Other | Admitting: Obstetrics and Gynecology

## 2019-12-09 VITALS — BP 118/74

## 2019-12-09 DIAGNOSIS — Z4689 Encounter for fitting and adjustment of other specified devices: Secondary | ICD-10-CM

## 2019-12-09 DIAGNOSIS — N814 Uterovaginal prolapse, unspecified: Secondary | ICD-10-CM

## 2019-12-09 NOTE — Progress Notes (Signed)
   Tammy Levine  Nov 12, 1950 [id number]  HPI The patient is a 69 y.o. G1P0010 who presents today for a pessary check.  She has a history of uterine prolapse and has been using a #2 ring pessary with support.  No vaginal bleeding nor abnormal discharge.  No urinary symptoms.  No pain or discomfort from the pessary.  Past medical history,surgical history, problem list, medications, allergies, family history and social history were all reviewed and documented as reviewed in the EPIC chart.   Physical Exam  BP 118/74   LMP 03/21/2002  PELVIC EXAM: VULVA: normal appearing vulva with no masses, tenderness or lesions, VAGINA: normal appearing vagina with normal color and discharge, no lesions, no erosions, muscular notch at posterior fourchette/perineum makes it difficult to remove pessary CERVIX: normal appearing cervix without discharge or lesions, UTERUS: uterus is normal size, shape, consistency and nontender, ADNEXA: normal adnexa in size, nontender and no masses  Pessary removed with moderate discomfort due to the difficulty in removing it past the muscular tissue at the posterior fourchette.  The pessary is cleansed and reinserted with minimal difficulty.  Tammy Levine present during the exam  Assessment 69 y.o. G1P0010 with uterine prolapse using a ring pessary with good results.  Plan Patient does continue to experience difficulty with pessary removal due to pelvic anatomy and the tight posterior fourchette (nulliparous).  She finds the pessary has been overall working well for her, and seemed to do a little better today with pessary removal with gentle digital retraction of the posterior fourchette.  Still possibly considering surgery but would like to think about it more.  Will let Tammy Levine know if she wants to move forward with that or has any questions.  Recommend returning in 3-4 months for repeat pessary maintenance, she should return sooner if any problems before then.   Tammy Pierini  MD 12/09/19

## 2019-12-26 DIAGNOSIS — Z23 Encounter for immunization: Secondary | ICD-10-CM | POA: Diagnosis not present

## 2020-01-14 DIAGNOSIS — Z23 Encounter for immunization: Secondary | ICD-10-CM | POA: Diagnosis not present

## 2020-01-21 ENCOUNTER — Other Ambulatory Visit: Payer: Self-pay | Admitting: *Deleted

## 2020-01-21 DIAGNOSIS — I10 Essential (primary) hypertension: Secondary | ICD-10-CM

## 2020-01-21 MED ORDER — LOSARTAN POTASSIUM 25 MG PO TABS: 25.0000 mg | ORAL_TABLET | Freq: Every day | ORAL | 3 refills | Status: DC

## 2020-03-25 ENCOUNTER — Other Ambulatory Visit: Payer: Self-pay

## 2020-03-25 ENCOUNTER — Ambulatory Visit (INDEPENDENT_AMBULATORY_CARE_PROVIDER_SITE_OTHER): Payer: Medicare Other | Admitting: Obstetrics and Gynecology

## 2020-03-25 ENCOUNTER — Encounter: Payer: Self-pay | Admitting: Obstetrics and Gynecology

## 2020-03-25 VITALS — BP 122/80

## 2020-03-25 DIAGNOSIS — Z4689 Encounter for fitting and adjustment of other specified devices: Secondary | ICD-10-CM

## 2020-03-25 DIAGNOSIS — N814 Uterovaginal prolapse, unspecified: Secondary | ICD-10-CM | POA: Diagnosis not present

## 2020-03-25 NOTE — Progress Notes (Signed)
   Tammy Levine  [birth year] [id number]  HPI The patient is a 69 y.o. G1P0010 who presents today for a pessary check.  She has a history of uterine prolapse and has been using a #2 ring pessary with support.  No vaginal bleeding nor significant discharge.  No urinary symptoms.  No pain or discomfort from the pessary.  Today the patient did take anxiolytic medication before this examination.  Past medical history,surgical history, problem list, medications, allergies, family history and social history were all reviewed and documented as reviewed in the EPIC chart.   Physical Exam  BP 122/80 (BP Location: Right Arm, Patient Position: Sitting, Cuff Size: Normal)   LMP 03/21/2002  PELVIC EXAM: VULVA: normal appearing vulva with no masses, tenderness or lesions, VAGINA: normal appearing vagina with normal color and discharge, no lesions, no erosions, muscular notch at posterior fourchette/perineum makes it difficult to remove pessary CERVIX: normal appearing cervix without discharge or lesions, UTERUS: uterus is normal size, shape, consistency and nontender, ADNEXA: normal adnexa in size, nontender and no masses  Pessary removed with less patient discomfort today, the muscular tissue at the posterior fourchette was gently pushed posteriorly to allow more easy removal of the pessary.  Speculum examination performed and vaginal cavity appears normal.  The pessary is cleansed and reinserted with minimal difficulty.  Bari Mantis Bonham present during the exam  Assessment 70 y.o. G1P0010 with uterine prolapse using a ring pessary with good results.  Plan Patient has historically experienced difficulty with pessary removal due to pelvic anatomy and the tight posterior fourchette (nulliparous), but today did much better taking anti-anxiety medication beforehand.  She finds the pessary has been overall working well for her, and seemed to again do better today with the pessary removal using gentle digital  retraction of the posterior fourchette.  Still possibly considering surgical correction.  I did notify her that one of my colleagues would be assuming her care as I will only be at the practice until March 2022.  Recommend returning in 3 months for repeat pessary maintenance, she should return sooner if any problems before then.    Theresia Majors MD 03/25/20

## 2020-05-06 NOTE — Progress Notes (Signed)
[place]   Telephone:([phone]) [phone] Fax:(336) [phone]   Clinic Follow up Note   Patient Care Team: Meccariello, Bernita Raisin, DO as PCP - General (Family Medicine) Jovita Kussmaul, MD as Consulting Physician (General Surgery) Truitt Merle, MD as Consulting Physician (Hematology) Gery Pray, MD as Consulting Physician (Radiation Oncology) Alla Feeling, NP as Nurse Practitioner (Nurse Practitioner)  Date of Service:  05/08/2020  CHIEF COMPLAINT: F/u of left breast cancer  SUMMARY OF ONCOLOGIC HISTORY: Oncology History Overview Note  Cancer Staging Malignant neoplasm of upper-inner quadrant of left breast in female, estrogen receptor positive ([place]) Staging form: Breast, AJCC 8th Edition - Clinical stage from 10/12/2017: Stage IB (cT2, cN0, cM0, G2, ER+, PR+, HER2-) - Signed by Truitt Merle, MD on 10/18/2017 - Pathologic stage from 11/01/2017: Stage IA (pT2, pN0, cM0, G3, ER+, PR+, HER2-, Oncotype DX score: 2) - Signed by Truitt Merle, MD on 01/03/2018     Malignant neoplasm of upper-inner quadrant of left breast in female, estrogen receptor positive ([place])  10/09/2017 Mammogram   Diagnostic Mammogram 10/09/17 IMPRESSION: 3.2 x 3.0 x 2.0 cm mass with imaging features highly suspicious for malignancy in the 11 o'clock position of the left breast.   10/11/2017 Initial Biopsy   Diagnosis 10/11/17 Breast, left, needle core biopsy, upper inner 11 o'clock - INVASIVE DUCTAL CARCINOMA WITH PAPILLARY FEATURES, SEE COMMENT.    10/11/2017 Receptors her2   Estrogen Receptor: 100%, POSITIVE, STRONG STAINING INTENSITY Progesterone Receptor: 80%, POSITIVE, STRONG STAINING INTENSITY Proliferation Marker Ki67: 15% HER2 - NEGATIVE   10/12/2017 Cancer Staging   Staging form: Breast, AJCC 8th Edition - Clinical stage from 10/12/2017: Stage IB (cT2, cN0, cM0, G2, ER+, PR+, HER2-) - Signed by Truitt Merle, MD on 10/18/2017   10/17/2017 Initial Diagnosis   Malignant neoplasm of upper-inner quadrant  of left breast in female, estrogen receptor positive ([place])   11/01/2017 Surgery    She had left breast lumpectomy with Dr. Marlou Starks on 11/01/2017   11/01/2017 Pathology Results   11/01/2017 Surgical Pathology Diagnosis 1. Lymph node, sentinel, biopsy, Left - NO CARCINOMA IDENTIFIED IN ONE LYMPH NODE (0/1) 2. Lymph node, sentinel, biopsy, Left - NO CARCINOMA IDENTIFIED IN ONE LYMPH NODE (0/1) 3. Lymph node, sentinel, biopsy, Left - NO CARCINOMA IDENTIFIED IN ONE LYMPH NODE (0/1) 4. Breast, lumpectomy, Left - INVASIVE MAMMARY CARCINOMA, WITH PAPILLARY AND MICROPAPILLARY FEATURES, NOTTINGHAM GRADE 3 OF 3, 3.1 CM - MARGINS UNINVOLVED BY CARCINOMA (0.2 CM; POSTERIOR MARGIN) - SEE ONCOLOGY TABLE AND COMMENT BELOW   11/01/2017 Oncotype testing   RS Score 2 Distant recurrence risk at 9 years 3% Group average absolute chemo benefit <1%   11/01/2017 Cancer Staging   Staging form: Breast, AJCC 8th Edition - Pathologic stage from 11/01/2017: Stage IA (pT2, pN0, cM0, G3, ER+, PR+, HER2-, Oncotype DX score: 2) - Signed by Truitt Merle, MD on 01/03/2018   12/12/2017 - 01/05/2018 Radiation Therapy   Daily radiation therapy with Dr. Sondra Come Radiation treatment dates:   12/11/17 - 01/05/18  Site/dose:  Total of 50.05 Gy in 20 fractions 1. Breast, Left/ 40.05 Gy delivered in 15 fractions of 2.67 Gy 2. Boost/ 10 Gy delivered in 5 fractions of 2 Gy  Beams/energy:    1. 3D, Photon/ 6X 2. Isodose Plan/ 6X  Narrative: The patient tolerated radiation treatment relatively well. She reported cording to the axilla that resolved over several days. By the end of treatments, she reported mild fatigue and slight sensitivity to the breast. Nurse note reported nipple  was red and sensitive, and skin slightly hyperpigmented with rash appearance.     03/2018 -  Anti-estrogen oral therapy   Tamoxifen $RemoveBe'20mg'PRZvyQbdU$  daily starting 03/2018. D/c on 08/08/18 due to vaginal discharge. Start Anastrozole in 08/2018.    07/18/2018 Survivorship    SCP per Cira Rue, NP    11/08/2018 - 05/08/2020 Antibody Plan   Zometa for osteoporosis every 6 months for 2 years, 11/08/18-05/08/20      CURRENT THERAPY:  -Tamoxifen $RemoveBef'20mg'TPlgTmVKvW$  daily starting 03/2018. D/c on 08/08/18 due to vaginal discharge. Start Anastrozole in 08/2018. -Zometa every 6 months for 2 years, 11/08/18-05/08/20  INTERVAL HISTORY:  Tammy Levine is here for a follow up of left breast cancer. She was last seen by me 1 year ago and seen by NP Lacie 6 months ago in interim. She presents to the clinic alone. She notes she is doing very well. She denies any new changes in the past 6 months. She denies any pain and denies any concerns. She notes her energy level is not the same but she attributes this to her age. She is still able to maintain her weight and activity level. She notes this is the first time hearing about her fullness of neck. She has not had her TSH checked before. She is interested in more workup.    REVIEW OF SYSTEMS:   Constitutional: Denies fevers, chills or abnormal weight loss Eyes: Denies blurriness of vision Ears, nose, mouth, throat, and face: Denies mucositis or sore throat Respiratory: Denies cough, dyspnea or wheezes Cardiovascular: Denies palpitation, chest discomfort or lower extremity swelling Gastrointestinal:  Denies nausea, heartburn or change in bowel habits Skin: Denies abnormal skin rashes Lymphatics: Denies new lymphadenopathy or easy bruising Neurological:Denies numbness, tingling or new weaknesses Behavioral/Psych: Mood is stable, no new changes  All other systems were reviewed with the patient and are negative.  MEDICAL HISTORY:  Past Medical History:  Diagnosis Date  . Breast cancer, left ([place]) 09/2017  . Hypertension   . Personal history of radiation therapy     SURGICAL HISTORY: Past Surgical History:  Procedure Laterality Date  . BLEPHAROPLASTY     lower eyelid  . BREAST LUMPECTOMY    . BREAST LUMPECTOMY WITH AXILLARY LYMPH  NODE BIOPSY Left 11/01/2017   Procedure: BREAST LUMPECTOMY WITH SENTINEL LYMPH NODE MAPPING;  Surgeon: Jovita Kussmaul, MD;  Location: [facility];  Service: General;  Laterality: Left;    I have reviewed the social history and family history with the patient and they are unchanged from previous note.  ALLERGIES:  has No Known Allergies.  MEDICATIONS:  Current Outpatient Medications  Medication Sig Dispense Refill  . anastrozole (ARIMIDEX) 1 MG tablet Take 1 tablet (1 mg total) by mouth daily. 90 tablet 3  . Calcium Carbonate-Vit D-Min (CALCIUM 1200) 1200-1000 MG-UNIT CHEW Chew by mouth.    . losartan (COZAAR) 25 MG tablet Take 1 tablet (25 mg total) by mouth daily. 90 tablet 3  . naproxen sodium (ALEVE) 220 MG tablet Take 220 mg by mouth daily as needed.    . tretinoin (RETIN-A) 0.1 % cream Apply topically at bedtime. 45 g 2  . vitamin C (ASCORBIC ACID) 500 MG tablet Take 500 mg by mouth daily.     No current facility-administered medications for this visit.   Facility-Administered Medications Ordered in Other Visits  Medication Dose Route Frequency Provider Last Rate Last Admin  . Zoledronic Acid (ZOMETA) IVPB 4 mg  4 mg Intravenous Once Truitt Merle, MD 400  mL/hr at 05/08/20 1126 4 mg at 05/08/20 1126    PHYSICAL EXAMINATION: ECOG PERFORMANCE STATUS: 0 - Asymptomatic  Vitals:   05/08/20 1024  BP: (!) 177/89  Pulse: 65  Temp: (!) 97.5 F (36.4 C)  SpO2: 100%   Filed Weights   05/08/20 1024  Weight: 117 lb (53.1 kg)    GENERAL:alert, no distress and comfortable SKIN: skin color, texture, turgor are normal, no rashes or significant lesions EYES: normal, Conjunctiva are pink and non-injected, sclera clear  NECK: non-tender, a palpable 2cm nodule in right anterior mid neck possible LN or thyroid gland  LYMPH:  no palpable lymphadenopathy in the cervical, axillary  LUNGS: clear to auscultation and percussion with normal breathing effort HEART: regular rate &  rhythm and no murmurs and no lower extremity edema ABDOMEN:abdomen soft, non-tender and normal bowel sounds Musculoskeletal:no cyanosis of digits and no clubbing  NEURO: alert & oriented x 3 with fluent speech, no focal motor/sensory deficits BREAST: S/p left lumpectomy: Surgical incision healed well, mildly tender. No palpable mass, nodules or adenopathy bilaterally. Right Breast exam benign.   LABORATORY DATA:  I have reviewed the data as listed CBC Latest Ref Rng & Units 05/08/2020 11/07/2019 05/10/2019  WBC 4.0 - 10.5 K/uL 5.1 5.7 4.6  Hemoglobin 12.0 - 15.0 g/dL 12.6 13.0 13.4  Hematocrit 36.0 - 46.0 % 40.0 40.6 41.8  Platelets 150 - 400 K/uL 225 238 246     CMP Latest Ref Rng & Units 05/08/2020 11/07/2019 05/10/2019  Glucose 70 - 99 mg/dL 95 95 96  BUN 8 - 23 mg/dL 26(H) 21 21  Creatinine 0.44 - 1.00 mg/dL 0.77 0.74 0.73  Sodium 135 - 145 mmol/L 138 139 140  Potassium 3.5 - 5.1 mmol/L 3.6 3.9 3.7  Chloride 98 - 111 mmol/L 107 107 107  CO2 22 - 32 mmol/L $RemoveB'24 25 23  'oeBHMoCU$ Calcium 8.9 - 10.3 mg/dL 8.7(L) 9.7 9.1  Total Protein 6.5 - 8.1 g/dL 7.2 7.4 7.6  Total Bilirubin 0.3 - 1.2 mg/dL 0.4 0.4 0.4  Alkaline Phos 38 - 126 U/L 50 54 43  AST 15 - 41 U/L $Remo'20 23 25  'SFFWw$ ALT 0 - 44 U/L $Remo'15 20 26      'fuVXU$ RADIOGRAPHIC STUDIES: I have personally reviewed the radiological images as listed and agreed with the findings in the report. No results found.   ASSESSMENT & PLAN:  Jaicey Sweaney is a 70 y.o. female with    1. Malignant neoplasm of upper-inner quadrant of left breast, Stage IA,pT2N0M0, ER/PR: (+), HER2 (-), Grade III, RS 2 -She was diagnosed in 09/2017. She is s/p left breastlumpectomyand adjuvant radiation. With Oncotype RS 2, I did not recommend adjuvant chemotherapy.  -She started anti-estrogen therapy with Tamoxifen in 03/2018, tolerated well initiallybut then developed vaginal discharge andslight of uterine or bladder prolapse.I switched her to Anastrozole in 08/2018. She is tolerating  well so far without issue.  -She is clinically doing well. Lab reviewed, her CBC and CMP are within normal limits, except BUN 26, Ca 8.7. Her physical exam and her 09/2019 mammogram were unremarkable. There is no clinical concern for recurrence. -Continue Surveillance. Next mammogram in 09/2020 -Continue anastrozole -F/u in 6 months. I encouraged her to continue her adequate exercise and balanced diet.   2.Osteoporosis -Her 03/2018 DEXA shows osteoporosis with lowest T-score of -3.6 at [facility]. She is at high risk for fracture. -Due to poor toleration herTamoxifenwas switched to Anastrozole which can weakenher bone.  -I started her on Zometa  infusion every 6 months for 2 years, 11/08/18-05/08/20. Tolerated well. I discussed if she needs it again in the future, we can repeat treatment.  -She will continue calcium and VitaminD. Next DEXA in 2-4 weeks.   3. Hypertension  -BP is elevated in clinic. She continues Losartan. Continue to f/u with PCP.   4. Uterine or bladder prolapse -She has been seen by Gyn Dr. Phineas Real and per his note she plans to have Hysterectomy once COVID19 improves.   5. Nodule in right anterior neck  -On exam today she was found to have a 2cm nodule in right mid anterior neck, possible LN or Thyroid (05/08/20). I recommend [name] for further evaluation. She is agreeable.  -I have low suspicion this is related to her breast cancer.  -will call her after [name]    PLAN: -Proceed with final Zometa today -[name] of neck [facility] in 1-2 weeks. Phone call one day after.  -Continue anastrozole  -DEXA at [facility] in 2-4 months  -Mammogram at [facility] in 09/2020  -Lab, f/u in6 months   No problem-specific Assessment & Plan notes found for this encounter.   Orders Placed This Encounter  Procedures  . MM DIAG BREAST TOMO BILATERAL    Standing Status:   Future    Standing Expiration Date:   05/08/2021    Order Specific Question:   Reason for Exam (SYMPTOM  OR DIAGNOSIS REQUIRED)     Answer:   screening    Order Specific Question:   Preferred imaging location?    Answer:   [facility]  . DG Bone Density    Standing Status:   Future    Standing Expiration Date:   05/08/2021    Order Specific Question:   Reason for Exam (SYMPTOM  OR DIAGNOSIS REQUIRED)    Answer:   screening    Order Specific Question:   Preferred imaging location?    Answer:   [facility]  . US Soft Tissue Head/Neck    Standing Status:   Future    Standing Expiration Date:   05/08/2021    Order Specific Question:   Reason for Exam (SYMPTOM  OR DIAGNOSIS REQUIRED)    Answer:   palpable soft tissue in right neck, rule out adenopathy or thyroid disease    Order Specific Question:   Preferred imaging location?    Answer:   [facility]   All questions were answered. The patient knows to call the clinic with any problems, questions or concerns. No barriers to learning was detected. The total time spent in the appointment was 30 minutes.     Truitt Merle, MD 05/08/2020   I, Joslyn Devon, am acting as scribe for Truitt Merle, MD.   I have reviewed the above documentation for accuracy and completeness, and I agree with the above.

## 2020-05-08 ENCOUNTER — Inpatient Hospital Stay: Payer: Medicare Other | Attending: Hematology

## 2020-05-08 ENCOUNTER — Other Ambulatory Visit: Payer: Self-pay

## 2020-05-08 ENCOUNTER — Telehealth: Payer: Self-pay | Admitting: Hematology

## 2020-05-08 ENCOUNTER — Inpatient Hospital Stay: Payer: Medicare Other

## 2020-05-08 ENCOUNTER — Inpatient Hospital Stay (HOSPITAL_BASED_OUTPATIENT_CLINIC_OR_DEPARTMENT_OTHER): Payer: Medicare Other | Admitting: Hematology

## 2020-05-08 ENCOUNTER — Encounter: Payer: Self-pay | Admitting: Hematology

## 2020-05-08 VITALS — BP 177/89 | HR 65 | Temp 97.5°F | Ht 62.5 in | Wt 117.0 lb

## 2020-05-08 DIAGNOSIS — I10 Essential (primary) hypertension: Secondary | ICD-10-CM | POA: Diagnosis not present

## 2020-05-08 DIAGNOSIS — C50212 Malignant neoplasm of upper-inner quadrant of left female breast: Secondary | ICD-10-CM

## 2020-05-08 DIAGNOSIS — Z923 Personal history of irradiation: Secondary | ICD-10-CM | POA: Insufficient documentation

## 2020-05-08 DIAGNOSIS — M810 Age-related osteoporosis without current pathological fracture: Secondary | ICD-10-CM | POA: Diagnosis not present

## 2020-05-08 DIAGNOSIS — E2839 Other primary ovarian failure: Secondary | ICD-10-CM

## 2020-05-08 DIAGNOSIS — Z79811 Long term (current) use of aromatase inhibitors: Secondary | ICD-10-CM | POA: Diagnosis not present

## 2020-05-08 DIAGNOSIS — Z170 Estrogen receptor positive status [ER+]: Secondary | ICD-10-CM | POA: Insufficient documentation

## 2020-05-08 DIAGNOSIS — Z79899 Other long term (current) drug therapy: Secondary | ICD-10-CM | POA: Insufficient documentation

## 2020-05-08 DIAGNOSIS — N8110 Cystocele, unspecified: Secondary | ICD-10-CM | POA: Diagnosis not present

## 2020-05-08 DIAGNOSIS — R221 Localized swelling, mass and lump, neck: Secondary | ICD-10-CM | POA: Diagnosis not present

## 2020-05-08 LAB — CMP (CANCER CENTER ONLY)
ALT: 15 U/L (ref 0–44)
AST: 20 U/L (ref 15–41)
Albumin: 4.1 g/dL (ref 3.5–5.0)
Alkaline Phosphatase: 50 U/L (ref 38–126)
Anion gap: 7 (ref 5–15)
BUN: 26 mg/dL — ABNORMAL HIGH (ref 8–23)
CO2: 24 mmol/L (ref 22–32)
Calcium: 8.7 mg/dL — ABNORMAL LOW (ref 8.9–10.3)
Chloride: 107 mmol/L (ref 98–111)
Creatinine: 0.77 mg/dL (ref 0.44–1.00)
GFR, Estimated: >60 mL/min (ref >60)
Glucose, Bld: 95 mg/dL (ref 70–99)
Potassium: 3.6 mmol/L (ref 3.5–5.1)
Sodium: 138 mmol/L (ref 135–145)
Total Bilirubin: 0.4 mg/dL (ref 0.3–1.2)
Total Protein: 7.2 g/dL (ref 6.5–8.1)

## 2020-05-08 LAB — CBC WITH DIFFERENTIAL (CANCER CENTER ONLY)
Abs Immature Granulocytes: 0.01 10*3/uL (ref 0.00–0.07)
Basophils Absolute: 0.1 10*3/uL (ref 0.0–0.1)
Basophils Relative: 1 %
Eosinophils Absolute: 0.1 10*3/uL (ref 0.0–0.5)
Eosinophils Relative: 2 %
HCT: 40 % (ref 36.0–46.0)
Hemoglobin: 12.6 g/dL (ref 12.0–15.0)
Immature Granulocytes: 0 %
Lymphocytes Relative: 29 %
Lymphs Abs: 1.5 10*3/uL (ref 0.7–4.0)
MCH: 27.3 pg (ref 26.0–34.0)
MCHC: 31.5 g/dL (ref 30.0–36.0)
MCV: 86.8 fL (ref 80.0–100.0)
Monocytes Absolute: 0.6 10*3/uL (ref 0.1–1.0)
Monocytes Relative: 12 %
Neutro Abs: 2.9 10*3/uL (ref 1.7–7.7)
Neutrophils Relative %: 56 %
Platelet Count: 225 10*3/uL (ref 150–400)
RBC: 4.61 MIL/uL (ref 3.87–5.11)
RDW: 13.5 % (ref 11.5–15.5)
WBC Count: 5.1 10*3/uL (ref 4.0–10.5)
nRBC: 0 % (ref 0.0–0.2)

## 2020-05-08 MED ORDER — ZOLEDRONIC ACID 4 MG/100ML IV SOLN
4.0000 mg | Freq: Once | INTRAVENOUS | Status: AC
  Administered 2020-05-08: 4 mg via INTRAVENOUS

## 2020-05-08 MED ORDER — SODIUM CHLORIDE 0.9 % IV SOLN
Freq: Once | INTRAVENOUS | Status: AC
Start: 2020-05-08 — End: 2020-05-08
  Filled 2020-05-08: qty 250

## 2020-05-08 MED ORDER — ZOLEDRONIC ACID 4 MG/100ML IV SOLN
INTRAVENOUS | Status: AC
  Filled 2020-05-08: qty 100

## 2020-05-08 NOTE — Telephone Encounter (Signed)
Scheduled appointments per 2/18 los. Spoke to patient who is aware of appointments dates and times. Gave patient calendar print out.

## 2020-05-08 NOTE — Patient Instructions (Signed)
Zoledronic Acid Injection (Hypercalcemia, Oncology) What is this medicine? ZOLEDRONIC ACID (ZOE le dron ik AS id) slows calcium loss from bones. It high calcium levels in the blood from some kinds of cancer. It may be used in other people at risk for bone loss. This medicine may be used for other purposes; ask your health care provider or pharmacist if you have questions. COMMON BRAND NAME(S): Zometa What should I tell my health care provider before I take this medicine? They need to know if you have any of these conditions:  cancer  dehydration  dental disease  kidney disease  liver disease  low levels of calcium in the blood  lung or breathing disease (asthma)  receiving steroids like dexamethasone or prednisone  an unusual or allergic reaction to zoledronic acid, other medicines, foods, dyes, or preservatives  pregnant or trying to get pregnant  breast-feeding How should I use this medicine? This drug is injected into a vein. It is given by a health care provider in a hospital or clinic setting. Talk to your health care provider about the use of this drug in children. Special care may be needed. Overdosage: If you think you have taken too much of this medicine contact a poison control center or emergency room at once. NOTE: This medicine is only for you. Do not share this medicine with others. What if I miss a dose? Keep appointments for follow-up doses. It is important not to miss your dose. Call your health care provider if you are unable to keep an appointment. What may interact with this medicine?  certain antibiotics given by injection  NSAIDs, medicines for pain and inflammation, like ibuprofen or naproxen  some diuretics like bumetanide, furosemide  teriparatide  thalidomide This list may not describe all possible interactions. Give your health care provider a list of all the medicines, herbs, non-prescription drugs, or dietary supplements you use. Also tell  them if you smoke, drink alcohol, or use illegal drugs. Some items may interact with your medicine. What should I watch for while using this medicine? Visit your health care provider for regular checks on your progress. It may be some time before you see the benefit from this drug. Some people who take this drug have severe bone, joint, or muscle pain. This drug may also increase your risk for jaw problems or a broken thigh bone. Tell your health care provider right away if you have severe pain in your jaw, bones, joints, or muscles. Tell you health care provider if you have any pain that does not go away or that gets worse. Tell your dentist and dental surgeon that you are taking this drug. You should not have major dental surgery while on this drug. See your dentist to have a dental exam and fix any dental problems before starting this drug. Take good care of your teeth while on this drug. Make sure you see your dentist for regular follow-up appointments. You should make sure you get enough calcium and vitamin D while you are taking this drug. Discuss the foods you eat and the vitamins you take with your health care provider. Check with your health care provider if you have severe diarrhea, nausea, and vomiting, or if you sweat a lot. The loss of too much body fluid may make it dangerous for you to take this drug. You may need blood work done while you are taking this drug. Do not become pregnant while taking this drug. Women should inform their health care provider   if they wish to become pregnant or think they might be pregnant. There is potential for serious harm to an unborn child. Talk to your health care provider for more information. What side effects may I notice from receiving this medicine? Side effects that you should report to your doctor or health care provider as soon as possible:  allergic reactions (skin rash, itching or hives; swelling of the face, lips, or tongue)  bone  pain  infection (fever, chills, cough, sore throat, pain or trouble passing urine)  jaw pain, especially after dental work  joint pain  kidney injury (trouble passing urine or change in the amount of urine)  low blood pressure (dizziness; feeling faint or lightheaded, falls; unusually weak or tired)  low calcium levels (fast heartbeat; muscle cramps or pain; pain, tingling, or numbness in the hands or feet; seizures)  low magnesium levels (fast, irregular heartbeat; muscle cramp or pain; muscle weakness; tremors; seizures)  low red blood cell counts (trouble breathing; feeling faint; lightheaded, falls; unusually weak or tired)  muscle pain  redness, blistering, peeling, or loosening of the skin, including inside the mouth  severe diarrhea  swelling of the ankles, feet, hands  trouble breathing Side effects that usually do not require medical attention (report to your doctor or health care provider if they continue or are bothersome):  anxious  constipation  coughing  depressed mood  eye irritation, itching, or pain  fever  general ill feeling or flu-like symptoms  nausea  pain, redness, or irritation at site where injected  trouble sleeping This list may not describe all possible side effects. Call your doctor for medical advice about side effects. You may report side effects to FDA at 1-800-FDA-1088. Where should I keep my medicine? This drug is given in a hospital or clinic. It will not be stored at home. NOTE: This sheet is a summary. It may not cover all possible information. If you have questions about this medicine, talk to your doctor, pharmacist, or health care provider.  2021 Elsevier/Gold Standard (2018-12-20 09:13:00)  

## 2020-05-13 ENCOUNTER — Other Ambulatory Visit: Payer: Self-pay

## 2020-05-13 ENCOUNTER — Other Ambulatory Visit: Payer: Self-pay | Admitting: Hematology

## 2020-05-13 ENCOUNTER — Ambulatory Visit (HOSPITAL_COMMUNITY)
Admission: RE | Admit: 2020-05-13 | Discharge: 2020-05-13 | Disposition: A | Discharge status: Home / Self Care | Payer: Medicare Other | Source: Ambulatory Visit | Attending: Hematology | Admitting: Hematology

## 2020-05-13 DIAGNOSIS — C50212 Malignant neoplasm of upper-inner quadrant of left female breast: Secondary | ICD-10-CM | POA: Insufficient documentation

## 2020-05-13 DIAGNOSIS — E079 Disorder of thyroid, unspecified: Secondary | ICD-10-CM | POA: Diagnosis not present

## 2020-05-13 DIAGNOSIS — E049 Nontoxic goiter, unspecified: Secondary | ICD-10-CM | POA: Diagnosis not present

## 2020-05-13 DIAGNOSIS — Z170 Estrogen receptor positive status [ER+]: Secondary | ICD-10-CM

## 2020-05-13 DIAGNOSIS — Z853 Personal history of malignant neoplasm of breast: Secondary | ICD-10-CM | POA: Diagnosis not present

## 2020-05-13 NOTE — Progress Notes (Signed)
[place]   Telephone:([phone] Fax:([phone]) [phone]   Clinic Follow up Note   Patient Care Team: Cleophas Dunker, DO as PCP - General (Family Medicine) Jovita Kussmaul, MD as Consulting Physician (General Surgery) Truitt Merle, MD as Consulting Physician (Hematology) Gery Pray, MD as Consulting Physician (Radiation Oncology) Alla Feeling, NP as Nurse Practitioner (Nurse Practitioner)   I connected with Tammy Levine on 05/15/2020 at  2:00 PM EST by telephone visit and verified that I am speaking with the correct person using two identifiers.  I discussed the limitations, risks, security and privacy concerns of performing an evaluation and management service by telephone and the availability of in person appointments. I also discussed with the patient that there may be a patient responsible charge related to this service. The patient expressed understanding and agreed to proceed.   Other persons participating in the visit and their role in the encounter:  None  Patient's location:  Her home  Provider's location:  My office   CHIEF COMPLAINT: discuss thyroid US result   SUMMARY OF ONCOLOGIC HISTORY: Oncology History Overview Note  Cancer Staging Malignant neoplasm of upper-inner quadrant of left breast in female, estrogen receptor positive ([place]) Staging form: Breast, AJCC 8th Edition - Clinical stage from 10/12/2017: Stage IB (cT2, cN0, cM0, G2, ER+, PR+, HER2-) - Signed by Truitt Merle, MD on 10/18/2017 - Pathologic stage from 11/01/2017: Stage IA (pT2, pN0, cM0, G3, ER+, PR+, HER2-, Oncotype DX score: 2) - Signed by Truitt Merle, MD on 01/03/2018     Malignant neoplasm of upper-inner quadrant of left breast in female, estrogen receptor positive ([place])  10/09/2017 Mammogram   Diagnostic Mammogram 10/09/17 IMPRESSION: 3.2 x 3.0 x 2.0 cm mass with imaging features highly suspicious for malignancy in the 11 o'clock position of the left breast.   10/11/2017 Initial  Biopsy   Diagnosis 10/11/17 Breast, left, needle core biopsy, upper inner 11 o'clock - INVASIVE DUCTAL CARCINOMA WITH PAPILLARY FEATURES, SEE COMMENT.    10/11/2017 Receptors her2   Estrogen Receptor: 100%, POSITIVE, STRONG STAINING INTENSITY Progesterone Receptor: 80%, POSITIVE, STRONG STAINING INTENSITY Proliferation Marker Ki67: 15% HER2 - NEGATIVE   10/12/2017 Cancer Staging   Staging form: Breast, AJCC 8th Edition - Clinical stage from 10/12/2017: Stage IB (cT2, cN0, cM0, G2, ER+, PR+, HER2-) - Signed by Truitt Merle, MD on 10/18/2017   10/17/2017 Initial Diagnosis   Malignant neoplasm of upper-inner quadrant of left breast in female, estrogen receptor positive ([place])   11/01/2017 Surgery    She had left breast lumpectomy with Dr. Marlou Starks on 11/01/2017   11/01/2017 Pathology Results   11/01/2017 Surgical Pathology Diagnosis 1. Lymph node, sentinel, biopsy, Left - NO CARCINOMA IDENTIFIED IN ONE LYMPH NODE (0/1) 2. Lymph node, sentinel, biopsy, Left - NO CARCINOMA IDENTIFIED IN ONE LYMPH NODE (0/1) 3. Lymph node, sentinel, biopsy, Left - NO CARCINOMA IDENTIFIED IN ONE LYMPH NODE (0/1) 4. Breast, lumpectomy, Left - INVASIVE MAMMARY CARCINOMA, WITH PAPILLARY AND MICROPAPILLARY FEATURES, NOTTINGHAM GRADE 3 OF 3, 3.1 CM - MARGINS UNINVOLVED BY CARCINOMA (0.2 CM; POSTERIOR MARGIN) - SEE ONCOLOGY TABLE AND COMMENT BELOW   11/01/2017 Oncotype testing   RS Score 2 Distant recurrence risk at 9 years 3% Group average absolute chemo benefit <1%   11/01/2017 Cancer Staging   Staging form: Breast, AJCC 8th Edition - Pathologic stage from 11/01/2017: Stage IA (pT2, pN0, cM0, G3, ER+, PR+, HER2-, Oncotype DX score: 2) - Signed by Truitt Merle, MD on 01/03/2018   12/12/2017 -  01/05/2018 Radiation Therapy   Daily radiation therapy with Dr. Sondra Come Radiation treatment dates:   12/11/17 - 01/05/18  Site/dose:  Total of 50.05 Gy in 20 fractions 1. Breast, Left/ 40.05 Gy delivered in 15 fractions of 2.67  Gy 2. Boost/ 10 Gy delivered in 5 fractions of 2 Gy  Beams/energy:    1. 3D, Photon/ 6X 2. Isodose Plan/ 6X  Narrative: The patient tolerated radiation treatment relatively well. She reported cording to the axilla that resolved over several days. By the end of treatments, she reported mild fatigue and slight sensitivity to the breast. Nurse note reported nipple was red and sensitive, and skin slightly hyperpigmented with rash appearance.     03/2018 -  Anti-estrogen oral therapy   Tamoxifen $RemoveBe'20mg'HWGCELpyg$  daily starting 03/2018. D/c on 08/08/18 due to vaginal discharge. Start Anastrozole in 08/2018.    07/18/2018 Survivorship   SCP per Cira Rue, NP    11/08/2018 - 05/08/2020 Antibody Plan   Zometa for osteoporosis every 6 months for 2 years, 11/08/18-05/08/20      CURRENT THERAPY:  -Tamoxifen $RemoveBef'20mg'aketngYlgZ$  daily starting 03/2018. D/c on 08/08/18 due to vaginal discharge. Start Anastrozole in 08/2018. -Zometa every 6 months for 2 years, 11/08/18-05/08/20  INTERVAL HISTORY:  Tammy Levine is scheduled for a phone visit to discuss her neck US result. She identified themselves by birth date.  She is clinically doing well, no new complains since last visit.   All other systems were reviewed with the patient and are negative.  MEDICAL HISTORY:  Past Medical History:  Diagnosis Date  . Breast cancer, left ([place]) 09/2017  . Hypertension   . Personal history of radiation therapy     SURGICAL HISTORY: Past Surgical History:  Procedure Laterality Date  . BLEPHAROPLASTY     lower eyelid  . BREAST LUMPECTOMY    . BREAST LUMPECTOMY WITH AXILLARY LYMPH NODE BIOPSY Left 11/01/2017   Procedure: BREAST LUMPECTOMY WITH SENTINEL LYMPH NODE MAPPING;  Surgeon: Jovita Kussmaul, MD;  Location: [facility];  Service: General;  Laterality: Left;    I have reviewed the social history and family history with the patient and they are unchanged from previous note.  ALLERGIES:  has No Known  Allergies.  MEDICATIONS:  Current Outpatient Medications  Medication Sig Dispense Refill  . anastrozole (ARIMIDEX) 1 MG tablet Take 1 tablet (1 mg total) by mouth daily. 90 tablet 3  . Calcium Carbonate-Vit D-Min (CALCIUM 1200) 1200-1000 MG-UNIT CHEW Chew by mouth.    . losartan (COZAAR) 25 MG tablet Take 1 tablet (25 mg total) by mouth daily. 90 tablet 3  . naproxen sodium (ALEVE) 220 MG tablet Take 220 mg by mouth daily as needed.    . tretinoin (RETIN-A) 0.1 % cream Apply topically at bedtime. 45 g 2  . vitamin C (ASCORBIC ACID) 500 MG tablet Take 500 mg by mouth daily.     No current facility-administered medications for this visit.    PHYSICAL EXAMINATION: ECOG PERFORMANCE STATUS: 0 - Asymptomatic  No vitals taken today, Exam not performed today   LABORATORY DATA:  I have reviewed the data as listed CBC Latest Ref Rng & Units 05/08/2020 11/07/2019 05/10/2019  WBC 4.0 - 10.5 K/uL 5.1 5.7 4.6  Hemoglobin 12.0 - 15.0 g/dL 12.6 13.0 13.4  Hematocrit 36.0 - 46.0 % 40.0 40.6 41.8  Platelets 150 - 400 K/uL 225 238 246     CMP Latest Ref Rng & Units 05/08/2020 11/07/2019 05/10/2019  Glucose 70 - 99 mg/dL  95 95 96  BUN 8 - 23 mg/dL 26(H) 21 21  Creatinine 0.44 - 1.00 mg/dL 0.77 0.74 0.73  Sodium 135 - 145 mmol/L 138 139 140  Potassium 3.5 - 5.1 mmol/L 3.6 3.9 3.7  Chloride 98 - 111 mmol/L 107 107 107  CO2 22 - 32 mmol/L $RemoveB'24 25 23  'YpLKfQTL$ Calcium 8.9 - 10.3 mg/dL 8.7(L) 9.7 9.1  Total Protein 6.5 - 8.1 g/dL 7.2 7.4 7.6  Total Bilirubin 0.3 - 1.2 mg/dL 0.4 0.4 0.4  Alkaline Phos 38 - 126 U/L 50 54 43  AST 15 - 41 U/L $Remo'20 23 25  'zgfRb$ ALT 0 - 44 U/L $Remo'15 20 26      'pDUya$ RADIOGRAPHIC STUDIES: I have personally reviewed the radiological images as listed and agreed with the findings in the report. US THYROID  Result Date: 05/14/2020 CLINICAL DATA:  Concern for right thyroid mass or adenopathy. History of breast cancer EXAM: THYROID ULTRASOUND TECHNIQUE: Ultrasound examination of the thyroid gland and  adjacent soft tissues was performed. COMPARISON:  None. FINDINGS: Parenchymal Echotexture: Markedly heterogenous Isthmus: 6 mm Right lobe: 5.8 x 2.8 x 3.1 cm Left lobe: 5.9 x 2.8 x 3.0 cm _________________________________________________________ Estimated total number of nodules >/= 1 cm: 0 Number of spongiform nodules >/=  2 cm not described below (TR1): 0 Number of mixed cystic and solid nodules >/= 1.5 cm not described below (TR2): 0 _________________________________________________________ Markedly heterogeneous and enlarged thyroid gland with areas of background pseudo nodularity. No measurable discrete nodule or focal abnormality. No hypervascularity. No regional bulky adenopathy. IMPRESSION: Markedly heterogeneous enlarged thyroid compatible with chronic medical thyroid disease. No discrete thyroid nodule or mass. No regional adenopathy The above is in keeping with the ACR TI-RADS recommendations - J Am Coll Radiol 2017;14:587-595. Electronically Signed   By: Jerilynn Mages.  Shick M.D.   On: 05/14/2020 10:15     ASSESSMENT & PLAN:  Tammy Levine is a 70 y.o. female with    1. Thyromegaly  -On 05/08/20 exam she was found to have a 2cm nodule in right mid anterior neck, possible LN or Thyroid -Her 05/13/20 US showed enlarged thyroid, no nodule or cervical adenopathy. I discussed there are multiple etiologies for goiter, such as iodine deficiency, thyroiditis, etc. This has likely has been there for some time and likely benign. She is asymptomatic  -I recommend she monitor her thyroid function routinely with PCP and consult with endocrinologist if needed.   2. Malignant neoplasm of upper-inner quadrant of left breast, Stage IA,pT2N0M0, ER/PR: (+), HER2 (-), Grade III, RS 2 -She was diagnosed in 09/2017. She is s/p left breastlumpectomyand adjuvant radiation. With Oncotype RS 2, I did not recommend adjuvant chemotherapy.  -She started anti-estrogen therapy with Tamoxifen in 03/2018, tolerated wellinitiallybut  then developed vaginal discharge andslight of uterine or bladder prolapse.I switched her to Anastrozole in 08/2018. She is tolerating well so far without issue.  -Continue Surveillance. Next mammogram in 09/2020 -Continue anastrozole -F/u in 6 months.  3.Osteoporosis -Her 03/2018 DEXA shows osteoporosis with lowest T-score of -3.6 at [facility]. She is at high risk for fracture. -Due to poor toleration herTamoxifenwasswitchedto Anastrozole which can weakenher bone.  -I started her on Zometa infusion every 6 months for 2 years, 11/08/18-05/08/20. Tolerated well.I discussed if she needs it again in the future, we can repeat treatment.  -She will continue calcium and VitaminD. Next DEXA on 10/15/20   PLAN: -I reviewed her neck US result -will send it to her PCP Dr. Sandi Carne for follow up and  checking her thyroid function  -RTC as scheduled last time    No problem-specific Assessment & Plan notes found for this encounter.   No orders of the defined types were placed in this encounter.  I discussed the assessment and treatment plan with the patient. The patient was provided an opportunity to ask questions and all were answered. The patient agreed with the plan and demonstrated an understanding of the instructions.  The patient was advised to call back or seek an in-person evaluation if the symptoms worsen or if the condition fails to improve as anticipated.  The total time spent in the appointment was 15 minutes.    Truitt Merle, MD 05/15/2020   I, Joslyn Devon, am acting as scribe for Truitt Merle, MD.   I have reviewed the above documentation for accuracy and completeness, and I agree with the above.

## 2020-05-15 ENCOUNTER — Encounter: Payer: Self-pay | Admitting: Hematology

## 2020-05-15 ENCOUNTER — Telehealth: Payer: Self-pay | Admitting: Hematology

## 2020-05-15 ENCOUNTER — Inpatient Hospital Stay (HOSPITAL_BASED_OUTPATIENT_CLINIC_OR_DEPARTMENT_OTHER): Payer: Medicare Other | Admitting: Hematology

## 2020-05-15 DIAGNOSIS — E010 Iodine-deficiency related diffuse (endemic) goiter: Secondary | ICD-10-CM

## 2020-05-15 DIAGNOSIS — E049 Nontoxic goiter, unspecified: Secondary | ICD-10-CM | POA: Insufficient documentation

## 2020-05-15 NOTE — Telephone Encounter (Signed)
Checked out appointment. No LOS notes needing to be scheduled. No changes made. 

## 2020-05-27 ENCOUNTER — Encounter: Payer: Self-pay | Admitting: Family Medicine

## 2020-05-27 ENCOUNTER — Other Ambulatory Visit: Payer: Self-pay

## 2020-05-27 ENCOUNTER — Ambulatory Visit (INDEPENDENT_AMBULATORY_CARE_PROVIDER_SITE_OTHER): Payer: Medicare Other | Admitting: Family Medicine

## 2020-05-27 VITALS — BP 159/84 | HR 63 | Ht 63.0 in | Wt 117.5 lb

## 2020-05-27 DIAGNOSIS — I10 Essential (primary) hypertension: Secondary | ICD-10-CM | POA: Diagnosis not present

## 2020-05-27 DIAGNOSIS — E049 Nontoxic goiter, unspecified: Secondary | ICD-10-CM | POA: Diagnosis not present

## 2020-05-27 DIAGNOSIS — M79674 Pain in right toe(s): Secondary | ICD-10-CM | POA: Diagnosis not present

## 2020-05-27 DIAGNOSIS — F411 Generalized anxiety disorder: Secondary | ICD-10-CM | POA: Diagnosis not present

## 2020-05-27 MED ORDER — ALPRAZOLAM 0.25 MG PO TABS: 0.2500 mg | ORAL_TABLET | Freq: Every day | ORAL | 0 refills | Status: DC | PRN

## 2020-05-27 NOTE — Assessment & Plan Note (Signed)
Discussed with patient the cause is unclear at this time, but per up-to-date algorithm for determining cause of thyromegaly, will start with TSH and free T4.  She is not currently having any symptoms.  We will proceed from here to see what needs to be performed.  Already aware that she has diffuse thyromegaly without nodules, oncologist has also discussed with her that cancer is very unlikely.  We will follow up her results and proceed from there.

## 2020-05-27 NOTE — Progress Notes (Signed)
SUBJECTIVE:   CHIEF COMPLAINT / HPI:   Thyromegaly Has been seen by oncologist and had an ultrasound on 05/13/2020 Ultrasound showed an enlarged thyroid with no nodules or cervical adenopathy No recent TSH Denies fatigue, changes in weight, extreme anxiety, difficulty swallowing Thinks that her father had a thyroid operation, but not sure why  Anxiety with Female Exams History of uterine prolapse Has pessary changes every 3 months Has a lot of anxiety around this and takes alprazolam prior to her appointments to be able to tolerate it  Right 5th toe pain Stubbed it 2 weeks ago Still having some pain Not icing it  Has been wearing lose shoes to help with it Has been back at the gym and walking  Hypertension Current regimen: Cozaar 25 mg daily Last BMP 05/08/2020 at [place], calcium slightly decreased, follows with oncology She had ambulatory blood pressure monitoring on 11/26/2019 that revealed blood pressure at goal, no changes needed  Colonoscopy needed, declines COVID booster obtained at [facility], has card with her  PERTINENT  PMH / PSH: HTN, osteoporosis, uterine prolapse, history of estrogen receptor positive left breast cancer  OBJECTIVE:   BP (!) 159/84   Pulse 63   Ht 5\' 3"  (1.6 m)   Wt 117 lb 8 oz (53.3 kg)   LMP 03/21/2002   SpO2 98%   BMI 20.81 kg/m    Physical Exam:  General: 70 y.o. female in NAD Neck: supple, diffuse thyromegaly that is non-tender Cardio: RRR no m/r/g Lungs: CTAB, no wheezing, no rhonchi, no crackles, no IWOB on RA Skin: warm and dry Extremities: No edema, right fifth toe without obvious abnormality, with mild TTP to distal phalanx, ROM intact, no TTP over 5th metatarsal specifically, 2+ DP pulses 5/5 strength right foot in all fields   ASSESSMENT/PLAN:   Goiter Discussed with patient the cause is unclear at this time, but per up-to-date algorithm for determining cause of thyromegaly, will start with TSH and free T4.  She is not  currently having any symptoms.  We will proceed from here to see what needs to be performed.  Already aware that she has diffuse thyromegaly without nodules, oncologist has also discussed with her that cancer is very unlikely.  We will follow up her results and proceed from there.  Anxiety state She has previously done very well with taking 0.25 mg of Xanax prior to pessary appointments.  While her age does increase risk of adverse events with use of the benzodiazepine, she uses so sparingly and appropriately that she is rather low risk for complications.  PMP reviewed, no red flags.  Discussed with Dr. Ardelia Mems who agrees that prescribing is appropriate.  Refill provided for 12 tablets.  Pain of toe of right foot Discussed with patient that it is possible that she has a slight fracture, but given the location and lack of tenderness to palpation over her fifth metatarsal with range of motion intact, nothing would need to be done.  Discussed that pain would likely resolve 6 to 8 weeks after injury.  She is reassured by this and does not request any further imaging.  Essential hypertension Previously noted to have whitecoat hypertension.  She has had ambulatory blood pressure monitoring within the last year that showed values within normal limits at home.  She continues to take Cozaar 25 mg daily.  Continue with her current regimen.  No need for blood work today as she just had some recently at the cancer center.  Cleophas Dunker, [place]

## 2020-05-27 NOTE — Assessment & Plan Note (Addendum)
She has previously done very well with taking 0.25 mg of Xanax prior to pessary appointments.  While her age does increase risk of adverse events with use of the benzodiazepine, she uses so sparingly and appropriately that she is rather low risk for complications.  PMP reviewed, no red flags.  Discussed with Dr. Ardelia Mems who agrees that prescribing is appropriate.  Refill provided for 12 tablets.

## 2020-05-27 NOTE — Assessment & Plan Note (Signed)
Previously noted to have whitecoat hypertension.  She has had ambulatory blood pressure monitoring within the last year that showed values within normal limits at home.  She continues to take Cozaar 25 mg daily.  Continue with her current regimen.  No need for blood work today as she just had some recently at the cancer center.

## 2020-05-27 NOTE — Patient Instructions (Signed)
Thank you for coming to see me today. It was a pleasure. Today we talked about:   We will get some labs today.  If they are abnormal or we need to do something about them, I will call you.  If they are normal, I will send you a message on MyChart (if it is active) or a letter in the mail.  If you don't hear from [name] in 2 weeks, please call the office at the number below.  I have refilled your anxiety medication.  Please follow-up with me in 2-4 weeks for your thyroid.  If you have any questions or concerns, please do not hesitate to call the office at [phone].  Best,   [place] Constable, DO

## 2020-05-27 NOTE — Assessment & Plan Note (Signed)
Discussed with patient that it is possible that she has a slight fracture, but given the location and lack of tenderness to palpation over her fifth metatarsal with range of motion intact, nothing would need to be done.  Discussed that pain would likely resolve 6 to 8 weeks after injury.  She is reassured by this and does not request any further imaging.

## 2020-05-28 ENCOUNTER — Other Ambulatory Visit: Payer: Self-pay | Admitting: Family Medicine

## 2020-05-28 DIAGNOSIS — E049 Nontoxic goiter, unspecified: Secondary | ICD-10-CM

## 2020-05-28 LAB — T4, FREE: Free T4: 1.02 ng/dL (ref 0.82–1.77)

## 2020-05-28 LAB — TSH: TSH: 3.18 u[IU]/mL (ref 0.450–4.500)

## 2020-05-28 NOTE — Progress Notes (Signed)
Nontoxic asymptomatic goiter with normal TFTs.  Per algorithm on up-to-date, proceed with TPO antibodies.  Patient will come in for this.

## 2020-05-29 ENCOUNTER — Other Ambulatory Visit: Payer: Medicare Other

## 2020-05-29 ENCOUNTER — Other Ambulatory Visit: Payer: Self-pay

## 2020-05-29 DIAGNOSIS — E049 Nontoxic goiter, unspecified: Secondary | ICD-10-CM | POA: Diagnosis not present

## 2020-05-30 LAB — THYROID PEROXIDASE ANTIBODY: Thyroperoxidase Ab SerPl-aCnc: 76 IU/mL — ABNORMAL HIGH (ref 0–34)

## 2020-06-01 ENCOUNTER — Other Ambulatory Visit: Payer: Self-pay | Admitting: Family Medicine

## 2020-06-01 ENCOUNTER — Encounter: Payer: Self-pay | Admitting: Family Medicine

## 2020-06-01 DIAGNOSIS — I10 Essential (primary) hypertension: Secondary | ICD-10-CM

## 2020-06-01 DIAGNOSIS — E063 Autoimmune thyroiditis: Secondary | ICD-10-CM | POA: Insufficient documentation

## 2020-06-01 MED ORDER — LOSARTAN POTASSIUM 25 MG PO TABS: 25.0000 mg | ORAL_TABLET | Freq: Every day | ORAL | 3 refills | Status: DC

## 2020-06-24 ENCOUNTER — Other Ambulatory Visit: Payer: Self-pay

## 2020-06-24 ENCOUNTER — Encounter: Payer: Self-pay | Admitting: Obstetrics & Gynecology

## 2020-06-24 ENCOUNTER — Ambulatory Visit (INDEPENDENT_AMBULATORY_CARE_PROVIDER_SITE_OTHER): Payer: Medicare Other | Admitting: Obstetrics & Gynecology

## 2020-06-24 VITALS — BP 120/70

## 2020-06-24 DIAGNOSIS — Z4689 Encounter for fitting and adjustment of other specified devices: Secondary | ICD-10-CM

## 2020-06-24 DIAGNOSIS — N8110 Cystocele, unspecified: Secondary | ICD-10-CM | POA: Diagnosis not present

## 2020-06-24 DIAGNOSIS — N814 Uterovaginal prolapse, unspecified: Secondary | ICD-10-CM | POA: Diagnosis not present

## 2020-06-24 NOTE — Progress Notes (Signed)
    Tammy Levine 21-May-1950 [id number]        70 y.o.  G1P0010   RP: Pessary maintenance  HPI:  Not comfortable with the pessary.  Urinary frequency and discomfort with urination.  No fever.   OB History  Gravida Para Term Preterm AB Living  1       1    SAB IAB Ectopic Multiple Live Births    1          # Outcome Date GA Lbr Len/2nd Weight Sex Delivery Anes PTL Lv  1 IAB             Past medical history,surgical history, problem list, medications, allergies, family history and social history were all reviewed and documented in the EPIC chart.   Directed ROS with pertinent positives and negatives documented in the history of present illness/assessment and plan.  Exam:  Vitals:   06/24/20 1446  BP: 120/70   General appearance:  Normal  Abdomen: Normal  Gynecologic exam: Vulva normal.  Pessary removed easily and cleaned.  Vaginal mucosa intact.  Exam in standing position with valsalva:  Uterine prolapse grade 1-2/3 and Cystocele grade 1-2/4.     Assessment/Plan:  70 y.o. G1P0010   1. Pessary maintenance Decision to try without the pessary.  F/U in 3 weeks if decides to put it back in place or to proceed with Vaginal Hysterectomy/[place] and Anterior Repair.  2. Uterine prolapse As above  3. Baden-Walker grade 2 cystocele As above  Tammy Bruins MD, 3:30 PM 06/24/2020

## 2020-07-05 ENCOUNTER — Encounter: Payer: Self-pay | Admitting: Obstetrics & Gynecology

## 2020-07-17 ENCOUNTER — Ambulatory Visit: Payer: Medicare Other | Admitting: Obstetrics & Gynecology

## 2020-10-15 ENCOUNTER — Ambulatory Visit
Admission: RE | Admit: 2020-10-15 | Discharge: 2020-10-15 | Disposition: A | Discharge status: Home / Self Care | Payer: Medicare Other | Source: Ambulatory Visit | Attending: Hematology | Admitting: Hematology

## 2020-10-15 ENCOUNTER — Other Ambulatory Visit: Payer: Self-pay

## 2020-10-15 DIAGNOSIS — M810 Age-related osteoporosis without current pathological fracture: Secondary | ICD-10-CM | POA: Diagnosis not present

## 2020-10-15 DIAGNOSIS — Z780 Asymptomatic menopausal state: Secondary | ICD-10-CM | POA: Diagnosis not present

## 2020-10-15 DIAGNOSIS — C50212 Malignant neoplasm of upper-inner quadrant of left female breast: Secondary | ICD-10-CM

## 2020-10-15 DIAGNOSIS — E2839 Other primary ovarian failure: Secondary | ICD-10-CM

## 2020-10-15 DIAGNOSIS — R922 Inconclusive mammogram: Secondary | ICD-10-CM | POA: Diagnosis not present

## 2020-10-15 DIAGNOSIS — Z853 Personal history of malignant neoplasm of breast: Secondary | ICD-10-CM | POA: Diagnosis not present

## 2020-10-20 ENCOUNTER — Ambulatory Visit: Payer: Self-pay | Admitting: Obstetrics & Gynecology

## 2020-11-05 ENCOUNTER — Other Ambulatory Visit: Payer: Self-pay

## 2020-11-05 ENCOUNTER — Inpatient Hospital Stay (HOSPITAL_BASED_OUTPATIENT_CLINIC_OR_DEPARTMENT_OTHER): Payer: Medicare Other | Admitting: Hematology

## 2020-11-05 ENCOUNTER — Inpatient Hospital Stay: Payer: Medicare Other | Attending: Hematology

## 2020-11-05 VITALS — BP 165/83 | HR 59 | Temp 96.9°F | Wt 116.9 lb

## 2020-11-05 DIAGNOSIS — C50212 Malignant neoplasm of upper-inner quadrant of left female breast: Secondary | ICD-10-CM | POA: Insufficient documentation

## 2020-11-05 DIAGNOSIS — Z79811 Long term (current) use of aromatase inhibitors: Secondary | ICD-10-CM | POA: Diagnosis not present

## 2020-11-05 DIAGNOSIS — M810 Age-related osteoporosis without current pathological fracture: Secondary | ICD-10-CM | POA: Diagnosis not present

## 2020-11-05 DIAGNOSIS — I10 Essential (primary) hypertension: Secondary | ICD-10-CM | POA: Diagnosis not present

## 2020-11-05 DIAGNOSIS — Z170 Estrogen receptor positive status [ER+]: Secondary | ICD-10-CM

## 2020-11-05 DIAGNOSIS — Z923 Personal history of irradiation: Secondary | ICD-10-CM | POA: Insufficient documentation

## 2020-11-05 DIAGNOSIS — Z79899 Other long term (current) drug therapy: Secondary | ICD-10-CM | POA: Insufficient documentation

## 2020-11-05 LAB — CMP (CANCER CENTER ONLY)
ALT: 17 U/L (ref 0–44)
AST: 21 U/L (ref 15–41)
Albumin: 3.9 g/dL (ref 3.5–5.0)
Alkaline Phosphatase: 51 U/L (ref 38–126)
Anion gap: 8 (ref 5–15)
BUN: 19 mg/dL (ref 8–23)
CO2: 25 mmol/L (ref 22–32)
Calcium: 9 mg/dL (ref 8.9–10.3)
Chloride: 105 mmol/L (ref 98–111)
Creatinine: 0.82 mg/dL (ref 0.44–1.00)
GFR, Estimated: >60 mL/min (ref >60)
Glucose, Bld: 102 mg/dL — ABNORMAL HIGH (ref 70–99)
Potassium: 4.1 mmol/L (ref 3.5–5.1)
Sodium: 138 mmol/L (ref 135–145)
Total Bilirubin: 0.4 mg/dL (ref 0.3–1.2)
Total Protein: 7 g/dL (ref 6.5–8.1)

## 2020-11-05 LAB — CBC WITH DIFFERENTIAL (CANCER CENTER ONLY)
Abs Immature Granulocytes: 0.01 10*3/uL (ref 0.00–0.07)
Basophils Absolute: 0 10*3/uL (ref 0.0–0.1)
Basophils Relative: 1 %
Eosinophils Absolute: 0.2 10*3/uL (ref 0.0–0.5)
Eosinophils Relative: 3 %
HCT: 38.3 % (ref 36.0–46.0)
Hemoglobin: 12.3 g/dL (ref 12.0–15.0)
Immature Granulocytes: 0 %
Lymphocytes Relative: 28 %
Lymphs Abs: 1.5 10*3/uL (ref 0.7–4.0)
MCH: 27.7 pg (ref 26.0–34.0)
MCHC: 32.1 g/dL (ref 30.0–36.0)
MCV: 86.3 fL (ref 80.0–100.0)
Monocytes Absolute: 0.5 10*3/uL (ref 0.1–1.0)
Monocytes Relative: 8 %
Neutro Abs: 3.2 10*3/uL (ref 1.7–7.7)
Neutrophils Relative %: 60 %
Platelet Count: 260 10*3/uL (ref 150–400)
RBC: 4.44 MIL/uL (ref 3.87–5.11)
RDW: 13.2 % (ref 11.5–15.5)
WBC Count: 5.4 10*3/uL (ref 4.0–10.5)
nRBC: 0 % (ref 0.0–0.2)

## 2020-11-05 MED ORDER — ANASTROZOLE 1 MG PO TABS: 1.0000 mg | ORAL_TABLET | Freq: Every day | ORAL | 3 refills | Status: DC

## 2020-11-05 NOTE — Progress Notes (Signed)
Tammy Levine   Telephone:([phone] Fax:([phone]) [phone]   Clinic Follow up Note   Patient Care Team: Alcus Dad, MD as PCP - General (Family Medicine) Jovita Kussmaul, MD as Consulting Physician (General Surgery) Truitt Merle, MD as Consulting Physician (Hematology) Gery Pray, MD as Consulting Physician (Radiation Oncology) Alla Feeling, NP as Nurse Practitioner (Nurse Practitioner)  Date of Service:  11/05/2020  CHIEF COMPLAINT: f/u of left breast cancer  SUMMARY OF ONCOLOGIC HISTORY: Oncology History Overview Note  Cancer Staging Malignant neoplasm of upper-inner quadrant of left breast in female, estrogen receptor positive ([place]) Staging form: Breast, AJCC 8th Edition - Clinical stage from 10/12/2017: Stage IB (cT2, cN0, cM0, G2, ER+, PR+, HER2-) - Signed by Truitt Merle, MD on 10/18/2017 - Pathologic stage from 11/01/2017: Stage IA (pT2, pN0, cM0, G3, ER+, PR+, HER2-, Oncotype DX score: 2) - Signed by Truitt Merle, MD on 01/03/2018     Malignant neoplasm of upper-inner quadrant of left breast in female, estrogen receptor positive ([place])  10/09/2017 Mammogram   Diagnostic Mammogram 10/09/17 IMPRESSION: 3.2 x 3.0 x 2.0 cm mass with imaging features highly suspicious for malignancy in the 11 o'clock position of the left breast.   10/11/2017 Initial Biopsy   Diagnosis 10/11/17 Breast, left, needle core biopsy, upper inner 11 o'clock - INVASIVE DUCTAL CARCINOMA WITH PAPILLARY FEATURES, SEE COMMENT.    10/11/2017 Receptors her2   Estrogen Receptor: 100%, POSITIVE, STRONG STAINING INTENSITY Progesterone Receptor: 80%, POSITIVE, STRONG STAINING INTENSITY Proliferation Marker Ki67: 15% HER2 - NEGATIVE   10/12/2017 Cancer Staging   Staging form: Breast, AJCC 8th Edition - Clinical stage from 10/12/2017: Stage IB (cT2, cN0, cM0, G2, ER+, PR+, HER2-) - Signed by Truitt Merle, MD on 10/18/2017   10/17/2017 Initial Diagnosis   Malignant neoplasm of upper-inner quadrant of  left breast in female, estrogen receptor positive ([place])   11/01/2017 Surgery    She had left breast lumpectomy with Dr. Marlou Starks on 11/01/2017   11/01/2017 Pathology Results   11/01/2017 Surgical Pathology Diagnosis 1. Lymph node, sentinel, biopsy, Left - NO CARCINOMA IDENTIFIED IN ONE LYMPH NODE (0/1) 2. Lymph node, sentinel, biopsy, Left - NO CARCINOMA IDENTIFIED IN ONE LYMPH NODE (0/1) 3. Lymph node, sentinel, biopsy, Left - NO CARCINOMA IDENTIFIED IN ONE LYMPH NODE (0/1) 4. Breast, lumpectomy, Left - INVASIVE MAMMARY CARCINOMA, WITH PAPILLARY AND MICROPAPILLARY FEATURES, NOTTINGHAM GRADE 3 OF 3, 3.1 CM - MARGINS UNINVOLVED BY CARCINOMA (0.2 CM; POSTERIOR MARGIN) - SEE ONCOLOGY TABLE AND COMMENT BELOW   11/01/2017 Oncotype testing   RS Score 2 Distant recurrence risk at 9 years 3% Group average absolute chemo benefit <1%   11/01/2017 Cancer Staging   Staging form: Breast, AJCC 8th Edition - Pathologic stage from 11/01/2017: Stage IA (pT2, pN0, cM0, G3, ER+, PR+, HER2-, Oncotype DX score: 2) - Signed by Truitt Merle, MD on 01/03/2018   12/12/2017 - 01/05/2018 Radiation Therapy   Daily radiation therapy with Dr. Sondra Come Radiation treatment dates:   12/11/17 - 01/05/18   Site/dose:  Total of 50.05 Gy in 20 fractions 1. Breast, Left/ 40.05 Gy delivered in 15 fractions of 2.67 Gy 2. Boost/ 10 Gy delivered in 5 fractions of 2 Gy   Beams/energy:    1. 3D, Photon/ 6X 2. Isodose Plan/ 6X   Narrative: The patient tolerated radiation treatment relatively well. She reported cording to the axilla that resolved over several days. By the end of treatments, she reported mild fatigue and slight sensitivity to the breast. Nurse note  reported nipple was red and sensitive, and skin slightly hyperpigmented with rash appearance.     03/2018 -  Anti-estrogen oral therapy   Tamoxifen 98m daily starting 03/2018. D/c on 08/08/18 due to vaginal discharge. Start Anastrozole in 08/2018.    07/18/2018 Survivorship    SCP per LCira Rue NP    11/08/2018 - 05/08/2020 Antibody Plan   Zometa for osteoporosis every 6 months for 2 years, 11/08/18-05/08/20      CURRENT THERAPY:  -Tamoxifen 242mdaily starting 03/2018. D/c on 08/08/18 due to vaginal discharge. Start Anastrozole in 08/2018.   INTERVAL HISTORY:  Tammy Bergdolls here for a follow up of breast cancer. She was last seen by me on 05/08/20. She presents to the clinic alone.   All other systems were reviewed with the patient and are negative.  MEDICAL HISTORY:  Past Medical History:  Diagnosis Date   [place]   Hypertension    Personal history of radiation therapy     SURGICAL HISTORY: Past Surgical History:  Procedure Laterality Date   BLEPHAROPLASTY     lower eyelid   BREAST LUMPECTOMY     BREAST LUMPECTOMY WITH AXILLARY LYMPH NODE BIOPSY Left 11/01/2017   Procedure: BREAST LUMPECTOMY WITH SENTINEL LYMPH NODE MAPPING;  Surgeon: ToJovita KussmaulMD;  [facility] Service: General;  Laterality: Left;    I have reviewed the social history and family history with the patient and they are unchanged from previous note.  ALLERGIES:  has No Known Allergies.  MEDICATIONS:  Current Outpatient Medications  Medication Sig Dispense Refill   ALPRAZolam (XANAX) 0.25 MG tablet Take 1 tablet (0.25 mg total) by mouth daily as needed for anxiety. 12 tablet 0   anastrozole (ARIMIDEX) 1 MG tablet Take 1 tablet (1 mg total) by mouth daily. 90 tablet 3   Calcium Carbonate-Vit D-Min (CALCIUM 1200) 1200-1000 MG-UNIT CHEW Chew by mouth.     losartan (COZAAR) 25 MG tablet Take 1 tablet (25 mg total) by mouth daily. 90 tablet 3   naproxen sodium (ALEVE) 220 MG tablet Take 220 mg by mouth daily as needed.     tretinoin (RETIN-A) 0.1 % cream Apply topically at bedtime. 45 g 2   vitamin C (ASCORBIC ACID) 500 MG tablet Take 500 mg by mouth daily.     No current facility-administered medications for this visit.     PHYSICAL EXAMINATION: ECOG PERFORMANCE STATUS: 0 - Asymptomatic  Vitals:   11/05/20 1438  BP: (!) 165/83  Pulse: (!) 59  Temp: (!) 96.9 F (36.1 C)  SpO2: 99%   Wt Readings from Last 3 Encounters:  11/05/20 116 lb 14.4 oz (53 kg)  05/27/20 117 lb 8 oz (53.3 kg)  05/08/20 117 lb (53.1 kg)     GENERAL:alert, no distress and comfortable SKIN: skin color normal, no rashes or significant lesions EYES: normal, Conjunctiva are pink and non-injected, sclera clear  NEURO: alert & oriented x 3 with fluent speech Pt declined breast exam today   LABORATORY DATA:  I have reviewed the data as listed CBC Latest Ref Rng & Units 11/05/2020 05/08/2020 11/07/2019  WBC 4.0 - 10.5 K/uL 5.4 5.1 5.7  Hemoglobin 12.0 - 15.0 g/dL 12.3 12.6 13.0  Hematocrit 36.0 - 46.0 % 38.3 40.0 40.6  Platelets 150 - 400 K/uL 260 225 238     CMP Latest Ref Rng & Units 11/05/2020 05/08/2020 11/07/2019  Glucose 70 - 99 mg/dL 102(H) 95 95  BUN 8 -  23 mg/dL 19 26(H) 21  Creatinine 0.44 - 1.00 mg/dL 0.82 0.77 0.74  Sodium 135 - 145 mmol/L 138 138 139  Potassium 3.5 - 5.1 mmol/L 4.1 3.6 3.9  Chloride 98 - 111 mmol/L 105 107 107  CO2 22 - 32 mmol/L _0 Calcium 8.9 - 10.3 mg/dL 9.0 8.7(L) 9.7  Total Protein 6.5 - 8.1 g/dL 7.0 7.2 7.4  Total Bilirubin 0.3 - 1.2 mg/dL 0.4 0.4 0.4  Alkaline Phos 38 - 126 U/L 51 50 54  AST 15 - 41 U/L _1 ALT 0 - 44 U/L _2 RADIOGRAPHIC STUDIES: I have personally reviewed the radiological images as listed and agreed with the findings in the report. No results found.   ASSESSMENT & PLAN:  Katerra Ingman is a 70 y.o. female with   1. Malignant neoplasm of upper-inner quadrant of left breast, Stage IA, pT2N0M0, ER/PR: (+), HER2 (-), Grade III, RS 2 -She was diagnosed in 09/2017. She is s/p left breast lumpectomy and adjuvant radiation. With Oncotype RS 2, I did not recommend adjuvant chemotherapy.  -She started anti-estrogen therapy with Tamoxifen in 03/2018,  tolerated well initially but then developed vaginal discharge and slight of uterine or bladder prolapse. I switched her to Anastrozole in 08/2018. She is tolerating well so far without issue.  -her most recent mammogram from 10/15/20 was negative. -She is clinically doing well. Lab reviewed, her CBC and CMP are within normal limits. There is no clinical concern for recurrence. -Continue Surveillance.  -Continue anastrozole -F/u in 6 months. I encouraged her to continue her adequate exercise and balanced diet.     2. Osteoporosis  -Her 03/2018 DEXA shows osteoporosis with lowest T-score of -3.6 at [facility]. Repeat on 10/15/20 showed slight improvement to -3.3, slightly improved  -She is now on anastrozole, which she is aware can weaken her bones. -she received Zometa infusion every 6 months for 2 years, 11/08/18-05/08/20. Tolerated well.  -She will continue calcium and Vitamin D.  -Patient had many questions about the role of more biphosphonate for her osteoporosis.  I will hold on this for now, but may consider if her next bone density scan in 2 years showed worsening to process   3. Hypertension   -BP is elevated in clinic. She continues Losartan. Continue to f/u with PCP.     4. Uterine or bladder prolapse -She has been seen by Gyn Dr. Phineas Real and per his note she plans to have Hysterectomy once COVID19 improves.    5. Nodule in right anterior neck  -On exam at 05/08/20 visit, she was found to have a 2cm nodule in right mid anterior neck. -She underwent thyroid US on 05/13/20 showing: markedly heterogeneous enlarged thyroid compatible with chronic medical thyroid disease. -she will f/u with PCP, thyroid function normal      PLAN: -Continue anastrozole -Lab and follow-up with APP in 6 months   No problem-specific Assessment & Plan notes found for this encounter.   No orders of the defined types were placed in this encounter.  All questions were answered. The patient knows to call the  clinic with any problems, questions or concerns. No barriers to learning was detected. The total time spent in the appointment was 30 minutes.     Truitt Merle, MD 11/05/2020   I, Wilburn Mylar, am acting as scribe for Truitt Merle, MD.   I have reviewed the above documentation for accuracy and completeness, and I  agree with the above.

## 2020-12-15 ENCOUNTER — Other Ambulatory Visit: Payer: Self-pay

## 2020-12-15 ENCOUNTER — Encounter: Payer: Self-pay | Admitting: Family Medicine

## 2020-12-15 ENCOUNTER — Ambulatory Visit (INDEPENDENT_AMBULATORY_CARE_PROVIDER_SITE_OTHER): Payer: Medicare Other | Admitting: Family Medicine

## 2020-12-15 VITALS — BP 148/82 | Wt 116.0 lb

## 2020-12-15 DIAGNOSIS — E063 Autoimmune thyroiditis: Secondary | ICD-10-CM | POA: Diagnosis not present

## 2020-12-15 DIAGNOSIS — Z23 Encounter for immunization: Secondary | ICD-10-CM | POA: Diagnosis not present

## 2020-12-15 DIAGNOSIS — E785 Hyperlipidemia, unspecified: Secondary | ICD-10-CM | POA: Diagnosis not present

## 2020-12-15 DIAGNOSIS — F411 Generalized anxiety disorder: Secondary | ICD-10-CM

## 2020-12-15 DIAGNOSIS — E049 Nontoxic goiter, unspecified: Secondary | ICD-10-CM | POA: Diagnosis not present

## 2020-12-15 MED ORDER — ALPRAZOLAM 0.25 MG PO TABS: 0.2500 mg | ORAL_TABLET | Freq: Every day | ORAL | 0 refills | Status: DC | PRN

## 2020-12-15 NOTE — Progress Notes (Signed)
SUBJECTIVE:   CHIEF COMPLAINT / HPI:   Discuss Bone Density Scan Results Patient recently had bone density scan ordered by her oncologist due to hx of osteoporosis and Anastrazole therapy. She is wondering if the scan showed any arthritis. Patient denies joint pain or specific concerns related to arthritis. She exercises regularly. Received Zometa infusion every 6 months up until Feb 2022 for her osteoporosis.  Goiter Patient wondering about her thyroid function. Has been told her thyroid was abnormal but she wasn't sure if it's "low" or "high".  Per chart review, she has had goiter noted on exam. Ultrasound in Feb 2022 showed diffuse enlargement c/w medical disease but no nodules or discrete masses. TSH and T4 were normal at that time, but thyroperoxidase antibody positive.  Med Refill Patient requesting refill on her Alprazolam. States she only uses it as needed (takes 0.25mg , only receives 12 tablets every few months). Reports it's very helpful at gynecologist as she has significant anxiety with pelvic exams.  Question re: cardiac blockages Patient with tingling in the tips of bilateral fingers every so often. Self-resolves after a few minutes. Patient concerned this may be a sign of heart blockages. Would like her cholesterol re-checked today. Denies chest pain, dyspnea, leg swelling, palpitations, or syncope. Exercises on a regular basis without difficulty. No tobacco use. No significant fam hx of cardiac disease- Dad had 1 stent later in life, Mom and Dad lived into their 34s/90s.   PERTINENT  PMH / PSH: breast cancer, osteoporosis, Hashimoto's, anxiety  OBJECTIVE:   BP (!) 148/82   Wt 116 lb (52.6 kg)   LMP 03/21/2002   BMI 20.55 kg/m   General: NAD, pleasant, able to participate in exam Thyroid: diffusely enlarged, nontender, no nodules palpated Cardiac: RRR, S1 S2 present. normal heart sounds, no murmurs. Respiratory: CTAB, normal effort, No wheezes, rales or  rhonchi Extremities: no edema or cyanosis. Skin: warm and dry, no rashes noted Neuro: alert, no obvious focal deficits Psych: Normal affect and mood   ASSESSMENT/PLAN:   Hashimoto's thyroiditis Presumed diagnosis in Feb 2022 based on elevated TPO Abs with normal TSH and free T4. Thyroid ultrasound showed diffuse enlargement at that time. Asymptomatic. -Recheck TSH and Free T4 today. If normal, will monitor yearly or every other year going forward.  Anxiety state Refills provided for Alprazolam 0.25mg  daily as needed. PDMP reviewed. No red flags. Uses this primarily for GYN appointments. Typically receives 12 tablets every few months (last refill March 2022).  Osteoporosis Discussed with patient that bone density scan is used to evaluate for osteoporosis, not osteoarthritis, and explained differences. Her bone density scan did exclude L2-L3 due to degenerative changes. Patient was informed of presence of arthritis in lumbar spine, but that this does not characterize degree of arthritis. Additionally, patient is asymptomatic, so no additional workup/treatment indicated at this time.  HLD Last lipid panel in 2019 showed total cholesterol mildly elevated at 207 and LDL of 124. Remainder wnl (HDL 68, triglycerides 75). Did not meet criteria for medication therapy at that time. -Repeat lipid panel today -No additional cardiac workup indicated (only cardiac risk factor is HTN). Patient's rare finger tingling unlikely to be cardiac related. Possibly secondary to cervical radiculopathy vs hx of radiation treatment for breast cancer. Reassurance provided. Return if worsening.  Elevated BP Patient's BP elevated to 148/82 today. Compliant with her home Losartan 25mg  daily. Has known hx of white coat HTN. Previously had ambulatory blood pressure monitoring which showed BP within normal limits at  home. No medication changes or additional workup required today.   Alcus Dad, MD [place]

## 2020-12-15 NOTE — Patient Instructions (Addendum)
It was great to meet you!  -We will check your cholesterol and thyroid levels today. I will send you a MyChart message with the results or call if they are abnormal.   Take care and seek immediate care sooner if you develop any concerns.  Dr. [facility]

## 2020-12-16 LAB — LIPID PANEL
Chol/HDL Ratio: 2.5 ratio (ref 0.0–4.4)
Cholesterol, Total: 195 mg/dL (ref 100–199)
HDL: 79 mg/dL (ref >39)
LDL Chol Calc (NIH): 102 mg/dL — ABNORMAL HIGH (ref 0–99)
Triglycerides: 76 mg/dL (ref 0–149)
VLDL Cholesterol Cal: 14 mg/dL (ref 5–40)

## 2020-12-16 LAB — TSH+FREE T4
Free T4: 1.03 ng/dL (ref 0.82–1.77)
TSH: 4.38 u[IU]/mL (ref 0.450–4.500)

## 2020-12-16 NOTE — Assessment & Plan Note (Signed)
Refills provided for Alprazolam 0.25mg  daily as needed. PDMP reviewed. No red flags. Uses this primarily for GYN appointments. Typically receives 12 tablets every few months (last refill March 2022).

## 2020-12-16 NOTE — Assessment & Plan Note (Addendum)
Presumed diagnosis in Feb 2022 based on elevated TPO Abs with normal TSH and free T4. Thyroid ultrasound showed diffuse enlargement at that time. Asymptomatic. -Recheck TSH and Free T4 today. If normal, will monitor yearly or every other year going forward.

## 2021-01-05 DIAGNOSIS — Z23 Encounter for immunization: Secondary | ICD-10-CM | POA: Diagnosis not present

## 2021-03-31 IMAGING — MG DIGITAL DIAGNOSTIC BILATERAL MAMMOGRAM WITH TOMO AND CAD
9 series · 9 of 25 positions shown · non-contrast
Comparison: Previous exam(s).

CLINICAL DATA: Status post left lumpectomy and radiation therapy
breast cancer in 7285.

EXAM:
DIGITAL DIAGNOSTIC BILATERAL MAMMOGRAM WITH CAD AND TOMO

[L MLO]
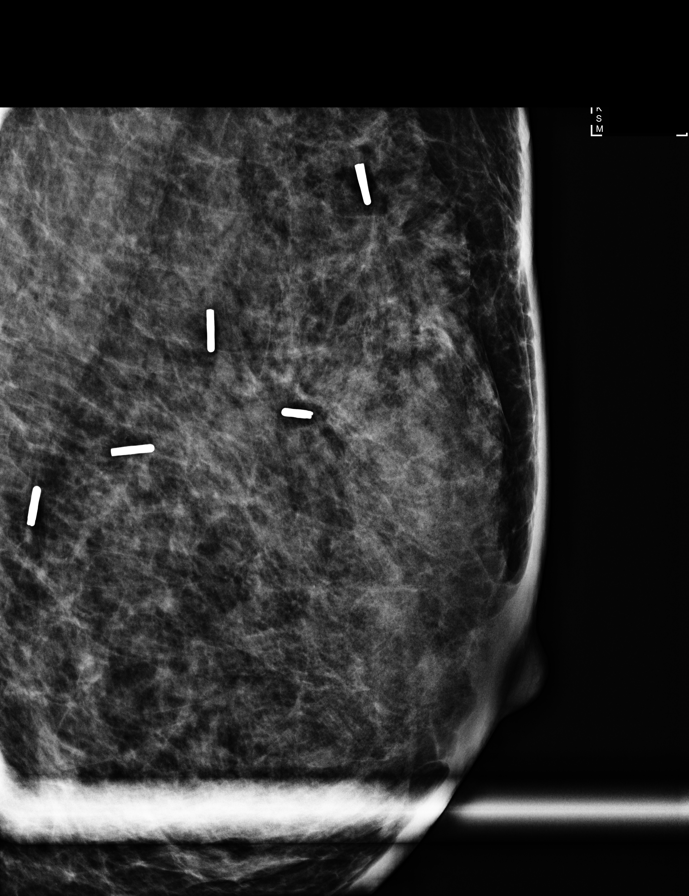

[L MLO synth-2D]
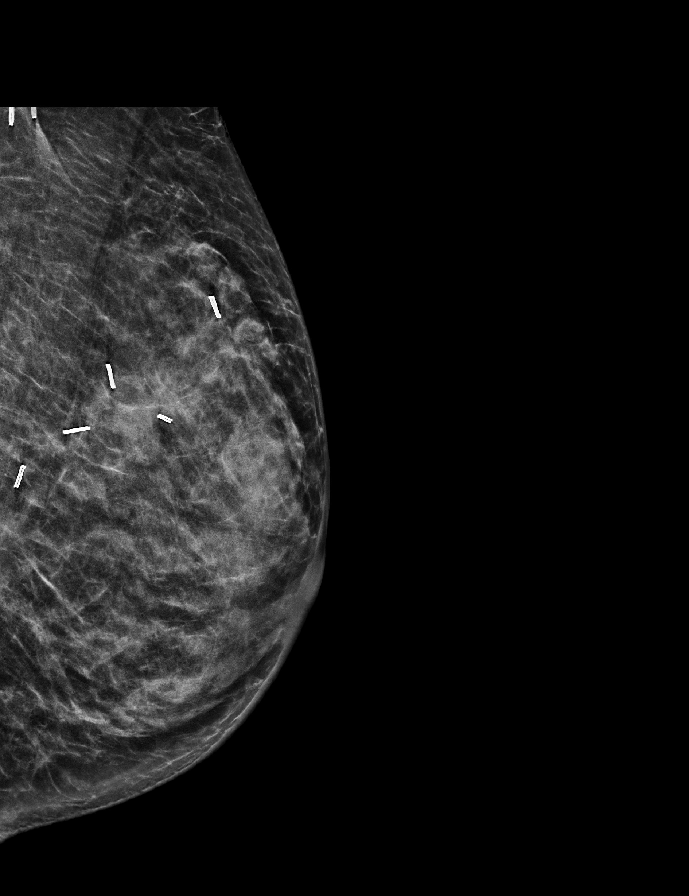

[R MLO synth-2D]
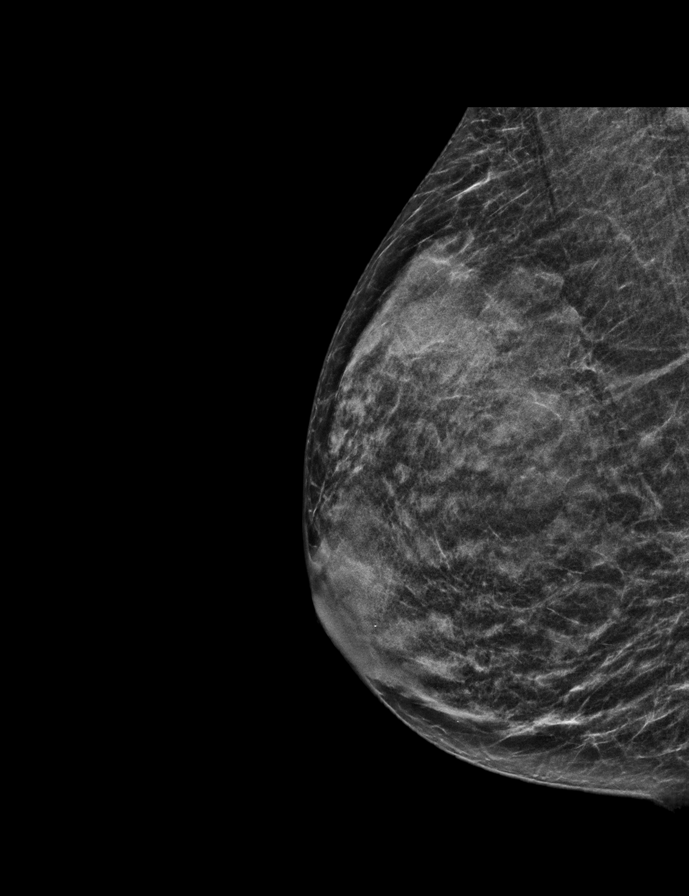

[L CC synth-2D]
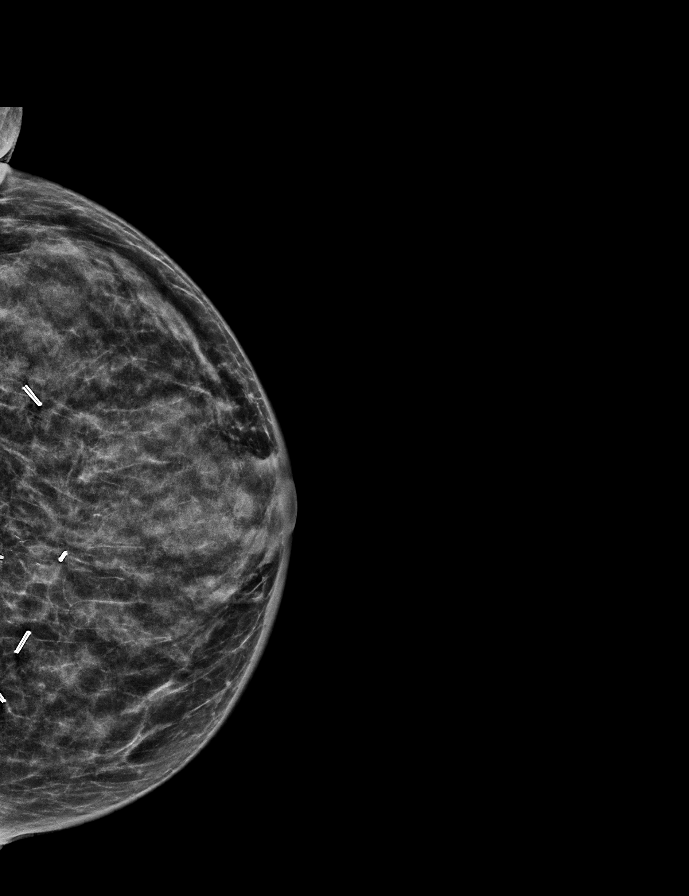

[R CC synth-2D]
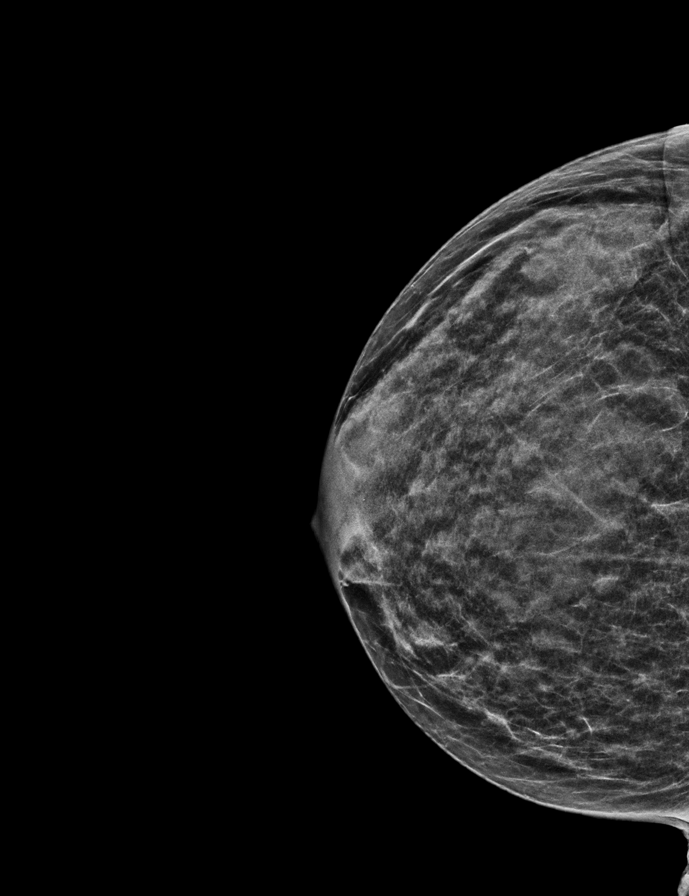

[L CC tomo · tomo slice 27/52.0]
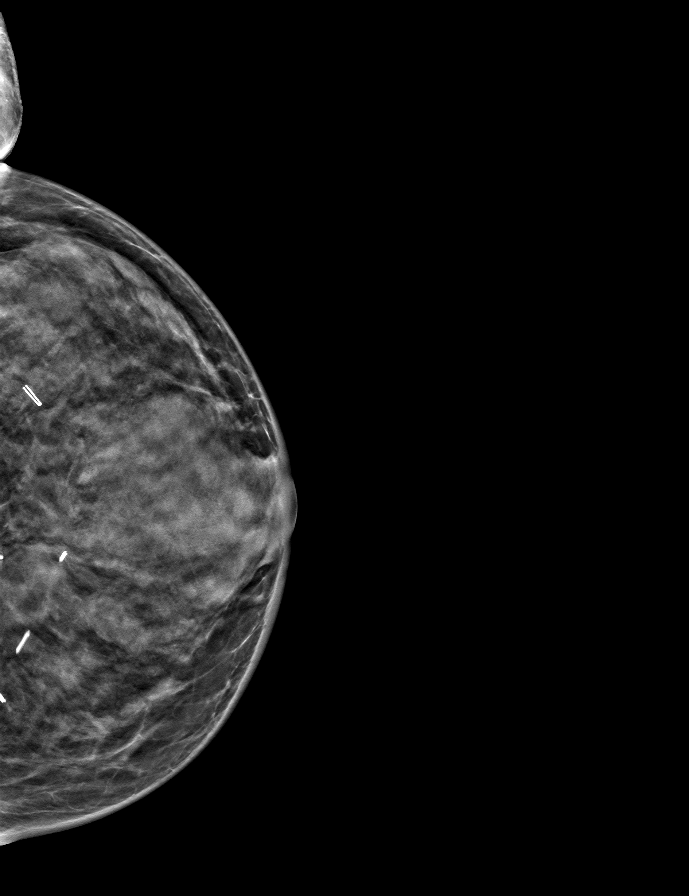

[L MLO tomo · tomo slice 29/56.0]
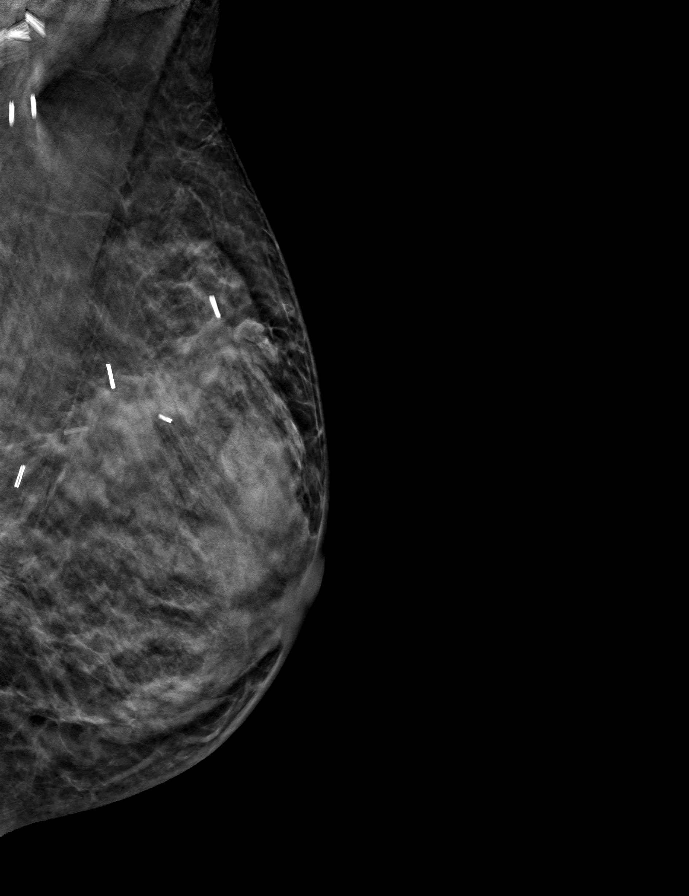

[R MLO tomo · tomo slice 21/42.0]
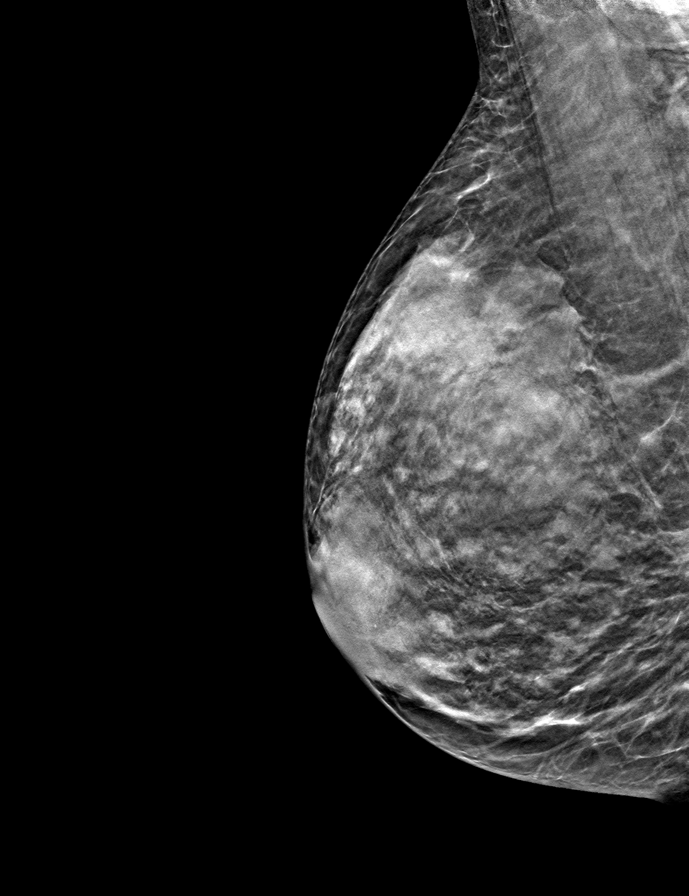

[R CC tomo · tomo slice 21/42.0]
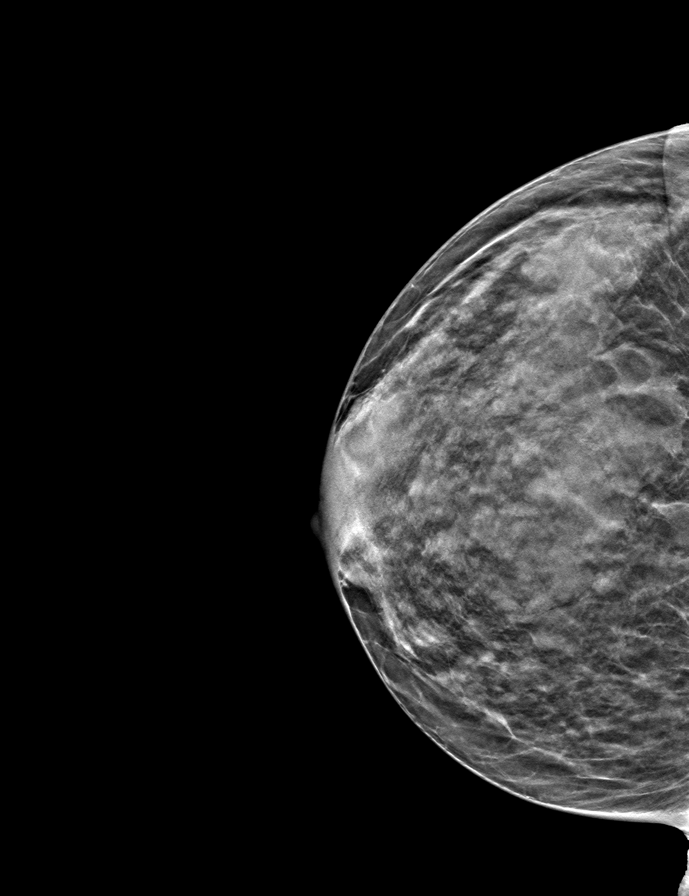

[9 of 25 positions shown; findings below may reference images not displayed]

ACR Breast Density Category c: The breast tissue is heterogeneously
dense, which may obscure small masses.
FINDINGS: Interval post lumpectomy changes on the left with surgical absence
of the previously demonstrated mass. No interval findings suspicious
for malignancy in either breast.

Mammographic images were processed with CAD.
IMPRESSION: No evidence of malignancy.

RECOMMENDATION:
Bilateral diagnostic mammogram in 1 year.

I have discussed the findings and recommendations with the patient.
Results were also provided in writing at the conclusion of the
visit. If applicable, a reminder letter will be sent to the patient
regarding the next appointment.

BI-RADS CATEGORY  2: Benign.

## 2021-04-02 ENCOUNTER — Encounter: Payer: Self-pay | Admitting: Family Medicine

## 2021-04-02 ENCOUNTER — Other Ambulatory Visit: Payer: Self-pay

## 2021-04-02 ENCOUNTER — Ambulatory Visit (INDEPENDENT_AMBULATORY_CARE_PROVIDER_SITE_OTHER): Payer: Medicare Other | Admitting: Family Medicine

## 2021-04-02 VITALS — BP 126/82 | HR 63 | Ht 63.0 in | Wt 114.4 lb

## 2021-04-02 DIAGNOSIS — R350 Frequency of micturition: Secondary | ICD-10-CM | POA: Diagnosis not present

## 2021-04-02 DIAGNOSIS — E785 Hyperlipidemia, unspecified: Secondary | ICD-10-CM

## 2021-04-02 LAB — POCT URINALYSIS DIP (MANUAL ENTRY)
Bilirubin, UA: NEGATIVE
Glucose, UA: NEGATIVE mg/dL
Ketones, POC UA: NEGATIVE mg/dL
Nitrite, UA: NEGATIVE
Protein Ur, POC: NEGATIVE mg/dL
Spec Grav, UA: >=1.03 — AB (ref 1.010–1.025)
Urobilinogen, UA: 0.2 E.U./dL
pH, UA: 5.5 (ref 5.0–8.0)

## 2021-04-02 LAB — POCT UA - MICROSCOPIC ONLY

## 2021-04-02 MED ORDER — ATORVASTATIN CALCIUM 10 MG PO TABS: 10.0000 mg | ORAL_TABLET | Freq: Every day | ORAL | 0 refills | Status: DC

## 2021-04-02 NOTE — Patient Instructions (Addendum)
It was great to see you!  Things we discussed at today's visit: - Due to your age and history of high blood pressure, you technically meet criteria to take a statin for prevention of heart disease. We will start a medication called Atorvastatin (Lipitor) once daily.  - With regard to your urinary issues, we are checking your urine today for signs of infection or other abnormalities.  - I'd like you to keep a log of each time you're using the bathroom and a log of your fluid intake. - We can consider a referral to urology or urogyn in the future if necessary  Take care and seek immediate care sooner if you develop any concerns.  Dr. [facility]

## 2021-04-02 NOTE — Progress Notes (Signed)
° ° °  SUBJECTIVE:   CHIEF COMPLAINT / HPI:   Discuss Cholesterol/Statin Patient seen for annual physical September 2022. At that time, lipid panel showed total cholesterol 195, triglycerides 76, HDL 79, LDL 102.  ASCVD risk calculated to be 13.6% at that time, so patient was advised to schedule appt to discuss statin therapy. She is interested in starting a statin if it's indicated and is a big proponent of preventative care.  Frequent Urination Patient gets up 4x/night to urinate. This has been ongoing for a few months and is very bothersome to her. Reports she does not have any issues during the daytime. No significant urge, no dysuria, no incontinence.  Patient has a history of "slight uterine prolapse" and wonders whether this is contributing. Notes she does kegels regularly. She does endorse large fluid intake but stops drinking fluids after 6pm.   PERTINENT  PMH / PSH: HTN, hashimoto's thyroiditis, breast cancer  OBJECTIVE:   BP 126/82    Pulse 63    Ht 5\' 3"  (1.6 m)    Wt 114 lb 6.4 oz (51.9 kg)    LMP 03/21/2002    SpO2 99%    BMI 20.27 kg/m   General: NAD, pleasant, able to participate in exam Respiratory: No respiratory distress Skin: warm and dry, no rashes noted Psych: Normal affect and mood Neuro: grossly intact  The 10-year ASCVD risk score (Arnett DK, et al., 2019) is: 11.3%   Values used to calculate the score:     Age: 70 years     Sex: Female     Is Non-Hispanic African American: No     Diabetic: No     Tobacco smoker: No     Systolic Blood Pressure: 119 mmHg     Is BP treated: Yes     HDL Cholesterol: 79 mg/dL     Total Cholesterol: 195 mg/dL   ASSESSMENT/PLAN:   Hyperlipidemia 10-year ASCVD risk calculated to be 11.3% today (see objective above). Discussed risks/benefits of statin therapy. Given risk of 11.3% plus presence of at least 1 CVD risk factor (HTN in her case) opted to start atorvastatin 10mg  daily.   Frequent urination Chronic over the last  few months. Interestingly, is only isolated to night time hours. Will obtain UA and urine culture to r/o infection or other abnormality. Consider diabetes although very low suspicion (normal BMI, no hyperglycemia noted in past, would expect daytime symptoms). May be related to her uterine prolapse vs pelvic floor weakening. Also included in the differential is interstitial cystitis and overactive bladder, although again would expect daytime symptoms. Patient to keep written log of fluid intake and episodes of urination. We will consider urology referral pending fluid/urination log.     Alcus Dad, MD [place]

## 2021-04-04 DIAGNOSIS — R350 Frequency of micturition: Secondary | ICD-10-CM | POA: Insufficient documentation

## 2021-04-04 DIAGNOSIS — E785 Hyperlipidemia, unspecified: Secondary | ICD-10-CM | POA: Insufficient documentation

## 2021-04-04 LAB — URINE CULTURE

## 2021-04-04 NOTE — Assessment & Plan Note (Signed)
Chronic over the last few months. Interestingly, is only isolated to night time hours. Will obtain UA and urine culture to r/o infection or other abnormality. Consider diabetes although very low suspicion (normal BMI, no hyperglycemia noted in past, would expect daytime symptoms). May be related to her uterine prolapse vs pelvic floor weakening. Also included in the differential is interstitial cystitis and overactive bladder, although again would expect daytime symptoms. Patient to keep written log of fluid intake and episodes of urination. We will consider urology referral pending fluid/urination log.

## 2021-04-04 NOTE — Assessment & Plan Note (Signed)
10-year ASCVD risk calculated to be 11.3% today (see objective above). Discussed risks/benefits of statin therapy. Given risk of 11.3% plus presence of at least 1 CVD risk factor (HTN in her case) opted to start atorvastatin 10mg  daily.

## 2021-05-06 ENCOUNTER — Encounter: Payer: Self-pay | Admitting: Nurse Practitioner

## 2021-05-06 ENCOUNTER — Other Ambulatory Visit: Payer: Self-pay

## 2021-05-06 ENCOUNTER — Inpatient Hospital Stay: Payer: Medicare Other | Attending: Nurse Practitioner

## 2021-05-06 ENCOUNTER — Inpatient Hospital Stay (HOSPITAL_BASED_OUTPATIENT_CLINIC_OR_DEPARTMENT_OTHER): Payer: Medicare Other | Admitting: Nurse Practitioner

## 2021-05-06 VITALS — BP 156/88 | HR 63 | Temp 97.6°F | Resp 16 | Ht 63.0 in | Wt 113.6 lb

## 2021-05-06 DIAGNOSIS — N8110 Cystocele, unspecified: Secondary | ICD-10-CM | POA: Diagnosis not present

## 2021-05-06 DIAGNOSIS — Z79899 Other long term (current) drug therapy: Secondary | ICD-10-CM | POA: Diagnosis not present

## 2021-05-06 DIAGNOSIS — M810 Age-related osteoporosis without current pathological fracture: Secondary | ICD-10-CM | POA: Diagnosis not present

## 2021-05-06 DIAGNOSIS — Z170 Estrogen receptor positive status [ER+]: Secondary | ICD-10-CM | POA: Insufficient documentation

## 2021-05-06 DIAGNOSIS — I10 Essential (primary) hypertension: Secondary | ICD-10-CM | POA: Insufficient documentation

## 2021-05-06 DIAGNOSIS — C50212 Malignant neoplasm of upper-inner quadrant of left female breast: Secondary | ICD-10-CM

## 2021-05-06 LAB — CMP (CANCER CENTER ONLY)
ALT: 15 U/L (ref 0–44)
AST: 22 U/L (ref 15–41)
Albumin: 4.2 g/dL (ref 3.5–5.0)
Alkaline Phosphatase: 48 U/L (ref 38–126)
Anion gap: 4 — ABNORMAL LOW (ref 5–15)
BUN: 23 mg/dL (ref 8–23)
CO2: 29 mmol/L (ref 22–32)
Calcium: 9.1 mg/dL (ref 8.9–10.3)
Chloride: 107 mmol/L (ref 98–111)
Creatinine: 0.77 mg/dL (ref 0.44–1.00)
GFR, Estimated: >60 mL/min (ref >60)
Glucose, Bld: 98 mg/dL (ref 70–99)
Potassium: 4 mmol/L (ref 3.5–5.1)
Sodium: 140 mmol/L (ref 135–145)
Total Bilirubin: 0.4 mg/dL (ref 0.3–1.2)
Total Protein: 7 g/dL (ref 6.5–8.1)

## 2021-05-06 LAB — CBC WITH DIFFERENTIAL (CANCER CENTER ONLY)
Abs Immature Granulocytes: 0.01 10*3/uL (ref 0.00–0.07)
Basophils Absolute: 0 10*3/uL (ref 0.0–0.1)
Basophils Relative: 1 %
Eosinophils Absolute: 0.1 10*3/uL (ref 0.0–0.5)
Eosinophils Relative: 3 %
HCT: 38.5 % (ref 36.0–46.0)
Hemoglobin: 12 g/dL (ref 12.0–15.0)
Immature Granulocytes: 0 %
Lymphocytes Relative: 28 %
Lymphs Abs: 1.2 10*3/uL (ref 0.7–4.0)
MCH: 26.8 pg (ref 26.0–34.0)
MCHC: 31.2 g/dL (ref 30.0–36.0)
MCV: 86.1 fL (ref 80.0–100.0)
Monocytes Absolute: 0.4 10*3/uL (ref 0.1–1.0)
Monocytes Relative: 9 %
Neutro Abs: 2.6 10*3/uL (ref 1.7–7.7)
Neutrophils Relative %: 59 %
Platelet Count: 245 10*3/uL (ref 150–400)
RBC: 4.47 MIL/uL (ref 3.87–5.11)
RDW: 13.4 % (ref 11.5–15.5)
WBC Count: 4.4 10*3/uL (ref 4.0–10.5)
nRBC: 0 % (ref 0.0–0.2)

## 2021-05-06 NOTE — Progress Notes (Signed)
[place]   [phone] Fax:([phone]   Clinic Follow up Note   Patient Care Team: Alcus Dad, MD as PCP - General (Family Medicine) Jovita Kussmaul, MD as Consulting Physician (General Surgery) Truitt Merle, MD as Consulting Physician (Hematology) Gery Pray, MD as Consulting Physician (Radiation Oncology) Alla Feeling, NP as Nurse Practitioner (Nurse Practitioner) 05/06/2021  CHIEF COMPLAINT: Follow up left breast cancer   SUMMARY OF ONCOLOGIC HISTORY: Oncology History Overview Note  Cancer Staging Malignant neoplasm of upper-inner quadrant of left breast in female, estrogen receptor positive ([place]) Staging form: Breast, AJCC 8th Edition - Clinical stage from 10/12/2017: Stage IB (cT2, cN0, cM0, G2, ER+, PR+, HER2-) - Signed by Truitt Merle, MD on 10/18/2017 - Pathologic stage from 11/01/2017: Stage IA (pT2, pN0, cM0, G3, ER+, PR+, HER2-, Oncotype DX score: 2) - Signed by Truitt Merle, MD on 01/03/2018     Malignant neoplasm of upper-inner quadrant of left breast in female, estrogen receptor positive ([place])  10/09/2017 Mammogram   Diagnostic Mammogram 10/09/17 IMPRESSION: 3.2 x 3.0 x 2.0 cm mass with imaging features highly suspicious for malignancy in the 11 o'clock position of the left breast.   10/11/2017 Initial Biopsy   Diagnosis 10/11/17 Breast, left, needle core biopsy, upper inner 11 o'clock - INVASIVE DUCTAL CARCINOMA WITH PAPILLARY FEATURES, SEE COMMENT.    10/11/2017 Receptors her2   Estrogen Receptor: 100%, POSITIVE, STRONG STAINING INTENSITY Progesterone Receptor: 80%, POSITIVE, STRONG STAINING INTENSITY Proliferation Marker Ki67: 15% HER2 - NEGATIVE   10/12/2017 Cancer Staging   Staging form: Breast, AJCC 8th Edition - Clinical stage from 10/12/2017: Stage IB (cT2, cN0, cM0, G2, ER+, PR+, HER2-) - Signed by Truitt Merle, MD on 10/18/2017    10/17/2017 Initial Diagnosis   Malignant neoplasm of upper-inner quadrant of left breast in  female, estrogen receptor positive ([place])   11/01/2017 Surgery    She had left breast lumpectomy with Dr. Marlou Starks on 11/01/2017   11/01/2017 Pathology Results   11/01/2017 Surgical Pathology Diagnosis 1. Lymph node, sentinel, biopsy, Left - NO CARCINOMA IDENTIFIED IN ONE LYMPH NODE (0/1) 2. Lymph node, sentinel, biopsy, Left - NO CARCINOMA IDENTIFIED IN ONE LYMPH NODE (0/1) 3. Lymph node, sentinel, biopsy, Left - NO CARCINOMA IDENTIFIED IN ONE LYMPH NODE (0/1) 4. Breast, lumpectomy, Left - INVASIVE MAMMARY CARCINOMA, WITH PAPILLARY AND MICROPAPILLARY FEATURES, NOTTINGHAM GRADE 3 OF 3, 3.1 CM - MARGINS UNINVOLVED BY CARCINOMA (0.2 CM; POSTERIOR MARGIN) - SEE ONCOLOGY TABLE AND COMMENT BELOW   11/01/2017 Oncotype testing   RS Score 2 Distant recurrence risk at 9 years 3% Group average absolute chemo benefit <1%   11/01/2017 Cancer Staging   Staging form: Breast, AJCC 8th Edition - Pathologic stage from 11/01/2017: Stage IA (pT2, pN0, cM0, G3, ER+, PR+, HER2-, Oncotype DX score: 2) - Signed by Truitt Merle, MD on 01/03/2018    12/12/2017 - 01/05/2018 Radiation Therapy   Daily radiation therapy with Dr. Sondra Come Radiation treatment dates:   12/11/17 - 01/05/18   Site/dose:  Total of 50.05 Gy in 20 fractions 1. Breast, Left/ 40.05 Gy delivered in 15 fractions of 2.67 Gy 2. Boost/ 10 Gy delivered in 5 fractions of 2 Gy   Beams/energy:    1. 3D, Photon/ 6X 2. Isodose Plan/ 6X   Narrative: The patient tolerated radiation treatment relatively well. She reported cording to the axilla that resolved over several days. By the end of treatments, she reported mild fatigue and slight sensitivity to the breast. Nurse note reported nipple  was red and sensitive, and skin slightly hyperpigmented with rash appearance.     03/2018 -  Anti-estrogen oral therapy   Tamoxifen $RemoveBe'20mg'[id number]$  daily starting 03/2018. D/c on 08/08/18 due to vaginal discharge. Start Anastrozole in 08/2018.    07/18/2018 Survivorship   SCP per Cira Rue, NP    11/08/2018 - 05/08/2020 Antibody Plan   Zometa for osteoporosis every 6 months for 2 years, 11/08/18-05/08/20     CURRENT THERAPY: Tamoxifen 20 mg daily, starting 03/2018. D/c 07/2018 due to vaginal discharge. Started anastrozole in 08/2018  INTERVAL HISTORY: [name] returns for follow up as scheduled. Last seen by Dr. Burr Medico 11/05/20.  She is doing well in general, remains active and exercises.  She recently got a dog.  Tolerating anastrozole.  Denies changes in her breasts or any new complaints.   MEDICAL HISTORY:  Past Medical History:  Diagnosis Date   Breast cancer, left ([place]) 09/2017   Hypertension    Personal history of radiation therapy     SURGICAL HISTORY: Past Surgical History:  Procedure Laterality Date   BLEPHAROPLASTY     lower eyelid   BREAST LUMPECTOMY     BREAST LUMPECTOMY WITH AXILLARY LYMPH NODE BIOPSY Left 11/01/2017   Procedure: BREAST LUMPECTOMY WITH SENTINEL LYMPH NODE MAPPING;  Surgeon: Jovita Kussmaul, MD;  Location: [facility];  Service: General;  Laterality: Left;    I have reviewed the social history and family history with the patient and they are unchanged from previous note.  ALLERGIES:  has No Known Allergies.  MEDICATIONS:  Current Outpatient Medications  Medication Sig Dispense Refill   ALPRAZolam (XANAX) 0.25 MG tablet Take 1 tablet (0.25 mg total) by mouth daily as needed for anxiety. 12 tablet 0   anastrozole (ARIMIDEX) 1 MG tablet Take 1 tablet (1 mg total) by mouth daily. 90 tablet 3   atorvastatin (LIPITOR) 10 MG tablet Take 1 tablet (10 mg total) by mouth daily. 90 tablet 0   Calcium Carbonate-Vit D-Min (CALCIUM 1200) 1200-1000 MG-UNIT CHEW Chew by mouth.     losartan (COZAAR) 25 MG tablet Take 1 tablet (25 mg total) by mouth daily. 90 tablet 3   naproxen sodium (ALEVE) 220 MG tablet Take 220 mg by mouth daily as needed.     tretinoin (RETIN-A) 0.1 % cream Apply topically at bedtime. 45 g 2   vitamin C (ASCORBIC  ACID) 500 MG tablet Take 500 mg by mouth daily.     No current facility-administered medications for this visit.    PHYSICAL EXAMINATION: ECOG PERFORMANCE STATUS: 0 - Asymptomatic  Vitals:   05/06/21 0959  BP: (!) 156/88  Pulse: 63  Resp: 16  Temp: 97.6 F (36.4 C)  SpO2: 99%   Filed Weights   05/06/21 0959  Weight: 113 lb 9.6 oz (51.5 kg)    GENERAL:alert, no distress and comfortable SKIN: No rash EYES: sclera clear NECK: Without mass LUNGS: normal breathing effort HEART:  no lower extremity edema Musculoskeletal: Nonfocal NEURO: alert & oriented x 3 with fluent speech, no focal motor/sensory deficits Breast exam: Breasts are symmetrical without nipple discharge or inversion.  S/p left lumpectomy, incision completely healed.  No palpable mass or nodularity in either breast or axilla that I could appreciate   LABORATORY DATA:  I have reviewed the data as listed CBC Latest Ref Rng & Units 05/06/2021 11/05/2020 05/08/2020  WBC 4.0 - 10.5 K/uL 4.4 5.4 5.1  Hemoglobin 12.0 - 15.0 g/dL 12.0 12.3 12.6  Hematocrit 36.0 -  46.0 % 38.5 38.3 40.0  Platelets 150 - 400 K/uL 245 260 225     CMP Latest Ref Rng & Units 05/06/2021 11/05/2020 05/08/2020  Glucose 70 - 99 mg/dL 98 102(H) 95  BUN 8 - 23 mg/dL 23 19 26(H)  Creatinine 0.44 - 1.00 mg/dL 0.77 0.82 0.77  Sodium 135 - 145 mmol/L 140 138 138  Potassium 3.5 - 5.1 mmol/L 4.0 4.1 3.6  Chloride 98 - 111 mmol/L 107 105 107  CO2 22 - 32 mmol/L $RemoveB'29 25 24  'WfEvJGPg$ Calcium 8.9 - 10.3 mg/dL 9.1 9.0 8.7(L)  Total Protein 6.5 - 8.1 g/dL 7.0 7.0 7.2  Total Bilirubin 0.3 - 1.2 mg/dL 0.4 0.4 0.4  Alkaline Phos 38 - 126 U/L 48 51 50  AST 15 - 41 U/L $Remo'22 21 20  'EeAiE$ ALT 0 - 44 U/L $Remo'15 17 15      'RBItK$ RADIOGRAPHIC STUDIES: I have personally reviewed the radiological images as listed and agreed with the findings in the report. No results found.   ASSESSMENT & PLAN: Syliva Mee is a 71 y.o. female with    1. Malignant neoplasm of upper-inner quadrant of  left breast, Stage IA, pT2N0M0, ER/PR: (+), HER2 (-), Grade III, RS 2 -Diagnosed in 09/2017. S/p left breast lumpectomy and adjuvant radiation. With Oncotype RS 2, adjuvant chemotherapy was not recommended  -She started anti-estrogen therapy with Tamoxifen in 03/2018, tolerated well initially but then developed vaginal discharge and uterine or bladder prolapse.  She switched to Anastrozole in 08/2018 and has been tolerating well  -Mammogram 10/16/2020 was negative -Continue Surveillance and anastrozole -Next routine surveillance visit in 6 months, continue healthy active lifestyle and other age-appropriate health maintenance/cancer screenings   2. Osteoporosis  -Her 03/2018 DEXA shows osteoporosis with lowest T-score of -3.6 at [facility]. Repeat on 10/15/20 showed slight improvement to -3.3, slightly improved  -She is now on anastrozole, which can weaken bone density -she received Zometa infusion q6 months for 2 years, 11/08/18-05/08/20. Tolerated well.  -She takes Vitamin D but not calcium due to constipation, she takes in dietary calcium.  -Repeat DEXA 09/2022   3. Hypertension   -Continue losartan   4. Uterine or bladder prolapse -Continue GYN follow-up     Disposition: Ms. Scheller is clinically doing well.  Tolerating anastrozole without significant side effects.  Breast exam is benign, labs unremarkable.  Overall there is no clinical concern for breast cancer recurrence.  She is 3.5 years from initial diagnosis, the recurrence risk has decreased, continue surveillance and anastrozole.  Next mammogram 7/23 and DEXA 7/24.  She will return for lab and surveillance visit in 6 months, or sooner if needed.   Orders Placed This Encounter  Procedures   MM DIAG BREAST TOMO BILATERAL    Standing Status:   Future    Standing Expiration Date:   05/06/2022    Order Specific Question:   Reason for Exam (SYMPTOM  OR DIAGNOSIS REQUIRED)    Answer:   History of left breast cancer 09/2017    Order Specific  Question:   Preferred imaging location?    Answer:   [facility]   All questions were answered. The patient knows to call the clinic with any problems, questions or concerns. No barriers to learning were detected.     Alla Feeling, NP 05/06/21

## 2021-05-21 ENCOUNTER — Other Ambulatory Visit: Payer: Self-pay | Admitting: Family Medicine

## 2021-05-21 DIAGNOSIS — I10 Essential (primary) hypertension: Secondary | ICD-10-CM

## 2021-06-28 ENCOUNTER — Other Ambulatory Visit: Payer: Self-pay | Admitting: Family Medicine

## 2021-06-28 DIAGNOSIS — E785 Hyperlipidemia, unspecified: Secondary | ICD-10-CM

## 2021-08-24 ENCOUNTER — Encounter: Payer: Self-pay | Admitting: *Deleted

## 2021-08-26 ENCOUNTER — Other Ambulatory Visit: Payer: Self-pay | Admitting: Family Medicine

## 2021-08-26 DIAGNOSIS — I10 Essential (primary) hypertension: Secondary | ICD-10-CM

## 2021-08-29 ENCOUNTER — Other Ambulatory Visit: Payer: Self-pay | Admitting: Family Medicine

## 2021-08-29 DIAGNOSIS — E785 Hyperlipidemia, unspecified: Secondary | ICD-10-CM

## 2021-10-18 ENCOUNTER — Ambulatory Visit
Admission: RE | Admit: 2021-10-18 | Discharge: 2021-10-18 | Disposition: A | Discharge status: Home / Self Care | Payer: Medicare Other | Source: Ambulatory Visit | Attending: Nurse Practitioner | Admitting: Nurse Practitioner

## 2021-10-18 DIAGNOSIS — R922 Inconclusive mammogram: Secondary | ICD-10-CM | POA: Diagnosis not present

## 2021-10-18 DIAGNOSIS — Z170 Estrogen receptor positive status [ER+]: Secondary | ICD-10-CM

## 2021-11-01 ENCOUNTER — Other Ambulatory Visit: Payer: Self-pay

## 2021-11-01 DIAGNOSIS — C50212 Malignant neoplasm of upper-inner quadrant of left female breast: Secondary | ICD-10-CM

## 2021-11-02 NOTE — Progress Notes (Unsigned)
[name]   Telephone:(336) [phone] Fax:(336) [phone]   Clinic Follow up Note   Patient Care Team: Alcus Dad, MD as PCP - General (Family Medicine) Jovita Kussmaul, MD as Consulting Physician (General Surgery) Truitt Merle, MD as Consulting Physician (Hematology) Gery Pray, MD as Consulting Physician (Radiation Oncology) Alla Feeling, NP as Nurse Practitioner (Nurse Practitioner) 11/03/2021  CHIEF COMPLAINT: Follow up left breast cancer   SUMMARY OF ONCOLOGIC HISTORY: Oncology History Overview Note  Cancer Staging Malignant neoplasm of upper-inner quadrant of left breast in female, estrogen receptor positive ([place]) Staging form: Breast, AJCC 8th Edition - Clinical stage from 10/12/2017: Stage IB (cT2, cN0, cM0, G2, ER+, PR+, HER2-) - Signed by Truitt Merle, MD on 10/18/2017 - Pathologic stage from 11/01/2017: Stage IA (pT2, pN0, cM0, G3, ER+, PR+, HER2-, Oncotype DX score: 2) - Signed by Truitt Merle, MD on 01/03/2018     Malignant neoplasm of upper-inner quadrant of left breast in female, estrogen receptor positive ([place])  10/09/2017 Mammogram   Diagnostic Mammogram 10/09/17 IMPRESSION: 3.2 x 3.0 x 2.0 cm mass with imaging features highly suspicious for malignancy in the 11 o'clock position of the left breast.   10/11/2017 Initial Biopsy   Diagnosis 10/11/17 Breast, left, needle core biopsy, upper inner 11 o'clock - INVASIVE DUCTAL CARCINOMA WITH PAPILLARY FEATURES, SEE COMMENT.    10/11/2017 Receptors her2   Estrogen Receptor: 100%, POSITIVE, STRONG STAINING INTENSITY Progesterone Receptor: 80%, POSITIVE, STRONG STAINING INTENSITY Proliferation Marker Ki67: 15% HER2 - NEGATIVE   10/12/2017 Cancer Staging   Staging form: Breast, AJCC 8th Edition - Clinical stage from 10/12/2017: Stage IB (cT2, cN0, cM0, G2, ER+, PR+, HER2-) - Signed by Truitt Merle, MD on 10/18/2017   10/17/2017 Initial Diagnosis   Malignant neoplasm of upper-inner quadrant of left breast in  female, estrogen receptor positive ([place])   11/01/2017 Surgery    She had left breast lumpectomy with Dr. Marlou Starks on 11/01/2017   11/01/2017 Pathology Results   11/01/2017 Surgical Pathology Diagnosis 1. Lymph node, sentinel, biopsy, Left - NO CARCINOMA IDENTIFIED IN ONE LYMPH NODE (0/1) 2. Lymph node, sentinel, biopsy, Left - NO CARCINOMA IDENTIFIED IN ONE LYMPH NODE (0/1) 3. Lymph node, sentinel, biopsy, Left - NO CARCINOMA IDENTIFIED IN ONE LYMPH NODE (0/1) 4. Breast, lumpectomy, Left - INVASIVE MAMMARY CARCINOMA, WITH PAPILLARY AND MICROPAPILLARY FEATURES, NOTTINGHAM GRADE 3 OF 3, 3.1 CM - MARGINS UNINVOLVED BY CARCINOMA (0.2 CM; POSTERIOR MARGIN) - SEE ONCOLOGY TABLE AND COMMENT BELOW   11/01/2017 Oncotype testing   RS Score 2 Distant recurrence risk at 9 years 3% Group average absolute chemo benefit <1%   11/01/2017 Cancer Staging   Staging form: Breast, AJCC 8th Edition - Pathologic stage from 11/01/2017: Stage IA (pT2, pN0, cM0, G3, ER+, PR+, HER2-, Oncotype DX score: 2) - Signed by Truitt Merle, MD on 01/03/2018   12/12/2017 - 01/05/2018 Radiation Therapy   Daily radiation therapy with Dr. Sondra Come Radiation treatment dates:   12/11/17 - 01/05/18   Site/dose:  Total of 50.05 Gy in 20 fractions 1. Breast, Left/ 40.05 Gy delivered in 15 fractions of 2.67 Gy 2. Boost/ 10 Gy delivered in 5 fractions of 2 Gy   Beams/energy:    1. 3D, Photon/ 6X 2. Isodose Plan/ 6X   Narrative: The patient tolerated radiation treatment relatively well. She reported cording to the axilla that resolved over several days. By the end of treatments, she reported mild fatigue and slight sensitivity to the breast. Nurse note reported nipple was red  and sensitive, and skin slightly hyperpigmented with rash appearance.     03/2018 -  Anti-estrogen oral therapy   Tamoxifen $RemoveBe'20mg'robcLsjKy$  daily starting 03/2018. D/c on 08/08/18 due to vaginal discharge. Start Anastrozole in 08/2018.    07/18/2018 Survivorship   SCP per Cira Rue, NP    11/08/2018 - 05/08/2020 Antibody Plan   Zometa for osteoporosis every 6 months for 2 years, 11/08/18-05/08/20     CURRENT THERAPY: Tamoxifen 20 mg daily, starting 03/2018. D/c 07/2018 due to vaginal discharge. Started anastrozole in 08/2018  INTERVAL HISTORY: [name] returns for follow up as scheduled. Last seen by me 05/06/21.  Mammogram 10/18/21 was negative.  Her health is very "consistent" overall, she has no specific complaints today.  Tolerating anastrozole.  Denies concerns in her breast such as new lump/mass, nipple discharge or inversion, or skin change.  She is up-to-date on age-appropriate health maintenance.   MEDICAL HISTORY:  Past Medical History:  Diagnosis Date   Breast cancer, left ([place]) 09/2017   Hypertension    Personal history of radiation therapy     SURGICAL HISTORY: Past Surgical History:  Procedure Laterality Date   BLEPHAROPLASTY     lower eyelid   BREAST LUMPECTOMY     BREAST LUMPECTOMY WITH AXILLARY LYMPH NODE BIOPSY Left 11/01/2017   Procedure: BREAST LUMPECTOMY WITH SENTINEL LYMPH NODE MAPPING;  Surgeon: Jovita Kussmaul, MD;  Location: [facility];  Service: General;  Laterality: Left;    I have reviewed the social history and family history with the patient and they are unchanged from previous note.  ALLERGIES:  has No Known Allergies.  MEDICATIONS:  Current Outpatient Medications  Medication Sig Dispense Refill   ALPRAZolam (XANAX) 0.25 MG tablet Take 1 tablet (0.25 mg total) by mouth daily as needed for anxiety. 12 tablet 0   anastrozole (ARIMIDEX) 1 MG tablet Take 1 tablet (1 mg total) by mouth daily. 90 tablet 3   atorvastatin (LIPITOR) 10 MG tablet TAKE 1 TABLET BY MOUTH EVERY DAY 90 tablet 0   Calcium Carbonate-Vit D-Min (CALCIUM 1200) 1200-1000 MG-UNIT CHEW Chew by mouth.     losartan (COZAAR) 25 MG tablet TAKE 1 TABLET (25 MG TOTAL) BY MOUTH DAILY. 90 tablet 0   naproxen sodium (ALEVE) 220 MG tablet Take 220 mg by  mouth daily as needed.     tretinoin (RETIN-A) 0.1 % cream Apply topically at bedtime. 45 g 2   vitamin C (ASCORBIC ACID) 500 MG tablet Take 500 mg by mouth daily.     No current facility-administered medications for this visit.    PHYSICAL EXAMINATION: ECOG PERFORMANCE STATUS: 0 - Asymptomatic  Vitals:   11/03/21 0939  BP: (!) 157/84  Pulse: (!) 59  Resp: 15  Temp: 97.7 F (36.5 C)  SpO2: 99%   Filed Weights   11/03/21 0939  Weight: 116 lb 1.6 oz (52.7 kg)    Patient declined physical exam, below are my visual observations GENERAL:alert, no distress and comfortable SKIN: No rash to exposed skin  EYES: sclera clear LUNGS:  normal breathing effort HEART:  no lower extremity edema Musculoskeletal:no cyanosis of digits and no clubbing  NEURO: alert & oriented x 3 with fluent speech, no focal motor deficits  LABORATORY DATA:  I have reviewed the data as listed    Latest Ref Rng & Units 11/03/2021    9:11 AM 05/06/2021    9:23 AM 11/05/2020    1:59 PM  CBC  WBC 4.0 - 10.5 K/uL 5.0  4.4  5.4   Hemoglobin 12.0 - 15.0 g/dL 11.9  12.0  12.3   Hematocrit 36.0 - 46.0 % 36.1  38.5  38.3   Platelets 150 - 400 K/uL 220  245  260         Latest Ref Rng & Units 11/03/2021    9:11 AM 05/06/2021    9:23 AM 11/05/2020    1:59 PM  CMP  Glucose 70 - 99 mg/dL 104  98  102   BUN 8 - 23 mg/dL $Remove'23  23  19   'acJwFla$ Creatinine 0.44 - 1.00 mg/dL 0.71  0.77  0.82   Sodium 135 - 145 mmol/L 139  140  138   Potassium 3.5 - 5.1 mmol/L 3.8  4.0  4.1   Chloride 98 - 111 mmol/L 109  107  105   CO2 22 - 32 mmol/L $RemoveB'26  29  25   'FjXAAlXx$ Calcium 8.9 - 10.3 mg/dL 8.9  9.1  9.0   Total Protein 6.5 - 8.1 g/dL 7.1  7.0  7.0   Total Bilirubin 0.3 - 1.2 mg/dL 0.6  0.4  0.4   Alkaline Phos 38 - 126 U/L 47  48  51   AST 15 - 41 U/L $Remo'20  22  21   'QrLps$ ALT 0 - 44 U/L $Remo'15  15  17       'LqOrg$ RADIOGRAPHIC STUDIES: I have personally reviewed the radiological images as listed and agreed with the findings in the report. No results  found.   ASSESSMENT & PLAN: Tammy Levine is a 71 y.o. female with    1. Malignant neoplasm of upper-inner quadrant of left breast, Stage IA, pT2N0M0, ER/PR: (+), HER2 (-), Grade III, RS 2 -Diagnosed in 09/2017. S/p left breast lumpectomy and adjuvant radiation. With Oncotype RS 2, adjuvant chemotherapy was not recommended  -She started anti-estrogen therapy with Tamoxifen in 03/2018, tolerated well initially but then developed vaginal discharge and uterine or bladder prolapse.  She switched to Anastrozole in 08/2018 and has been tolerating well. Goal 5-10 years. -Mammogram 10/18/2021 was negative -Ms. Ballantine is clinically doing well.  Tolerating anastrozole, labs are unremarkable.  Mammogram was negative.  She declined breast exam but overall no clinical concern for recurrence. -She is 4 years from initial diagnosis and definitive treatment, continue Surveillance and anastrozole was refilled today -Continue healthy active lifestyle and other age-appropriate health maintenance/cancer screenings -Follow-up in 6 months, or sooner if needed   2. Osteoporosis  -Her 03/2018 DEXA shows osteoporosis with lowest T-score of -3.6 at [facility]. Repeat on 10/15/20 showed slight improvement to -3.3, slightly improved  -She switched to anastrozole and received Zometa infusion q6 months for 2 years, 11/08/18-05/08/20. Tolerated well.  -She takes Vitamin D but not calcium due to constipation, she takes in dietary calcium.  -Repeat DEXA 09/2022   3. Hypertension   -Continue losartan   4. Uterine or bladder prolapse -Continue GYN follow-up     Plan: -Continue surveillance and anastrozole, refilled -F/up in 6 months -Mammo and dexa 09/2022   All questions were answered. The patient knows to call the clinic with any problems, questions or concerns. No barriers to learning were detected.      Alla Feeling, NP 11/03/21

## 2021-11-03 ENCOUNTER — Encounter: Payer: Self-pay | Admitting: Nurse Practitioner

## 2021-11-03 ENCOUNTER — Inpatient Hospital Stay: Payer: Medicare Other | Attending: Nurse Practitioner | Admitting: Nurse Practitioner

## 2021-11-03 ENCOUNTER — Other Ambulatory Visit: Payer: Self-pay

## 2021-11-03 ENCOUNTER — Inpatient Hospital Stay: Payer: Medicare Other

## 2021-11-03 VITALS — BP 157/84 | HR 59 | Temp 97.7°F | Resp 15 | Wt 116.1 lb

## 2021-11-03 DIAGNOSIS — I10 Essential (primary) hypertension: Secondary | ICD-10-CM | POA: Insufficient documentation

## 2021-11-03 DIAGNOSIS — Z79811 Long term (current) use of aromatase inhibitors: Secondary | ICD-10-CM | POA: Diagnosis not present

## 2021-11-03 DIAGNOSIS — M810 Age-related osteoporosis without current pathological fracture: Secondary | ICD-10-CM | POA: Diagnosis not present

## 2021-11-03 DIAGNOSIS — Z853 Personal history of malignant neoplasm of breast: Secondary | ICD-10-CM | POA: Insufficient documentation

## 2021-11-03 DIAGNOSIS — C50212 Malignant neoplasm of upper-inner quadrant of left female breast: Secondary | ICD-10-CM

## 2021-11-03 DIAGNOSIS — N8110 Cystocele, unspecified: Secondary | ICD-10-CM | POA: Diagnosis not present

## 2021-11-03 DIAGNOSIS — Z170 Estrogen receptor positive status [ER+]: Secondary | ICD-10-CM

## 2021-11-03 DIAGNOSIS — Z923 Personal history of irradiation: Secondary | ICD-10-CM | POA: Insufficient documentation

## 2021-11-03 LAB — COMPREHENSIVE METABOLIC PANEL
ALT: 15 U/L (ref 0–44)
AST: 20 U/L (ref 15–41)
Albumin: 4.1 g/dL (ref 3.5–5.0)
Alkaline Phosphatase: 47 U/L (ref 38–126)
Anion gap: 4 — ABNORMAL LOW (ref 5–15)
BUN: 23 mg/dL (ref 8–23)
CO2: 26 mmol/L (ref 22–32)
Calcium: 8.9 mg/dL (ref 8.9–10.3)
Chloride: 109 mmol/L (ref 98–111)
Creatinine, Ser: 0.71 mg/dL (ref 0.44–1.00)
GFR, Estimated: >60 mL/min (ref >60)
Glucose, Bld: 104 mg/dL — ABNORMAL HIGH (ref 70–99)
Potassium: 3.8 mmol/L (ref 3.5–5.1)
Sodium: 139 mmol/L (ref 135–145)
Total Bilirubin: 0.6 mg/dL (ref 0.3–1.2)
Total Protein: 7.1 g/dL (ref 6.5–8.1)

## 2021-11-03 LAB — CBC WITH DIFFERENTIAL/PLATELET
Abs Immature Granulocytes: 0 10*3/uL (ref 0.00–0.07)
Basophils Absolute: 0 10*3/uL (ref 0.0–0.1)
Basophils Relative: 1 %
Eosinophils Absolute: 0.3 10*3/uL (ref 0.0–0.5)
Eosinophils Relative: 6 %
HCT: 36.1 % (ref 36.0–46.0)
Hemoglobin: 11.9 g/dL — ABNORMAL LOW (ref 12.0–15.0)
Immature Granulocytes: 0 %
Lymphocytes Relative: 32 %
Lymphs Abs: 1.6 10*3/uL (ref 0.7–4.0)
MCH: 27.6 pg (ref 26.0–34.0)
MCHC: 33 g/dL (ref 30.0–36.0)
MCV: 83.8 fL (ref 80.0–100.0)
Monocytes Absolute: 0.5 10*3/uL (ref 0.1–1.0)
Monocytes Relative: 10 %
Neutro Abs: 2.6 10*3/uL (ref 1.7–7.7)
Neutrophils Relative %: 51 %
Platelets: 220 10*3/uL (ref 150–400)
RBC: 4.31 MIL/uL (ref 3.87–5.11)
RDW: 13.5 % (ref 11.5–15.5)
WBC: 5 10*3/uL (ref 4.0–10.5)
nRBC: 0 % (ref 0.0–0.2)

## 2021-11-03 MED ORDER — ANASTROZOLE 1 MG PO TABS
1.0000 mg | ORAL_TABLET | Freq: Every day | ORAL | 3 refills | Status: DC
Start: 2021-11-03 — End: 2022-10-25

## 2021-11-22 ENCOUNTER — Other Ambulatory Visit: Payer: Self-pay | Admitting: Family Medicine

## 2021-11-22 DIAGNOSIS — I10 Essential (primary) hypertension: Secondary | ICD-10-CM

## 2021-11-29 DIAGNOSIS — Z23 Encounter for immunization: Secondary | ICD-10-CM | POA: Diagnosis not present

## 2021-12-16 ENCOUNTER — Other Ambulatory Visit: Payer: Self-pay | Admitting: Family Medicine

## 2021-12-16 DIAGNOSIS — E785 Hyperlipidemia, unspecified: Secondary | ICD-10-CM

## 2021-12-17 DIAGNOSIS — Z23 Encounter for immunization: Secondary | ICD-10-CM | POA: Diagnosis not present

## 2022-02-21 ENCOUNTER — Other Ambulatory Visit: Payer: Self-pay | Admitting: Family Medicine

## 2022-02-21 DIAGNOSIS — I10 Essential (primary) hypertension: Secondary | ICD-10-CM

## 2022-02-21 DIAGNOSIS — E785 Hyperlipidemia, unspecified: Secondary | ICD-10-CM

## 2022-05-05 NOTE — Progress Notes (Signed)
[facility]   [phone] Fax:([phone]) [phone]   Clinic Follow up Note   Patient Care Team: Alcus Dad, MD as PCP - General (Family Medicine) Jovita Kussmaul, MD as Consulting Physician (General Surgery) Truitt Merle, MD as Consulting Physician (Hematology) Gery Pray, MD as Consulting Physician (Radiation Oncology) Alla Feeling, NP as Nurse Practitioner (Nurse Practitioner)  Date of Service:  05/06/2022  CHIEF COMPLAINT: f/u of  left breast cancer    CURRENT THERAPY:  Started anastrozole in 08/2018   ASSESSMENT:  Tammy Levine is a 72 y.o. female with   1. Malignant neoplasm of upper-inner quadrant of left breast, Stage IA, pT2N0M0, ER/PR: (+), HER2 (-), Grade III, RS 2 -She was diagnosed in 09/2017. She is s/p left breast lumpectomy and adjuvant radiation. With Oncotype RS 2, I did not recommend adjuvant chemotherapy.  -She started anti-estrogen therapy with Tamoxifen in 03/2018, tolerated well initially but then developed vaginal discharge and slight of uterine or bladder prolapse. I switched her to Anastrozole in 08/2018. She is tolerating well so far without issue.  -her most recent mammogram from 10/18/2021 was negative. -She is clinically doing well. Lab reviewed, her CBC and CMP are within normal limits. There is no clinical concern for recurrence. -Continue Surveillance.  -Continue anastrozole until June 2025.  We discussed the benefit of extended antiestrogen therapy beyond 5 years, due to her low recurrence score, significant osteoporosis, and some side effect from anastrozole, we will plan to complete 5 years then stop. -F/u in 6 months.    2. Osteoporosis  -Her 03/2018 DEXA shows osteoporosis with lowest T-score of -3.6 at [facility]. Repeat on 10/15/20 showed slight improvement to -3.3, slightly improved  -She is now on anastrozole, which she is aware can weaken her bones. -she received Zometa infusion every 6 months for 2 years,  11/08/18-05/08/20. Tolerated well.  -She will continue calcium and Vitamin D.  -Will repeat bone density scan in July 2024  PLAN: - Recommend to take the Anastrozole til summer 2025 -Order Mammogram/ bone density scan in July -lab reviewed  -Continue Anastrozole -f/u with NP Lacie in 6 months  SUMMARY OF ONCOLOGIC HISTORY: Oncology History Overview Note  Cancer Staging Malignant neoplasm of upper-inner quadrant of left breast in female, estrogen receptor positive ([place]) Staging form: Breast, AJCC 8th Edition - Clinical stage from 10/12/2017: Stage IB (cT2, cN0, cM0, G2, ER+, PR+, HER2-) - Signed by Truitt Merle, MD on 10/18/2017 - Pathologic stage from 11/01/2017: Stage IA (pT2, pN0, cM0, G3, ER+, PR+, HER2-, Oncotype DX score: 2) - Signed by Truitt Merle, MD on 01/03/2018     Malignant neoplasm of upper-inner quadrant of left breast in female, estrogen receptor positive ([place])  10/09/2017 Mammogram   Diagnostic Mammogram 10/09/17 IMPRESSION: 3.2 x 3.0 x 2.0 cm mass with imaging features highly suspicious for malignancy in the 11 o'clock position of the left breast.   10/11/2017 Initial Biopsy   Diagnosis 10/11/17 Breast, left, needle core biopsy, upper inner 11 o'clock - INVASIVE DUCTAL CARCINOMA WITH PAPILLARY FEATURES, SEE COMMENT.    10/11/2017 Receptors her2   Estrogen Receptor: 100%, POSITIVE, STRONG STAINING INTENSITY Progesterone Receptor: 80%, POSITIVE, STRONG STAINING INTENSITY Proliferation Marker Ki67: 15% HER2 - NEGATIVE   10/12/2017 Cancer Staging   Staging form: Breast, AJCC 8th Edition - Clinical stage from 10/12/2017: Stage IB (cT2, cN0, cM0, G2, ER+, PR+, HER2-) - Signed by Truitt Merle, MD on 10/18/2017   10/17/2017 Initial Diagnosis   Malignant neoplasm of upper-inner quadrant  of left breast in female, estrogen receptor positive ([place])   11/01/2017 Surgery    She had left breast lumpectomy with Dr. Marlou Starks on 11/01/2017   11/01/2017 Pathology Results   11/01/2017 Surgical  Pathology Diagnosis 1. Lymph node, sentinel, biopsy, Left - NO CARCINOMA IDENTIFIED IN ONE LYMPH NODE (0/1) 2. Lymph node, sentinel, biopsy, Left - NO CARCINOMA IDENTIFIED IN ONE LYMPH NODE (0/1) 3. Lymph node, sentinel, biopsy, Left - NO CARCINOMA IDENTIFIED IN ONE LYMPH NODE (0/1) 4. Breast, lumpectomy, Left - INVASIVE MAMMARY CARCINOMA, WITH PAPILLARY AND MICROPAPILLARY FEATURES, NOTTINGHAM GRADE 3 OF 3, 3.1 CM - MARGINS UNINVOLVED BY CARCINOMA (0.2 CM; POSTERIOR MARGIN) - SEE ONCOLOGY TABLE AND COMMENT BELOW   11/01/2017 Oncotype testing   RS Score 2 Distant recurrence risk at 9 years 3% Group average absolute chemo benefit <1%   11/01/2017 Cancer Staging   Staging form: Breast, AJCC 8th Edition - Pathologic stage from 11/01/2017: Stage IA (pT2, pN0, cM0, G3, ER+, PR+, HER2-, Oncotype DX score: 2) - Signed by Truitt Merle, MD on 01/03/2018   12/12/2017 - 01/05/2018 Radiation Therapy   Daily radiation therapy with Dr. Sondra Come Radiation treatment dates:   12/11/17 - 01/05/18   Site/dose:  Total of 50.05 Gy in 20 fractions 1. Breast, Left/ 40.05 Gy delivered in 15 fractions of 2.67 Gy 2. Boost/ 10 Gy delivered in 5 fractions of 2 Gy   Beams/energy:    1. 3D, Photon/ 6X 2. Isodose Plan/ 6X   Narrative: The patient tolerated radiation treatment relatively well. She reported cording to the axilla that resolved over several days. By the end of treatments, she reported mild fatigue and slight sensitivity to the breast. Nurse note reported nipple was red and sensitive, and skin slightly hyperpigmented with rash appearance.     03/2018 -  Anti-estrogen oral therapy   Tamoxifen 72m daily starting 03/2018. D/c on 08/08/18 due to vaginal discharge. Start Anastrozole in 08/2018.    07/18/2018 Survivorship   SCP per LCira Rue NP    11/08/2018 - 05/08/2020 Antibody Plan   Zometa for osteoporosis every 6 months for 2 years, 11/08/18-05/08/20      INTERVAL HISTORY:  Tammy Levine here for  a follow up of   left breast cancer  She was last seen by NP Lacie  on 05/06/2021 She presents to the clinic alone. Pt denies having any issues. Pt state for the past couple of years  since being on Anastrozole she doesn't feel like she has enough energy.    All other systems were reviewed with the patient and are negative.  MEDICAL HISTORY:  Past Medical History:  Diagnosis Date   Breast cancer, left ([facility] 09/2017   Hypertension    Personal history of radiation therapy     SURGICAL HISTORY: Past Surgical History:  Procedure Laterality Date   BLEPHAROPLASTY     lower eyelid   BREAST LUMPECTOMY     BREAST LUMPECTOMY WITH AXILLARY LYMPH NODE BIOPSY Left 11/01/2017   Procedure: BREAST LUMPECTOMY WITH SENTINEL LYMPH NODE MAPPING;  Surgeon: TJovita Kussmaul MD;  [facility]  Service: General;  Laterality: Left;    I have reviewed the social history and family history with the patient and they are unchanged from previous note.  ALLERGIES:  has No Known Allergies.  MEDICATIONS:  Current Outpatient Medications  Medication Sig Dispense Refill   ALPRAZolam (XANAX) 0.25 MG tablet Take 1 tablet (0.25 mg total) by mouth daily as needed for anxiety. 12 tablet  0   anastrozole (ARIMIDEX) 1 MG tablet Take 1 tablet (1 mg total) by mouth daily. 90 tablet 3   atorvastatin (LIPITOR) 10 MG tablet TAKE 1 TABLET BY MOUTH EVERY DAY 90 tablet 0   Calcium Carbonate-Vit D-Min (CALCIUM 1200) 1200-1000 MG-UNIT CHEW Chew by mouth.     losartan (COZAAR) 25 MG tablet TAKE 1 TABLET (25 MG TOTAL) BY MOUTH DAILY. 90 tablet 0   naproxen sodium (ALEVE) 220 MG tablet Take 220 mg by mouth daily as needed.     tretinoin (RETIN-A) 0.1 % cream Apply topically at bedtime. 45 g 2   vitamin C (ASCORBIC ACID) 500 MG tablet Take 500 mg by mouth daily.     No current facility-administered medications for this visit.    PHYSICAL EXAMINATION: ECOG PERFORMANCE STATUS: 0 - Asymptomatic  Vitals:    05/06/22 0912  BP: (!) 158/88  Pulse: 65  Resp: 18  Temp: 97.6 F (36.4 C)  SpO2: 100%   Wt Readings from Last 3 Encounters:  05/06/22 119 lb (54 kg)  11/03/21 116 lb 1.6 oz (52.7 kg)  05/06/21 113 lb 9.6 oz (51.5 kg)      NECK: (-) supple, thyroid normal size, non-tender, without nodularity LYMPH:  (-) no palpable lymphadenopathy in the cervical, axillary  BREAST: Lt Breast Lumpectomy ,little scare tissue around the nipple. Rt breast no palpable mass, breast exam benign.     LABORATORY DATA:  I have reviewed the data as listed    Latest Ref Rng & Units 05/06/2022    8:44 AM 11/03/2021    9:11 AM 05/06/2021    9:23 AM  CBC  WBC 4.0 - 10.5 K/uL 4.9  5.0  4.4   Hemoglobin 12.0 - 15.0 g/dL 12.4  11.9  12.0   Hematocrit 36.0 - 46.0 % 38.9  36.1  38.5   Platelets 150 - 400 K/uL 292  220  245         Latest Ref Rng & Units 05/06/2022    8:44 AM 11/03/2021    9:11 AM 05/06/2021    9:23 AM  CMP  Glucose 70 - 99 mg/dL 73  104  98   BUN 8 - 23 mg/dL 26  23  23   $ Creatinine 0.44 - 1.00 mg/dL 0.65  0.71  0.77   Sodium 135 - 145 mmol/L 142  139  140   Potassium 3.5 - 5.1 mmol/L 3.9  3.8  4.0   Chloride 98 - 111 mmol/L 109  109  107   CO2 22 - 32 mmol/L 28  26  29   $ Calcium 8.9 - 10.3 mg/dL 9.2  8.9  9.1   Total Protein 6.5 - 8.1 g/dL 7.2  7.1  7.0   Total Bilirubin 0.3 - 1.2 mg/dL 0.4  0.6  0.4   Alkaline Phos 38 - 126 U/L 65  47  48   AST 15 - 41 U/L 21  20  22   $ ALT 0 - 44 U/L 18  15  15       $ RADIOGRAPHIC STUDIES: I have personally reviewed the radiological images as listed and agreed with the findings in the report. No results found.    Orders Placed This Encounter  Procedures   MM Digital Screening    Standing Status:   Future    Standing Expiration Date:   05/06/2023    Order Specific Question:   Reason for Exam (SYMPTOM  OR DIAGNOSIS REQUIRED)    Answer:  screening    Order Specific Question:   Preferred imaging location?    Answer:   [facility]    DG Bone Density    Standing Status:   Future    Standing Expiration Date:   05/06/2023    Order Specific Question:   Reason for Exam (SYMPTOM  OR DIAGNOSIS REQUIRED)    Answer:   screening    Order Specific Question:   Preferred imaging location?    Answer:   [facility]   All questions were answered. The patient knows to call the clinic with any problems, questions or concerns. No barriers to learning was detected. The total time spent in the appointment was 30 minutes.     Truitt Merle, MD 05/06/2022   Felicity Coyer, CMA, am acting as scribe for Truitt Merle, MD.   I have reviewed the above documentation for accuracy and completeness, and I agree with the above.

## 2022-05-06 ENCOUNTER — Inpatient Hospital Stay (HOSPITAL_BASED_OUTPATIENT_CLINIC_OR_DEPARTMENT_OTHER): Payer: Medicare Other | Admitting: Hematology

## 2022-05-06 ENCOUNTER — Inpatient Hospital Stay: Payer: Medicare Other | Attending: Hematology

## 2022-05-06 ENCOUNTER — Encounter: Payer: Self-pay | Admitting: Hematology

## 2022-05-06 VITALS — BP 158/88 | HR 65 | Temp 97.6°F | Resp 18 | Wt 119.0 lb

## 2022-05-06 DIAGNOSIS — C50212 Malignant neoplasm of upper-inner quadrant of left female breast: Secondary | ICD-10-CM | POA: Diagnosis not present

## 2022-05-06 DIAGNOSIS — Z170 Estrogen receptor positive status [ER+]: Secondary | ICD-10-CM | POA: Insufficient documentation

## 2022-05-06 DIAGNOSIS — I10 Essential (primary) hypertension: Secondary | ICD-10-CM | POA: Insufficient documentation

## 2022-05-06 DIAGNOSIS — E2839 Other primary ovarian failure: Secondary | ICD-10-CM

## 2022-05-06 DIAGNOSIS — Z79811 Long term (current) use of aromatase inhibitors: Secondary | ICD-10-CM | POA: Diagnosis not present

## 2022-05-06 DIAGNOSIS — M810 Age-related osteoporosis without current pathological fracture: Secondary | ICD-10-CM | POA: Insufficient documentation

## 2022-05-06 DIAGNOSIS — Z1231 Encounter for screening mammogram for malignant neoplasm of breast: Secondary | ICD-10-CM

## 2022-05-06 LAB — CBC WITH DIFFERENTIAL (CANCER CENTER ONLY)
Abs Immature Granulocytes: 0.01 10*3/uL (ref 0.00–0.07)
Basophils Absolute: 0 10*3/uL (ref 0.0–0.1)
Basophils Relative: 1 %
Eosinophils Absolute: 0.2 10*3/uL (ref 0.0–0.5)
Eosinophils Relative: 5 %
HCT: 38.9 % (ref 36.0–46.0)
Hemoglobin: 12.4 g/dL (ref 12.0–15.0)
Immature Granulocytes: 0 %
Lymphocytes Relative: 31 %
Lymphs Abs: 1.5 10*3/uL (ref 0.7–4.0)
MCH: 27.3 pg (ref 26.0–34.0)
MCHC: 31.9 g/dL (ref 30.0–36.0)
MCV: 85.5 fL (ref 80.0–100.0)
Monocytes Absolute: 0.5 10*3/uL (ref 0.1–1.0)
Monocytes Relative: 10 %
Neutro Abs: 2.6 10*3/uL (ref 1.7–7.7)
Neutrophils Relative %: 53 %
Platelet Count: 292 10*3/uL (ref 150–400)
RBC: 4.55 MIL/uL (ref 3.87–5.11)
RDW: 13.7 % (ref 11.5–15.5)
WBC Count: 4.9 10*3/uL (ref 4.0–10.5)
nRBC: 0 % (ref 0.0–0.2)

## 2022-05-06 LAB — CMP (CANCER CENTER ONLY)
ALT: 18 U/L (ref 0–44)
AST: 21 U/L (ref 15–41)
Albumin: 4.1 g/dL (ref 3.5–5.0)
Alkaline Phosphatase: 65 U/L (ref 38–126)
Anion gap: 5 (ref 5–15)
BUN: 26 mg/dL — ABNORMAL HIGH (ref 8–23)
CO2: 28 mmol/L (ref 22–32)
Calcium: 9.2 mg/dL (ref 8.9–10.3)
Chloride: 109 mmol/L (ref 98–111)
Creatinine: 0.65 mg/dL (ref 0.44–1.00)
GFR, Estimated: >60 mL/min (ref >60)
Glucose, Bld: 73 mg/dL (ref 70–99)
Potassium: 3.9 mmol/L (ref 3.5–5.1)
Sodium: 142 mmol/L (ref 135–145)
Total Bilirubin: 0.4 mg/dL (ref 0.3–1.2)
Total Protein: 7.2 g/dL (ref 6.5–8.1)

## 2022-05-21 ENCOUNTER — Other Ambulatory Visit: Payer: Self-pay | Admitting: Family Medicine

## 2022-05-21 DIAGNOSIS — I10 Essential (primary) hypertension: Secondary | ICD-10-CM

## 2022-05-23 ENCOUNTER — Other Ambulatory Visit: Payer: Self-pay | Admitting: Family Medicine

## 2022-05-23 DIAGNOSIS — E785 Hyperlipidemia, unspecified: Secondary | ICD-10-CM

## 2022-07-28 ENCOUNTER — Telehealth: Payer: Self-pay | Admitting: Family Medicine

## 2022-07-28 NOTE — Telephone Encounter (Signed)
Contacted Lilan Masso to schedule their annual wellness visit. Appointment made for 07/29/2022.  Thank you,  [facility] Support [facility] Direct dial  [phone]

## 2022-07-29 ENCOUNTER — Ambulatory Visit (INDEPENDENT_AMBULATORY_CARE_PROVIDER_SITE_OTHER): Payer: Medicare Other

## 2022-07-29 NOTE — Patient Instructions (Signed)

## 2022-07-29 NOTE — Progress Notes (Unsigned)
I connected with  Tammy Levine on 07/29/22 by a audio enabled telemedicine application and verified that I am speaking with the correct person using two identifiers.  Patient Location: Home  Provider Location: Home Office  I discussed the limitations of evaluation and management by telemedicine. The patient expressed understanding and agreed to proceed.   Subjective:   Tammy Levine is a 72 y.o. female who presents for an Initial Medicare Annual Wellness Visit.  Review of Systems    Per HPI unless specifically indicated below.  Cardiac Risk Factors include: advanced age (>48men, >68 women);female gender, Essential Hypertensin, and Hyperlipidemia.          Objective:       05/06/2022    9:12 AM 11/03/2021    9:39 AM 05/06/2021    9:59 AM  Vitals with BMI  Height   5\' 3"   Weight 119 lbs 116 lbs 2 oz 113 lbs 10 oz  BMI   20.13  Systolic 158 157 161  Diastolic 88 84 88  Pulse 65 59 63    There were no vitals filed for this visit. There is no height or weight on file to calculate BMI.     04/02/2021    9:29 AM 05/27/2020    3:14 PM 11/18/2019    2:12 PM 05/10/2019   10:27 AM 09/10/2018    2:52 PM 08/17/2018    4:55 PM 02/05/2018    3:18 PM  Advanced Directives  Does Patient Have a Medical Advance Directive? No No No No No No No  Would patient like information on creating a medical advance directive? No - Patient declined No - Patient declined No - Patient declined Yes (MAU/Ambulatory/Procedural Areas - Information given)  Yes (ED - Information included in AVS)     Current Medications (verified) Outpatient Encounter Medications as of 07/29/2022  Medication Sig   ALPRAZolam (XANAX) 0.25 MG tablet Take 1 tablet (0.25 mg total) by mouth daily as needed for anxiety.   anastrozole (ARIMIDEX) 1 MG tablet Take 1 tablet (1 mg total) by mouth daily.   atorvastatin (LIPITOR) 10 MG tablet TAKE 1 TABLET BY MOUTH EVERY DAY   Calcium Carbonate-Vit D-Min (CALCIUM 1200) 1200-1000  MG-UNIT CHEW Chew by mouth.   losartan (COZAAR) 25 MG tablet TAKE 1 TABLET (25 MG TOTAL) BY MOUTH DAILY.   naproxen sodium (ALEVE) 220 MG tablet Take 220 mg by mouth daily as needed.   tretinoin (RETIN-A) 0.1 % cream Apply topically at bedtime.   vitamin C (ASCORBIC ACID) 500 MG tablet Take 500 mg by mouth daily.   No facility-administered encounter medications on file as of 07/29/2022.    Allergies (verified) Patient has no known allergies.   History: Past Medical History:  Diagnosis Date   Breast cancer, left ([facility]) 09/2017   Hypertension    Personal history of radiation therapy    Past Surgical History:  Procedure Laterality Date   BLEPHAROPLASTY     lower eyelid   BREAST LUMPECTOMY     BREAST LUMPECTOMY WITH AXILLARY LYMPH NODE BIOPSY Left 11/01/2017   Procedure: BREAST LUMPECTOMY WITH SENTINEL LYMPH NODE MAPPING;  Surgeon: Griselda Miner, MD;  Location: [facility];  Service: General;  Laterality: Left;   Family History  Problem Relation Age of Onset   Chronic Renal Failure Mother    Breast cancer Mother 52   Chronic Renal Failure Father    Breast cancer Cousin        72s   Social History  Socioeconomic History   Marital status: Single    Spouse name: Not on file   Number of children: Not on file   Years of education: Not on file   Highest education level: Not on file  Occupational History   Not on file  Tobacco Use   Smoking status: Never   Smokeless tobacco: Never  Vaping Use   Vaping Use: Never used  Substance and Sexual Activity   Alcohol use: Yes    Comment: occasionally   Drug use: Never   Sexual activity: Not Currently    Birth control/protection: Post-menopausal    Comment: 1st intercourse 72 yo-more than 5 partners  Other Topics Concern   Not on file  Social History Narrative   Not on file   Social Determinants of Health   Financial Resource Strain: Not on file  Food Insecurity: Not on file  Transportation Needs: Not on file   Physical Activity: Not on file  Stress: Not on file  Social Connections: Not on file    Tobacco Counseling Counseling given: Not Answered   Clinical Intake:                 Diabetic?No         Activities of Daily Living     No data to display          Patient Care Team: Maury Dus, MD as PCP - General (Family Medicine) Griselda Miner, MD as Consulting Physician (General Surgery) Malachy Mood, MD as Consulting Physician (Hematology) Antony Blackbird, MD as Consulting Physician (Radiation Oncology) Pollyann Samples, NP as Nurse Practitioner (Nurse Practitioner)  Indicate any recent Medical Services you may have received from other than [facility] providers in the past year (date may be approximate).     Assessment:   This is a routine wellness examination for Tammy Levine.   Hearing/Vision screen Denies any hearing issues. Denies any change to her vision. Wear glasses. Annual Eye Exam.   Dietary issues and exercise activities discussed:     Goals Addressed   None    Depression Screen    04/02/2021    9:29 AM 12/15/2020   11:13 AM 05/27/2020    3:15 PM 11/18/2019    2:11 PM 01/25/2019    3:22 PM 09/14/2018    1:37 PM 02/05/2018    3:22 PM  PHQ 2/9 Scores  PHQ - 2 Score 0 0 0 0 0 0 0  PHQ- 9 Score 0 0 0 0       Fall Risk    04/02/2021    9:29 AM 12/15/2020   11:13 AM 11/18/2019    2:11 PM 01/25/2019    3:22 PM 09/14/2018    1:37 PM  Fall Risk   Falls in the past year? 0 0 0 0 0  Number falls in past yr:   0 0 0  Injury with Fall?   0    Follow up    Falls evaluation completed Falls evaluation completed    FALL RISK PREVENTION PERTAINING TO THE HOME:  Any stairs in or around the home? {YES/NO:21197} If so, are there any without handrails? {YES/NO:21197} Home free of loose throw rugs in walkways, pet beds, electrical cords, etc? {YES/NO:21197} Adequate lighting in your home to reduce risk of falls? {YES/NO:21197}  ASSISTIVE DEVICES UTILIZED TO  PREVENT FALLS:  Life alert? {YES/NO:21197} Use of a cane, walker or w/c? {YES/NO:21197} Grab bars in the bathroom? {YES/NO:21197} Shower chair or bench in shower? {YES/NO:21197} Elevated toilet seat  or a handicapped toilet? {YES/NO:21197}  TIMED UP AND GO:  Was the test performed?Unable to perform, virtual appointment   Cognitive Function:        Immunizations Immunization History  Administered Date(s) Administered   Fluad Quad(high Dose 65+) 12/06/2018, 12/15/2020   Influenza, High Dose Seasonal PF 12/06/2018   Influenza,inj,Quad PF,6+ Mos 11/24/2017   Influenza-Unspecified 04/22/2016, 12/23/2016, 12/26/2019, 11/29/2021   PFIZER(Purple Top)SARS-COV-2 Vaccination 06/02/2019, 06/22/2019, 01/14/2020   Pfizer Covid-19 Vaccine Bivalent Booster 23yrs & up 01/05/2021   Pneumococcal Conjugate-13 11/24/2017   Pneumococcal Polysaccharide-23 01/25/2019   Zoster Recombinat (Shingrix) 12/06/2018, 02/21/2019    TDAP status: Due, Education has been provided regarding the importance of this vaccine. Advised may receive this vaccine at local pharmacy or Health Dept. Aware to provide a copy of the vaccination record if obtained from local pharmacy or Health Dept. Verbalized acceptance and understanding.  Flu Vaccine status: Up to date  Pneumococcal vaccine status: Up to date  Covid-19 vaccine status: Information provided on how to obtain vaccines.   Qualifies for Shingles Vaccine? Yes   Zostavax completed Yes   Shingrix Completed?: Yes  Screening Tests Health Maintenance  Topic Date Due   Medicare Annual Wellness (AWV)  Never done   DTaP/Tdap/Td (1 - Tdap) Never done   COLONOSCOPY (Pts 45-34yrs Insurance coverage will need to be confirmed)  Never done   COVID-19 Vaccine (5 - 2023-24 season) 11/19/2021   MAMMOGRAM  10/19/2022   INFLUENZA VACCINE  10/20/2022   Pneumonia Vaccine 57+ Years old  Completed   DEXA SCAN  Completed   Hepatitis C Screening  Completed   Zoster Vaccines-  Shingrix  Completed   HPV VACCINES  Aged Out    Health Maintenance  Health Maintenance Due  Topic Date Due   Medicare Annual Wellness (AWV)  Never done   DTaP/Tdap/Td (1 - Tdap) Never done   COLONOSCOPY (Pts 45-45yrs Insurance coverage will need to be confirmed)  Never done   COVID-19 Vaccine (5 - 2023-24 season) 11/19/2021    Colorectal cancer screening: Referral to GI placed ***. Pt aware the office will call re: appt.  Mammogram status: Ordered 10/20/2022. Pt provided with contact info and advised to call to schedule appt.   DEXA Scan: scheduled 10/20/2022  Lung Cancer Screening: (Low Dose CT Chest recommended if Age 22-80 years, 30 pack-year currently smoking OR have quit w/in 15years.) does not qualify.   Lung Cancer Screening Referral: not applicable   Additional Screening:  Hepatitis C Screening: does qualify; Completed 11/24/2017  Vision Screening: Recommended annual ophthalmology exams for early detection of glaucoma and other disorders of the eye. Is the patient up to date with their annual eye exam?  Yes  Who is the provider or what is the name of the office in which the patient attends annual eye exams? *** If pt is not established with a provider, would they like to be referred to a provider to establish care? No .   Dental Screening: Recommended annual dental exams for proper oral hygiene  Community Resource Referral / Chronic Care Management: CRR required this visit?  No   CCM required this visit?  No      Plan:     I have personally reviewed and noted the following in the patient's chart:   Medical and social history Use of alcohol, tobacco or illicit drugs  Current medications and supplements including opioid prescriptions. Patient is not currently taking opioid prescriptions. Functional ability and status Nutritional status Physical activity Advanced directives  List of other physicians Hospitalizations, surgeries, and ER visits in previous 12  months Vitals Screenings to include cognitive, depression, and falls Referrals and appointments  In addition, I have reviewed and discussed with patient certain preventive protocols, quality metrics, and best practice recommendations. A written personalized care plan for preventive services as well as general preventive health recommendations were provided to patient.    Tammy Levine , Thank you for taking time to come for your Medicare Wellness Visit. I appreciate your ongoing commitment to your health goals. Please review the following plan we discussed and let me know if I can assist you in the future.   These are the goals we discussed:  Goals   None     This is a list of the screening recommended for you and due dates:  Health Maintenance  Topic Date Due   DTaP/Tdap/Td vaccine (1 - Tdap) Never done   Colon Cancer Screening  Never done   COVID-19 Vaccine (5 - 2023-24 season) 11/19/2021   Mammogram  10/19/2022   Flu Shot  10/20/2022   Medicare Annual Wellness Visit  07/29/2023   Pneumonia Vaccine  Completed   DEXA scan (bone density measurement)  Completed   Hepatitis C Screening: USPSTF Recommendation to screen - Ages 18-79 yo.  Completed   Zoster (Shingles) Vaccine  Completed   HPV Vaccine  Aged Out    Tammy Levine , Thank you for taking time to come for your Medicare Wellness Visit. I appreciate your ongoing commitment to your health goals. Please review the following plan we discussed and let me know if I can assist you in the future.   These are the goals we discussed:  Goals   None     This is a list of the screening recommended for you and due dates:  Health Maintenance  Topic Date Due   DTaP/Tdap/Td vaccine (1 - Tdap) Never done   Colon Cancer Screening  Never done   COVID-19 Vaccine (5 - 2023-24 season) 11/19/2021   Mammogram  10/19/2022   Flu Shot  10/20/2022   Medicare Annual Wellness Visit  07/29/2023   Pneumonia Vaccine  Completed   DEXA scan (bone  density measurement)  Completed   Hepatitis C Screening: USPSTF Recommendation to screen - Ages 18-79 yo.  Completed   Zoster (Shingles) Vaccine  Completed   HPV Vaccine  Aged [address] [place]   07/29/2022   Nurse Notes: Approximately 30 minute Non-Face -To-Face Medicare Wellness Visit

## 2022-08-11 ENCOUNTER — Other Ambulatory Visit: Payer: Self-pay | Admitting: Family Medicine

## 2022-08-11 DIAGNOSIS — I10 Essential (primary) hypertension: Secondary | ICD-10-CM

## 2022-08-11 DIAGNOSIS — E785 Hyperlipidemia, unspecified: Secondary | ICD-10-CM

## 2022-08-25 ENCOUNTER — Other Ambulatory Visit: Payer: Self-pay | Admitting: Family Medicine

## 2022-08-25 DIAGNOSIS — E785 Hyperlipidemia, unspecified: Secondary | ICD-10-CM

## 2022-08-25 DIAGNOSIS — I10 Essential (primary) hypertension: Secondary | ICD-10-CM

## 2022-10-20 ENCOUNTER — Ambulatory Visit
Admission: RE | Admit: 2022-10-20 | Discharge: 2022-10-20 | Disposition: A | Discharge status: Home / Self Care | Payer: Medicare Other | Source: Ambulatory Visit | Attending: Hematology | Admitting: Hematology

## 2022-10-20 DIAGNOSIS — E2839 Other primary ovarian failure: Secondary | ICD-10-CM

## 2022-10-20 DIAGNOSIS — N958 Other specified menopausal and perimenopausal disorders: Secondary | ICD-10-CM | POA: Diagnosis not present

## 2022-10-20 DIAGNOSIS — Z1231 Encounter for screening mammogram for malignant neoplasm of breast: Secondary | ICD-10-CM | POA: Diagnosis not present

## 2022-10-20 DIAGNOSIS — E349 Endocrine disorder, unspecified: Secondary | ICD-10-CM | POA: Diagnosis not present

## 2022-10-20 DIAGNOSIS — M8588 Other specified disorders of bone density and structure, other site: Secondary | ICD-10-CM | POA: Diagnosis not present

## 2022-10-24 ENCOUNTER — Other Ambulatory Visit: Payer: Self-pay | Admitting: Nurse Practitioner

## 2022-11-02 NOTE — Progress Notes (Unsigned)
Patient Care Team: Ivery Quale, MD as PCP - General Griselda Miner, MD as Consulting Physician (General Surgery) Malachy Mood, MD as Consulting Physician (Hematology) Antony Blackbird, MD as Consulting Physician (Radiation Oncology) Pollyann Samples, NP as Nurse Practitioner (Nurse Practitioner)   CHIEF COMPLAINT: Follow up left breast cancer   Oncology History Overview Note  Cancer Staging Malignant neoplasm of upper-inner quadrant of left breast in female, estrogen receptor positive [facility]) Staging form: Breast, AJCC 8th Edition - Clinical stage from 10/12/2017: Stage IB (cT2, cN0, cM0, G2, ER+, PR+, HER2-) - Signed by Malachy Mood, MD on 10/18/2017 - Pathologic stage from 11/01/2017: Stage IA (pT2, pN0, cM0, G3, ER+, PR+, HER2-, Oncotype DX score: 2) - Signed by Malachy Mood, MD on 01/03/2018     Malignant neoplasm of upper-inner quadrant of left breast in female, estrogen receptor positive (HCC)  10/09/2017 Mammogram   Diagnostic Mammogram 10/09/17 IMPRESSION: 3.2 x 3.0 x 2.0 cm mass with imaging features highly suspicious for malignancy in the 11 o'clock position of the left breast.   10/11/2017 Initial Biopsy   Diagnosis 10/11/17 Breast, left, needle core biopsy, upper inner 11 o'clock - INVASIVE DUCTAL CARCINOMA WITH PAPILLARY FEATURES, SEE COMMENT.    10/11/2017 Receptors her2   Estrogen Receptor: 100%, POSITIVE, STRONG STAINING INTENSITY Progesterone Receptor: 80%, POSITIVE, STRONG STAINING INTENSITY Proliferation Marker Ki67: 15% HER2 - NEGATIVE   10/12/2017 Cancer Staging   Staging form: Breast, AJCC 8th Edition - Clinical stage from 10/12/2017: Stage IB (cT2, cN0, cM0, G2, ER+, PR+, HER2-) - Signed by Malachy Mood, MD on 10/18/2017   10/17/2017 Initial Diagnosis   Malignant neoplasm of upper-inner quadrant of left breast in female, estrogen receptor positive (HCC)   11/01/2017 Surgery    She had left breast lumpectomy with Dr. Carolynne Edouard on 11/01/2017   11/01/2017 Pathology Results    11/01/2017 Surgical Pathology Diagnosis 1. Lymph node, sentinel, biopsy, Left - NO CARCINOMA IDENTIFIED IN ONE LYMPH NODE (0/1) 2. Lymph node, sentinel, biopsy, Left - NO CARCINOMA IDENTIFIED IN ONE LYMPH NODE (0/1) 3. Lymph node, sentinel, biopsy, Left - NO CARCINOMA IDENTIFIED IN ONE LYMPH NODE (0/1) 4. Breast, lumpectomy, Left - INVASIVE MAMMARY CARCINOMA, WITH PAPILLARY AND MICROPAPILLARY FEATURES, NOTTINGHAM GRADE 3 OF 3, 3.1 CM - MARGINS UNINVOLVED BY CARCINOMA (0.2 CM; POSTERIOR MARGIN) - SEE ONCOLOGY TABLE AND COMMENT BELOW   11/01/2017 Oncotype testing   RS Score 2 Distant recurrence risk at 9 years 3% Group average absolute chemo benefit <1%   11/01/2017 Cancer Staging   Staging form: Breast, AJCC 8th Edition - Pathologic stage from 11/01/2017: Stage IA (pT2, pN0, cM0, G3, ER+, PR+, HER2-, Oncotype DX score: 2) - Signed by Malachy Mood, MD on 01/03/2018   12/12/2017 - 01/05/2018 Radiation Therapy   Daily radiation therapy with Dr. Roselind Messier Radiation treatment dates:   12/11/17 - 01/05/18   Site/dose:  Total of 50.05 Gy in 20 fractions 1. Breast, Left/ 40.05 Gy delivered in 15 fractions of 2.67 Gy 2. Boost/ 10 Gy delivered in 5 fractions of 2 Gy   Beams/energy:    1. 3D, Photon/ 6X 2. Isodose Plan/ 6X   Narrative: The patient tolerated radiation treatment relatively well. She reported cording to the axilla that resolved over several days. By the end of treatments, she reported mild fatigue and slight sensitivity to the breast. Nurse note reported nipple was red and sensitive, and skin slightly hyperpigmented with rash appearance.     03/2018 -  Anti-estrogen oral therapy   Tamoxifen  20mg  daily starting 03/2018. D/c on 08/08/18 due to vaginal discharge. Start Anastrozole in 08/2018.    07/18/2018 Survivorship   SCP per Santiago Glad, NP    11/08/2018 - 05/08/2020 Antibody Plan   Zometa for osteoporosis every 6 months for 2 years, 11/08/18-05/08/20      CURRENT THERAPY: Tamoxifen 20  mg daily, starting 03/2018. D/c 07/2018 due to vaginal discharge. Started anastrozole in 08/2018    INTERVAL HISTORY Tammy Levine returns for follow up as scheduled. Last seen by Dr. Mosetta Putt 05/06/2022.  She had recent DEXA and mammogram.  Denies breast concerns such as new lump/mass, nipple discharge or inversion, or skin change.  She remains active and exercises, takes vitamin D and gets calcium from her diet.  Denies fall or fracture.  Tolerating tamoxifen but has noticed some fatigue and weight gain.  ROS  All other systems reviewed and negative  Past Medical History:  Diagnosis Date   Breast cancer, left (HCC) 09/2017   Hypertension    Personal history of radiation therapy      Past Surgical History:  Procedure Laterality Date   BLEPHAROPLASTY     lower eyelid   BREAST LUMPECTOMY     BREAST LUMPECTOMY WITH AXILLARY LYMPH NODE BIOPSY Left 11/01/2017   Procedure: BREAST LUMPECTOMY WITH SENTINEL LYMPH NODE MAPPING;  Surgeon: Griselda Miner, MD;  Location: [facility];  Service: General;  Laterality: Left;     Outpatient Encounter Medications as of 11/03/2022  Medication Sig   ALPRAZolam (XANAX) 0.25 MG tablet Take 1 tablet (0.25 mg total) by mouth daily as needed for anxiety.   anastrozole (ARIMIDEX) 1 MG tablet TAKE 1 TABLET BY MOUTH EVERY DAY   atorvastatin (LIPITOR) 10 MG tablet TAKE 1 TABLET BY MOUTH DAILY. PLEASE SCHEDULE OFFICE VISIT PRIOR TO ANY ADDITIONAL REFILLS.   losartan (COZAAR) 25 MG tablet TAKE 1 TABLET BY MOUTH DAILY. PLEASE SCHEDULE OFFICE VISIT PRIOR TO ANY ADDITIONAL REFILLS.   naproxen sodium (ALEVE) 220 MG tablet Take 220 mg by mouth daily as needed.   tretinoin (RETIN-A) 0.1 % cream Apply topically at bedtime.   vitamin C (ASCORBIC ACID) 500 MG tablet Take 500 mg by mouth daily.   [DISCONTINUED] Calcium Carbonate-Vit D-Min (CALCIUM 1200) 1200-1000 MG-UNIT CHEW Chew by mouth.   No facility-administered encounter medications on file as of 11/03/2022.      Today's Vitals   11/03/22 0921  BP: 136/85  Pulse: 64  Resp: 15  Temp: 97.7 F (36.5 C)  TempSrc: Tympanic  SpO2: 97%  Weight: 119 lb 12.8 oz (54.3 kg)   Body mass index is 21.22 kg/m.   PHYSICAL EXAM GENERAL:alert, no distress and comfortable SKIN: no rash  EYES: sclera clear NECK: without mass LYMPH:  no palpable cervical or supraclavicular lymphadenopathy  LUNGS: clear with normal breathing effort HEART: regular rate & rhythm, no lower extremity edema ABDOMEN: abdomen soft, non-tender and normal bowel sounds NEURO: alert & oriented x 3 with fluent speech, no focal motor/sensory deficits Breast exam:  PAC without erythema    CBC    Component Value Date/Time   WBC 4.7 11/03/2022 0858   WBC 5.0 11/03/2021 0911   RBC 4.57 11/03/2022 0858   HGB 12.4 11/03/2022 0858   HCT 38.6 11/03/2022 0858   PLT 249 11/03/2022 0858   MCV 84.5 11/03/2022 0858   MCH 27.1 11/03/2022 0858   MCHC 32.1 11/03/2022 0858   RDW 13.7 11/03/2022 0858   LYMPHSABS 1.4 11/03/2022 0858   MONOABS 0.5 11/03/2022  0858   EOSABS 0.2 11/03/2022 0858   BASOSABS 0.1 11/03/2022 0858     CMP     Component Value Date/Time   NA 138 11/03/2022 0858   NA 142 12/26/2017 1555   K 4.1 11/03/2022 0858   CL 104 11/03/2022 0858   CO2 28 11/03/2022 0858   GLUCOSE 96 11/03/2022 0858   BUN 20 11/03/2022 0858   BUN 23 12/26/2017 1555   CREATININE 0.78 11/03/2022 0858   CALCIUM 9.1 11/03/2022 0858   PROT 7.1 11/03/2022 0858   ALBUMIN 4.1 11/03/2022 0858   AST 19 11/03/2022 0858   ALT 11 11/03/2022 0858   ALKPHOS 54 11/03/2022 0858   BILITOT 0.4 11/03/2022 0858   GFRNONAA >60 11/03/2022 0858   GFRAA >60 11/07/2019 1108     ASSESSMENT & PLAN:Tammy Levine is a 72 y.o. female with    1. Malignant neoplasm of upper-inner quadrant of left breast, Stage IA, pT2N0M0, ER/PR: (+), HER2 (-), Grade III, RS 2 -Diagnosed in 09/2017. S/p left breast lumpectomy and adjuvant radiation. With Oncotype RS 2, adjuvant  chemotherapy was not recommended  -She started anti-estrogen therapy with Tamoxifen in 03/2018, tolerated well initially but then developed vaginal discharge and uterine or bladder prolapse.  She switched to Anastrozole in 08/2018 and has been tolerating well. Goal 5-10 years. -Ms. Salonga is clinically doing well.  Tolerating anastrozole but has noticed gradual fatigue and weight gain without any other identifiable cause, this is likely related to AI.  She agrees to continue for now -She preferred to skip breast exam given the recent mammogram 10/20/22 which was negative. Labs normal -Overall no clinical concern for recurrence. Continue breast cancer surveillance.  - Next visit already scheduled 05/08/23 with Dr. Mosetta Putt.   2. Osteoporosis  -Her 03/2018 DEXA shows osteoporosis with lowest T-score of -3.6 at [facility]. Repeat on 10/15/20 showed slight improvement to -3.3, slightly improved  -She switched to anastrozole and received Zometa infusion q6 months for 2 years, 11/08/18-05/08/20. Tolerated well.  -She takes Vitamin D but not calcium due to constipation, she takes in dietary calcium.  -Repeat DEXA 10/20/22 showed T score -2.7 in RFN but -5.3 in forearm (not previously measured), this is significant osteoporosis  -We discussed various options, including adding q6 mo Prolia or Xgeva injections or oral Fosamax, switching to tamoxifen, or stopping AI early (she will complete 5 years antiestrogen therapy in 03/2023). She strongly dissliked tamoxifen and does not want to retry. -She would like to continue her current regimen, think about it, and discuss with Dr. Mosetta Putt at next visit in 6 months  3. Hypertension   -Continue losartan      PLAN: -Recent DEXA, mammogram, and today's labs reviewed -Continue vit D, dietary calcium, and weight bearing exercise -Continue breast cancer surveillance and Anastrozole -F/up with Dr Mosetta Putt 05/08/23 as scheduled    All questions were answered. The patient knows to call the  clinic with any problems, questions or concerns. No barriers to learning were detected. I spent 20 minutes counseling the patient face to face. The total time spent in the appointment was 30 minutes and more than 50% was on counseling, review of test results, and coordination of care.   Santiago Glad, NP-C 11/03/2022

## 2022-11-03 ENCOUNTER — Inpatient Hospital Stay (HOSPITAL_BASED_OUTPATIENT_CLINIC_OR_DEPARTMENT_OTHER): Payer: Medicare Other | Admitting: Nurse Practitioner

## 2022-11-03 ENCOUNTER — Other Ambulatory Visit: Payer: Self-pay

## 2022-11-03 ENCOUNTER — Inpatient Hospital Stay: Payer: Medicare Other | Attending: Nurse Practitioner

## 2022-11-03 ENCOUNTER — Encounter: Payer: Self-pay | Admitting: Nurse Practitioner

## 2022-11-03 VITALS — BP 136/85 | HR 64 | Temp 97.7°F | Resp 15 | Wt 119.8 lb

## 2022-11-03 DIAGNOSIS — C50212 Malignant neoplasm of upper-inner quadrant of left female breast: Secondary | ICD-10-CM | POA: Insufficient documentation

## 2022-11-03 DIAGNOSIS — M810 Age-related osteoporosis without current pathological fracture: Secondary | ICD-10-CM | POA: Insufficient documentation

## 2022-11-03 DIAGNOSIS — Z9221 Personal history of antineoplastic chemotherapy: Secondary | ICD-10-CM | POA: Insufficient documentation

## 2022-11-03 DIAGNOSIS — Z170 Estrogen receptor positive status [ER+]: Secondary | ICD-10-CM | POA: Insufficient documentation

## 2022-11-03 DIAGNOSIS — Z79811 Long term (current) use of aromatase inhibitors: Secondary | ICD-10-CM | POA: Diagnosis not present

## 2022-11-03 DIAGNOSIS — Z923 Personal history of irradiation: Secondary | ICD-10-CM | POA: Diagnosis not present

## 2022-11-03 LAB — CMP (CANCER CENTER ONLY)
ALT: 11 U/L (ref 0–44)
AST: 19 U/L (ref 15–41)
Albumin: 4.1 g/dL (ref 3.5–5.0)
Alkaline Phosphatase: 54 U/L (ref 38–126)
Anion gap: 6 (ref 5–15)
BUN: 20 mg/dL (ref 8–23)
CO2: 28 mmol/L (ref 22–32)
Calcium: 9.1 mg/dL (ref 8.9–10.3)
Chloride: 104 mmol/L (ref 98–111)
Creatinine: 0.78 mg/dL (ref 0.44–1.00)
GFR, Estimated: >60 mL/min (ref >60)
Glucose, Bld: 96 mg/dL (ref 70–99)
Potassium: 4.1 mmol/L (ref 3.5–5.1)
Sodium: 138 mmol/L (ref 135–145)
Total Bilirubin: 0.4 mg/dL (ref 0.3–1.2)
Total Protein: 7.1 g/dL (ref 6.5–8.1)

## 2022-11-03 LAB — CBC WITH DIFFERENTIAL (CANCER CENTER ONLY)
Abs Immature Granulocytes: 0.01 10*3/uL (ref 0.00–0.07)
Basophils Absolute: 0.1 10*3/uL (ref 0.0–0.1)
Basophils Relative: 1 %
Eosinophils Absolute: 0.2 10*3/uL (ref 0.0–0.5)
Eosinophils Relative: 4 %
HCT: 38.6 % (ref 36.0–46.0)
Hemoglobin: 12.4 g/dL (ref 12.0–15.0)
Immature Granulocytes: 0 %
Lymphocytes Relative: 29 %
Lymphs Abs: 1.4 10*3/uL (ref 0.7–4.0)
MCH: 27.1 pg (ref 26.0–34.0)
MCHC: 32.1 g/dL (ref 30.0–36.0)
MCV: 84.5 fL (ref 80.0–100.0)
Monocytes Absolute: 0.5 10*3/uL (ref 0.1–1.0)
Monocytes Relative: 10 %
Neutro Abs: 2.6 10*3/uL (ref 1.7–7.7)
Neutrophils Relative %: 56 %
Platelet Count: 249 10*3/uL (ref 150–400)
RBC: 4.57 MIL/uL (ref 3.87–5.11)
RDW: 13.7 % (ref 11.5–15.5)
WBC Count: 4.7 10*3/uL (ref 4.0–10.5)
nRBC: 0 % (ref 0.0–0.2)

## 2022-12-09 DIAGNOSIS — Z23 Encounter for immunization: Secondary | ICD-10-CM | POA: Diagnosis not present

## 2022-12-20 ENCOUNTER — Ambulatory Visit (INDEPENDENT_AMBULATORY_CARE_PROVIDER_SITE_OTHER): Payer: Medicare Other | Admitting: Family Medicine

## 2022-12-20 VITALS — BP 138/75 | HR 79 | Temp 98.7°F | Wt 118.6 lb

## 2022-12-20 DIAGNOSIS — I10 Essential (primary) hypertension: Secondary | ICD-10-CM

## 2022-12-20 DIAGNOSIS — E785 Hyperlipidemia, unspecified: Secondary | ICD-10-CM | POA: Diagnosis not present

## 2022-12-20 DIAGNOSIS — Z0000 Encounter for general adult medical examination without abnormal findings: Secondary | ICD-10-CM

## 2022-12-20 DIAGNOSIS — Z23 Encounter for immunization: Secondary | ICD-10-CM

## 2022-12-20 DIAGNOSIS — L988 Other specified disorders of the skin and subcutaneous tissue: Secondary | ICD-10-CM | POA: Diagnosis not present

## 2022-12-20 MED ORDER — TETANUS-DIPHTH-ACELL PERTUSSIS 5-2.5-18.5 LF-MCG/0.5 IM SUSY: 0.5000 mL | PREFILLED_SYRINGE | Freq: Once | INTRAMUSCULAR | 0 refills | Status: AC

## 2022-12-20 MED ORDER — TRETINOIN 0.1 % EX CREA
TOPICAL_CREAM | Freq: Every day | CUTANEOUS | 1 refills | Status: AC
Start: 2022-12-20 — End: ?

## 2022-12-20 MED ORDER — ATORVASTATIN CALCIUM 10 MG PO TABS
10.0000 mg | ORAL_TABLET | Freq: Every day | ORAL | 3 refills | Status: DC
Start: 2022-12-20 — End: 2023-03-20

## 2022-12-20 MED ORDER — LOSARTAN POTASSIUM 25 MG PO TABS
25.0000 mg | ORAL_TABLET | Freq: Every day | ORAL | 3 refills | Status: DC
Start: 2022-12-20 — End: 2023-03-20

## 2022-12-20 NOTE — Patient Instructions (Addendum)
Thank you for visiting clinic today - it is always our pleasure to care for you.  Today we discussed your overall health and medications.  Your blood pressure appears well-controlled at this time so we will continue your losartan medication as before.  We are checking in on your cholesterol levels today- we will continue your atorvastatin as written in the meantime and make any changes as needed.  We have provided a prescription for a fecal occult blood testing kit for colon cancer screening - we recommend this yearly.  We have also provided a prescription for a tetanus vaccine if you are interested.  You received your annual flu shot today.  Please schedule an appointment in a year for your next well visit or sooner as needed.  Reach out any time with any questions or concerns you may have - we are here for you!  Tammy Quale, MD [facility] [phone]

## 2022-12-20 NOTE — Progress Notes (Signed)
    SUBJECTIVE:   CHIEF COMPLAINT / HPI: Chronic disease management and meet provider  HTN Currently taking losartan 25 daily.  She does not regularly check her blood pressure at home, however she notes that over the last year her blood pressure has been in the 130s/70s at her various clinic visits.  No HA or VC. No dizziness or lightheadedness.  HLD Currently taking atorvastatin 10 daily.  Last lipid panel in 2022, unremarkable at the time.  No recent muscle pain or cramps.   PERTINENT  PMH / PSH: HTN, HLD, Breast Cancer  OBJECTIVE:   BP 138/75   Pulse 79   Temp 98.7 F (37.1 C)   Wt 118 lb 9.6 oz (53.8 kg)   LMP 03/21/2002   SpO2 97%   BMI 21.01 kg/m   General: Well-appearing. Resting comfortably in room. HEENT: Sclera without icterus or injection.  No cervical lymphadenopathy. CV: Normal S1/S2. No extra heart sounds. Warm and well-perfused. Pulm: Breathing comfortably on room air. CTAB. No increased WOB. Abd: Soft, non-tender, non-distended. Skin:  Warm, dry. Psych: Pleasant and appropriate.   ASSESSMENT/PLAN:   HTN Blood pressure 138/75 in clinic today.  Blood pressure appears well-controlled overall. -Continue current regimen losartan 25 daily  HLD Lipid panel in 2022 was unremarkable.  No myopathies from statin identified. -Ordered lipid panel -Continue current regimen of atorvastatin 10 daily.  Adjust as needed based on lipid panel.   Health maintenance: -Received high-dose flu vaccine today -Given Tdap prescription that patient may take to preferred pharmacy -Fecal occult blood testing kit ordered  Return to clinic annual well check or sooner as needed.  Ivery Quale, MD [facility]

## 2022-12-21 ENCOUNTER — Encounter: Payer: Self-pay | Admitting: Family Medicine

## 2022-12-21 LAB — LIPID PANEL
Chol/HDL Ratio: 2 {ratio} (ref 0.0–4.4)
Cholesterol, Total: 163 mg/dL (ref 100–199)
HDL: 82 mg/dL (ref >39)
LDL Chol Calc (NIH): 71 mg/dL (ref 0–99)
Triglycerides: 45 mg/dL (ref 0–149)
VLDL Cholesterol Cal: 10 mg/dL (ref 5–40)

## 2022-12-26 ENCOUNTER — Encounter: Payer: Self-pay | Admitting: Family Medicine

## 2022-12-27 DIAGNOSIS — Z0000 Encounter for general adult medical examination without abnormal findings: Secondary | ICD-10-CM | POA: Diagnosis not present

## 2022-12-29 LAB — FECAL OCCULT BLOOD, IMMUNOCHEMICAL: Fecal Occult Bld: NEGATIVE

## 2023-03-20 ENCOUNTER — Other Ambulatory Visit: Payer: Self-pay | Admitting: Family Medicine

## 2023-03-20 DIAGNOSIS — I10 Essential (primary) hypertension: Secondary | ICD-10-CM

## 2023-03-20 DIAGNOSIS — E785 Hyperlipidemia, unspecified: Secondary | ICD-10-CM

## 2023-05-07 NOTE — Assessment & Plan Note (Signed)
 Her 03/2018 DEXA shows osteoporosis with lowest T-score of -3.6 at [facility]. Repeat on 10/15/20 showed slight improvement to -3.3, slightly improved  -She switched to anastrozole and received Zometa infusion q6 months for 2 years, 11/08/18-05/08/20. Tolerated well.  -She takes Vitamin D but not calcium due to constipation, she takes in dietary calcium.  -Repeat DEXA 10/20/22 showed T score -2.7 in RFN but -5.3 in forearm (not previously measured), this is significant osteoporosis  -We discussed various options, including adding q6 mo Prolia or zometa for 2 years, potential AE reviewed with her

## 2023-05-07 NOTE — Assessment & Plan Note (Signed)
 tage IA, pT2N0M0, ER/PR: (+), HER2 (-), Grade III, RS 2 -Diagnosed in 09/2017. S/p left breast lumpectomy and adjuvant radiation. With Oncotype RS 2, adjuvant chemotherapy was not recommended  -She started anti-estrogen therapy with Tamoxifen in 03/2018, tolerated well initially but then developed vaginal discharge and uterine or bladder prolapse.  She switched to Anastrozole in 08/2018 and has been tolerating well. We discussed the role of BCI to determine if she needs AI beyond 5 years

## 2023-05-08 ENCOUNTER — Inpatient Hospital Stay: Payer: Medicare Other

## 2023-05-08 ENCOUNTER — Other Ambulatory Visit: Payer: Self-pay | Admitting: *Deleted

## 2023-05-08 ENCOUNTER — Other Ambulatory Visit: Payer: Self-pay

## 2023-05-08 ENCOUNTER — Encounter: Payer: Self-pay | Admitting: Hematology

## 2023-05-08 ENCOUNTER — Inpatient Hospital Stay: Payer: Medicare Other | Attending: Hematology | Admitting: Hematology

## 2023-05-08 VITALS — BP 179/88 | HR 60 | Temp 97.0°F | Resp 16 | Wt 123.3 lb

## 2023-05-08 DIAGNOSIS — C50212 Malignant neoplasm of upper-inner quadrant of left female breast: Secondary | ICD-10-CM

## 2023-05-08 DIAGNOSIS — Z1231 Encounter for screening mammogram for malignant neoplasm of breast: Secondary | ICD-10-CM

## 2023-05-08 DIAGNOSIS — Z923 Personal history of irradiation: Secondary | ICD-10-CM | POA: Insufficient documentation

## 2023-05-08 DIAGNOSIS — Z79811 Long term (current) use of aromatase inhibitors: Secondary | ICD-10-CM | POA: Diagnosis not present

## 2023-05-08 DIAGNOSIS — Z1732 Human epidermal growth factor receptor 2 negative status: Secondary | ICD-10-CM | POA: Diagnosis not present

## 2023-05-08 DIAGNOSIS — Z170 Estrogen receptor positive status [ER+]: Secondary | ICD-10-CM | POA: Diagnosis not present

## 2023-05-08 DIAGNOSIS — Z1721 Progesterone receptor positive status: Secondary | ICD-10-CM | POA: Diagnosis not present

## 2023-05-08 DIAGNOSIS — M810 Age-related osteoporosis without current pathological fracture: Secondary | ICD-10-CM | POA: Diagnosis not present

## 2023-05-08 LAB — CMP (CANCER CENTER ONLY)
ALT: 15 U/L (ref 0–44)
AST: 19 U/L (ref 15–41)
Albumin: 4.2 g/dL (ref 3.5–5.0)
Alkaline Phosphatase: 50 U/L (ref 38–126)
Anion gap: 5 (ref 5–15)
BUN: 21 mg/dL (ref 8–23)
CO2: 27 mmol/L (ref 22–32)
Calcium: 9 mg/dL (ref 8.9–10.3)
Chloride: 106 mmol/L (ref 98–111)
Creatinine: 0.78 mg/dL (ref 0.44–1.00)
GFR, Estimated: >60 mL/min (ref >60)
Glucose, Bld: 93 mg/dL (ref 70–99)
Potassium: 3.8 mmol/L (ref 3.5–5.1)
Sodium: 138 mmol/L (ref 135–145)
Total Bilirubin: 0.5 mg/dL (ref 0.0–1.2)
Total Protein: 6.9 g/dL (ref 6.5–8.1)

## 2023-05-08 LAB — CBC WITH DIFFERENTIAL (CANCER CENTER ONLY)
Abs Immature Granulocytes: 0.01 10*3/uL (ref 0.00–0.07)
Basophils Absolute: 0.1 10*3/uL (ref 0.0–0.1)
Basophils Relative: 1 %
Eosinophils Absolute: 0.2 10*3/uL (ref 0.0–0.5)
Eosinophils Relative: 4 %
HCT: 38.8 % (ref 36.0–46.0)
Hemoglobin: 12.4 g/dL (ref 12.0–15.0)
Immature Granulocytes: 0 %
Lymphocytes Relative: 31 %
Lymphs Abs: 1.5 10*3/uL (ref 0.7–4.0)
MCH: 27 pg (ref 26.0–34.0)
MCHC: 32 g/dL (ref 30.0–36.0)
MCV: 84.5 fL (ref 80.0–100.0)
Monocytes Absolute: 0.5 10*3/uL (ref 0.1–1.0)
Monocytes Relative: 10 %
Neutro Abs: 2.7 10*3/uL (ref 1.7–7.7)
Neutrophils Relative %: 54 %
Platelet Count: 233 10*3/uL (ref 150–400)
RBC: 4.59 MIL/uL (ref 3.87–5.11)
RDW: 13.4 % (ref 11.5–15.5)
WBC Count: 4.9 10*3/uL (ref 4.0–10.5)
nRBC: 0 % (ref 0.0–0.2)

## 2023-05-08 NOTE — Progress Notes (Signed)
 [facility]   [phone] Fax:([phone]   Clinic Follow up Note   Patient Care Team: Ivery Quale, MD as PCP - General Griselda Miner, MD as Consulting Physician (General Surgery) Malachy Mood, MD as Consulting Physician (Hematology) Antony Blackbird, MD as Consulting Physician (Radiation Oncology) Pollyann Samples, NP as Nurse Practitioner (Nurse Practitioner)  Date of Service:  05/08/2023  CHIEF COMPLAINT: f/u of breast cancer  CURRENT THERAPY:  Anastrozole 1 mg daily since June 2020  Oncology History   Malignant neoplasm of upper-inner quadrant of left breast in female, estrogen receptor positive (HCC) tage IA, pT2N0M0, ER/PR: (+), HER2 (-), Grade III, RS 2 -Diagnosed in 09/2017. S/p left breast lumpectomy and adjuvant radiation. With Oncotype RS 2, adjuvant chemotherapy was not recommended  -She started anti-estrogen therapy with Tamoxifen in 03/2018, tolerated well initially but then developed vaginal discharge and uterine or bladder prolapse.  She switched to Anastrozole in 08/2018 and has been tolerating well. We discussed the role of BCI to determine if she needs AI beyond 5 years   Osteoporosis Her 03/2018 DEXA shows osteoporosis with lowest T-score of -3.6 at [facility]. Repeat on 10/15/20 showed slight improvement to -3.3, slightly improved  -She switched to anastrozole and received Zometa infusion q6 months for 2 years, 11/08/18-05/08/20. Tolerated well.  -She takes Vitamin D but not calcium due to constipation, she takes in dietary calcium.  -Repeat DEXA 10/20/22 showed T score -2.7 in RFN but -5.3 in forearm (not previously measured), this is significant osteoporosis  -She previously received Zometa every 6 months from 2020-2022 -I will refer her to endocrine clinic for further management.   Assessment and Plan    Breast Cancer Follow-up 73 year old female with a history of breast cancer, currently on anastrozole for five years. She reports no new  symptoms but wishes to discontinue anastrozole due to side effects (reduced energy, weight gain). Oncotype DX recurrence score is low (2), indicating a low risk of recurrence (3% with anastrozole or tamoxifen, 5% without). Discussed stopping anastrozole after five years or performing a Breast Index test to determine the benefit of extended antiestrogen therapy. She agreed to the Breast Index test. If the test shows no additional benefit, she can stop anastrozole. - Perform Breast Index test - Continue anastrozole at least until June 2025 - Order contrast-enhanced mammogram for August 2025 - Annual follow-up after five years of diagnosis  Osteoporosis Osteoporosis with a previous Zometa infusion in February 2022. Bone density scan shows high risk of fracture in the left forearm. She is not taking calcium or vitamin D due to constipation from calcium supplements. Discussed the importance of vitamin D and calcium for bone health and potential side effects of osteoporosis medications. - Start vitamin D 1000 units daily - Refer to endocrinologist for osteoporosis management - Advise on fall prevention and weight-bearing exercises  General Health Maintenance Proactive about diet and exercise. Blood work shows normal blood counts, kidney, and liver function. Mammograms have been clear, and she performs regular self-breast exams. - Continue healthy diet and regular exercise - Monitor for any new pain or symptoms, especially in bones - Use MyChart for lab results and communication  Plan -She is clinically doing well, lab reviewed, exam unremarkable - Call breast center to schedule contrast-enhanced mammogram for August 2025 -I will refer her to endocrine clinic for osteoporosis management -Will order breast index test to see if she benefits from prolonged AI, will finish 5 years treatment in June 2025  SUMMARY OF ONCOLOGIC HISTORY: Oncology History Overview Note  Cancer Staging Malignant  neoplasm of upper-inner quadrant of left breast in female, estrogen receptor positive (HCC) Staging form: Breast, AJCC 8th Edition - Clinical stage from 10/12/2017: Stage IB (cT2, cN0, cM0, G2, ER+, PR+, HER2-) - Signed by Malachy Mood, MD on 10/18/2017 - Pathologic stage from 11/01/2017: Stage IA (pT2, pN0, cM0, G3, ER+, PR+, HER2-, Oncotype DX score: 2) - Signed by Malachy Mood, MD on 01/03/2018     Malignant neoplasm of upper-inner quadrant of left breast in female, estrogen receptor positive (HCC)  10/09/2017 Mammogram   Diagnostic Mammogram 10/09/17 IMPRESSION: 3.2 x 3.0 x 2.0 cm mass with imaging features highly suspicious for malignancy in the 11 o'clock position of the left breast.   10/11/2017 Initial Biopsy   Diagnosis 10/11/17 Breast, left, needle core biopsy, upper inner 11 o'clock - INVASIVE DUCTAL CARCINOMA WITH PAPILLARY FEATURES, SEE COMMENT.    10/11/2017 Receptors her2   Estrogen Receptor: 100%, POSITIVE, STRONG STAINING INTENSITY Progesterone Receptor: 80%, POSITIVE, STRONG STAINING INTENSITY Proliferation Marker Ki67: 15% HER2 - NEGATIVE   10/12/2017 Cancer Staging   Staging form: Breast, AJCC 8th Edition - Clinical stage from 10/12/2017: Stage IB (cT2, cN0, cM0, G2, ER+, PR+, HER2-) - Signed by Malachy Mood, MD on 10/18/2017   10/17/2017 Initial Diagnosis   Malignant neoplasm of upper-inner quadrant of left breast in female, estrogen receptor positive (HCC)   11/01/2017 Surgery    She had left breast lumpectomy with Dr. Carolynne Edouard on 11/01/2017   11/01/2017 Pathology Results   11/01/2017 Surgical Pathology Diagnosis 1. Lymph node, sentinel, biopsy, Left - NO CARCINOMA IDENTIFIED IN ONE LYMPH NODE (0/1) 2. Lymph node, sentinel, biopsy, Left - NO CARCINOMA IDENTIFIED IN ONE LYMPH NODE (0/1) 3. Lymph node, sentinel, biopsy, Left - NO CARCINOMA IDENTIFIED IN ONE LYMPH NODE (0/1) 4. Breast, lumpectomy, Left - INVASIVE MAMMARY CARCINOMA, WITH PAPILLARY AND MICROPAPILLARY FEATURES,  [place]AM GRADE 3 OF 3, 3.1 CM - MARGINS UNINVOLVED BY CARCINOMA (0.2 CM; POSTERIOR MARGIN) - SEE ONCOLOGY TABLE AND COMMENT BELOW   11/01/2017 Oncotype testing   RS Score 2 Distant recurrence risk at 9 years 3% Group average absolute chemo benefit <1%   11/01/2017 Cancer Staging   Staging form: Breast, AJCC 8th Edition - Pathologic stage from 11/01/2017: Stage IA (pT2, pN0, cM0, G3, ER+, PR+, HER2-, Oncotype DX score: 2) - Signed by Malachy Mood, MD on 01/03/2018   12/12/2017 - 01/05/2018 Radiation Therapy   Daily radiation therapy with Dr. Roselind Messier Radiation treatment dates:   12/11/17 - 01/05/18   Site/dose:  Total of 50.05 Gy in 20 fractions 1. Breast, Left/ 40.05 Gy delivered in 15 fractions of 2.67 Gy 2. Boost/ 10 Gy delivered in 5 fractions of 2 Gy   Beams/energy:    1. 3D, Photon/ 6X 2. Isodose Plan/ 6X   Narrative: The patient tolerated radiation treatment relatively well. She reported cording to the axilla that resolved over several days. By the end of treatments, she reported mild fatigue and slight sensitivity to the breast. Nurse note reported nipple was red and sensitive, and skin slightly hyperpigmented with rash appearance.     03/2018 -  Anti-estrogen oral therapy   Tamoxifen 20mg  daily starting 03/2018. D/c on 08/08/18 due to vaginal discharge. Start Anastrozole in 08/2018.    07/18/2018 Survivorship   SCP per Santiago Glad, NP    11/08/2018 - 05/08/2020 Antibody Plan   Zometa for osteoporosis every 6 months for 2 years, 11/08/18-05/08/20  Discussed the use of AI scribe software for clinical note transcription with the patient, who gave verbal consent to proceed.  History of Present Illness   A 73 year old female with a history of breast cancer presents for a routine follow-up. She has been on anastrozole for the past five years and reports experiencing side effects such as reduced energy levels and weight gain. Despite these side effects, she maintains a healthy  lifestyle, including a balanced diet and regular exercise. She also reports having osteoporosis and is currently seeking further management from an endocrinologist.         All other systems were reviewed with the patient and are negative.  MEDICAL HISTORY:  Past Medical History:  Diagnosis Date   Breast cancer, left (HCC) 09/2017   Hypertension    Personal history of radiation therapy     SURGICAL HISTORY: Past Surgical History:  Procedure Laterality Date   BLEPHAROPLASTY     lower eyelid   BREAST LUMPECTOMY     BREAST LUMPECTOMY WITH AXILLARY LYMPH NODE BIOPSY Left 11/01/2017   Procedure: BREAST LUMPECTOMY WITH SENTINEL LYMPH NODE MAPPING;  Surgeon: Griselda Miner, MD;  Location: [facility];  Service: General;  Laterality: Left;    I have reviewed the social history and family history with the patient and they are unchanged from previous note.  ALLERGIES:  has no known allergies.  MEDICATIONS:  Current Outpatient Medications  Medication Sig Dispense Refill   ALPRAZolam (XANAX) 0.25 MG tablet Take 1 tablet (0.25 mg total) by mouth daily as needed for anxiety. 12 tablet 0   anastrozole (ARIMIDEX) 1 MG tablet TAKE 1 TABLET BY MOUTH EVERY DAY 90 tablet 3   atorvastatin (LIPITOR) 10 MG tablet TAKE 1 TABLET BY MOUTH EVERY DAY 90 tablet 1   losartan (COZAAR) 25 MG tablet TAKE 1 TABLET (25 MG TOTAL) BY MOUTH DAILY. 90 tablet 1   naproxen sodium (ALEVE) 220 MG tablet Take 220 mg by mouth daily as needed.     tretinoin (RETIN-A) 0.1 % cream Apply topically at bedtime. 45 g 1   vitamin C (ASCORBIC ACID) 500 MG tablet Take 500 mg by mouth daily.     No current facility-administered medications for this visit.    PHYSICAL EXAMINATION: ECOG PERFORMANCE STATUS: 0 - Asymptomatic  Vitals:   05/08/23 0903  BP: (!) 179/88  Pulse: 60  Resp: 16  Temp: (!) 97 F (36.1 C)  SpO2: 100%   Wt Readings from Last 3 Encounters:  05/08/23 123 lb 4.8 oz (55.9 kg)  12/20/22  118 lb 9.6 oz (53.8 kg)  11/03/22 119 lb 12.8 oz (54.3 kg)     GENERAL:alert, no distress and comfortable SKIN: skin color, texture, turgor are normal, no rashes or significant lesions EYES: normal, Conjunctiva are pink and non-injected, sclera clear NECK: supple, thyroid normal size, non-tender, without nodularity LYMPH:  no palpable lymphadenopathy in the cervical, axillary  LUNGS: clear to auscultation and percussion with normal breathing effort HEART: regular rate & rhythm and no murmurs and no lower extremity edema ABDOMEN:abdomen soft, non-tender and normal bowel sounds Musculoskeletal:no cyanosis of digits and no clubbing  NEURO: alert & oriented x 3 with fluent speech, no focal motor/sensory deficits Breast exam showed mild scar tissue in the left breast from previous incision, no palpable breast mass or adenopathy     LABORATORY DATA:  I have reviewed the data as listed    Latest Ref Rng & Units 05/08/2023    8:48 AM 11/03/2022  8:58 AM 05/06/2022    8:44 AM  CBC  WBC 4.0 - 10.5 K/uL 4.9  4.7  4.9   Hemoglobin 12.0 - 15.0 g/dL 16.1  09.6  04.5   Hematocrit 36.0 - 46.0 % 38.8  38.6  38.9   Platelets 150 - 400 K/uL 233  249  292         Latest Ref Rng & Units 05/08/2023    8:48 AM 11/03/2022    8:58 AM 05/06/2022    8:44 AM  CMP  Glucose 70 - 99 mg/dL 93  96  73   BUN 8 - 23 mg/dL 21  20  26    Creatinine 0.44 - 1.00 mg/dL 4.09  8.11  9.14   Sodium 135 - 145 mmol/L 138  138  142   Potassium 3.5 - 5.1 mmol/L 3.8  4.1  3.9   Chloride 98 - 111 mmol/L 106  104  109   CO2 22 - 32 mmol/L 27  28  28    Calcium 8.9 - 10.3 mg/dL 9.0  9.1  9.2   Total Protein 6.5 - 8.1 g/dL 6.9  7.1  7.2   Total Bilirubin 0.0 - 1.2 mg/dL 0.5  0.4  0.4   Alkaline Phos 38 - 126 U/L 50  54  65   AST 15 - 41 U/L 19  19  21    ALT 0 - 44 U/L 15  11  18        RADIOGRAPHIC STUDIES: I have personally reviewed the radiological images as listed and agreed with the findings in the report. No  results found.    Orders Placed This Encounter  Procedures   MM Digital Screening    Contrast enhanced MM    Standing Status:   Future    Expected Date:   11/08/2023    Expiration Date:   05/07/2024    Reason for Exam (SYMPTOM  OR DIAGNOSIS REQUIRED):   SCREENING    Preferred imaging [facility]?:   [facility]   Ambulatory referral to Endocrinology    Referral Priority:   Routine    Referral Type:   Consultation    Referral Reason:   Specialty Services Required    Number of Visits Requested:   1   All questions were answered. The patient knows to call the clinic with any problems, questions or concerns. No barriers to learning was detected. The total time spent in the appointment was 30 minutes.     Malachy Mood, MD 05/08/2023

## 2023-05-09 ENCOUNTER — Telehealth: Payer: Self-pay

## 2023-05-09 NOTE — Telephone Encounter (Signed)
 Per Dr. Georga Bora request the CMA successfully faxed Breast Cancer Index [phone]). Received confirmation of fax and placed original in the fax drawer.

## 2023-05-18 DIAGNOSIS — C50212 Malignant neoplasm of upper-inner quadrant of left female breast: Secondary | ICD-10-CM | POA: Diagnosis not present

## 2023-05-18 DIAGNOSIS — Z170 Estrogen receptor positive status [ER+]: Secondary | ICD-10-CM | POA: Diagnosis not present

## 2023-05-22 ENCOUNTER — Encounter (HOSPITAL_COMMUNITY): Payer: Self-pay

## 2023-05-29 ENCOUNTER — Telehealth: Payer: Self-pay

## 2023-05-29 NOTE — Telephone Encounter (Addendum)
  Called patient to relay message below as per Dr. Mosetta Putt, patient voiced full understanding and had no further questions.  ----- Message from Malachy Mood sent at 05/26/2023  6:48 PM EST ----- Please let pt know her breast cancer index result, she has no benefit to take anastrozole beyond 5 years, so she can stop after 09/18/23, thx   Malachy Mood

## 2023-07-10 ENCOUNTER — Other Ambulatory Visit

## 2023-07-12 ENCOUNTER — Ambulatory Visit (INDEPENDENT_AMBULATORY_CARE_PROVIDER_SITE_OTHER): Admitting: "Endocrinology

## 2023-07-12 ENCOUNTER — Encounter: Payer: Self-pay | Admitting: "Endocrinology

## 2023-07-12 VITALS — BP 136/86 | HR 65 | Ht 63.0 in | Wt 121.0 lb

## 2023-07-12 DIAGNOSIS — M810 Age-related osteoporosis without current pathological fracture: Secondary | ICD-10-CM

## 2023-07-12 NOTE — Progress Notes (Signed)
 [facility] Note Tammy Newcomer, MD    Referring Provider: Sonja , MD Primary Care Provider: Carey Chapman, MD No chief complaint on file.    Assessment & Plan  Diagnoses and all orders for this visit:  Age-related osteoporosis without current pathological fracture -     PTH, intact and calcium  -     Comprehensive metabolic panel with GFR -     VITAMIN D  25 Hydroxy (Vit-D Deficiency, Fractures)     Osteoporosis Likely secondary cause from age, including anastrozole  therapy.  BMD results suggest: T-score of -5.3 at left Forearm Radius 33%. Recommend to use calcium  600 mg twice daily and vitamin D  2000 units OTC supplements. Discussed weight bearing exercise options and dietary supplements. Will evaluate for secondary causes of osteoporosis.  Educated on risks and side effects of reclast /denosumab including worsening GERD, atypical femoral fractures and osteonecrosis of the jaw. Advised to take medication first thing in the morning with plenty of water and stay upright for 30 minutes after taking the medication.  Patient is interested in Prolia.  Has no active dental issues.  Will follow-up with dentist.  Labs today.  Advised fall precautions, adequate dairy in diet and exercises (aerobic, balancing and weight bearing) as tolerated.   Return in about 3 months (around 10/11/2023) for visit, labs today, labs before next visit.  I have reviewed current medications, nurse's notes, allergies, vital signs, past medical and surgical history, family medical history, and social history for this encounter. Counseled patient on symptoms, examination findings, lab findings, imaging results, treatment decisions and monitoring and prognosis. The patient understood the recommendations and agrees with the treatment plan. All questions regarding treatment plan were fully answered.   Tammy Newcomer, MD   07/12/23    History of Present Illness Tammy Levine is a 73 y.o. year old  female who presents to our clinic with osteoporosis diagnosed around 2021.  BMD completed on 10/20/22 suggested osteoporosis. She is  not on calcium  due to constipation and vitamin D  2000 international units once daily. On anastrozole  due to history of breast cancer. Her 03/2018 DEXA shows osteoporosis with lowest T-score of -3.6 at [facility]. Repeat on 10/15/20 showed slight improvement to -3.3, slightly improved. She received Zometa  infusion q6 months for 2 years, 11/08/18-05/08/20.   Risk Factors screening:  History of low trauma fractures: No Family history of osteoporosis: Yes Hip fracture in first-degree relatives: Yes Smoking history: No Excessive alcohol intake >2 drinks/day: No Excessive caffeine intake >2 drinks/day: No Glucocorticoid use >5mg  prednisone/day for >3 months: No Rheumatoid arthritis history: No Premature/Surgical Menopause: No    Anti-epileptic drugs No  Celiac disease/signs of malabsorption No  Gastric bypass/gastrectomy No  PPI use No  TZD use No  Gonadotropin/androgen suppressing drugs No  Aromatase Inhibitors No  Thyroid  hormone suppressive therapy No  10/20/22: The BMD measured at Forearm Radius 33% is 0.415 g/cm2 with a T-score of -5.3.  The lumbar spine was excluded dueto degenerative changes. DualFemur Neck Right 10/20/2022 71.5 -2.7 0.660 g/cm2 * DualFemur Neck Right 10/15/2020 69.5 -2.4 0.707 g/cm2 *   DualFemur Total Mean 10/20/2022 71.5 -2.7 0.663 g/cm2  Physical Exam  BP 136/86   Pulse 65   Ht 5\' 3"  (1.6 m)   Wt 121 lb (54.9 kg)   LMP 03/21/2002   SpO2 97%   BMI 21.43 kg/m  Constitutional: well developed, well nourished Head: normocephalic, atraumatic Eyes: sclera anicteric, no redness Neck: supple Lungs: normal respiratory effort Neurology: alert and oriented Skin:  dry, no appreciable rashes Musculoskeletal: no appreciable defects Psychiatric: normal mood and affect  Allergies No Known Allergies  Current Medications Patient's  Medications  New Prescriptions   No medications on file  Previous Medications   ALPRAZOLAM  (XANAX ) 0.25 MG TABLET    Take 1 tablet (0.25 mg total) by mouth daily as needed for anxiety.   ANASTROZOLE  (ARIMIDEX ) 1 MG TABLET    TAKE 1 TABLET BY MOUTH EVERY DAY   ATORVASTATIN  (LIPITOR) 10 MG TABLET    TAKE 1 TABLET BY MOUTH EVERY DAY   LOSARTAN  (COZAAR ) 25 MG TABLET    TAKE 1 TABLET (25 MG TOTAL) BY MOUTH DAILY.   NAPROXEN SODIUM (ALEVE) 220 MG TABLET    Take 220 mg by mouth daily as needed.   TRETINOIN  (RETIN-A ) 0.1 % CREAM    Apply topically at bedtime.   VITAMIN C (ASCORBIC ACID) 500 MG TABLET    Take 500 mg by mouth daily.  Modified Medications   No medications on file  Discontinued Medications   No medications on file     Past Medical History Past Medical History:  Diagnosis Date   Breast cancer, left (HCC) 09/2017   Hypertension    Personal history of radiation therapy     Past Surgical History Past Surgical History:  Procedure Laterality Date   BLEPHAROPLASTY     lower eyelid   BREAST LUMPECTOMY     BREAST LUMPECTOMY WITH AXILLARY LYMPH NODE BIOPSY Left 11/01/2017   Procedure: BREAST LUMPECTOMY WITH SENTINEL LYMPH NODE MAPPING;  Surgeon: Caralyn Chandler, MD;  Location: [facility];  Service: General;  Laterality: Left;    Family History family history includes Breast cancer in her cousin; Breast cancer (age of onset: 47) in her mother; Chronic Renal Failure in her father and mother.  Social History Social History   Socioeconomic History   Marital status: Single    Spouse name: Not on file   Number of children: Not on file   Years of education: Not on file   Highest education level: Bachelor's degree (e.g., BA, AB, BS)  Occupational History   Not on file  Tobacco Use   Smoking status: Never   Smokeless tobacco: Never  Vaping Use   Vaping status: Never Used  Substance and Sexual Activity   Alcohol use: Yes    Comment: occasionally   Drug use:  Never   Sexual activity: Not Currently    Birth control/protection: Post-menopausal    Comment: 1st intercourse 73 yo-more than 5 partners  Other Topics Concern   Not on file  Social History Narrative   Not on file   Social Drivers of Health   Financial Resource Strain: Low Risk  (12/13/2022)   Overall Financial Resource Strain (CARDIA)    Difficulty of Paying Living Expenses: Not hard at all  Food Insecurity: No Food Insecurity (12/13/2022)   Hunger Vital Sign    Worried About Running Out of Food in the Last Year: Never true    Ran Out of Food in the Last Year: Never true  Transportation Needs: No Transportation Needs (12/13/2022)   PRAPARE - Administrator, Civil Service (Medical): No    Lack of Transportation (Non-Medical): No  Physical Activity: Sufficiently Active (12/13/2022)   Exercise Vital Sign    Days of Exercise per Week: 7 days    Minutes of Exercise per Session: 60 min  Stress: No Stress Concern Present (12/13/2022)   [employer] of Occupational Health - Occupational  Stress Questionnaire    Feeling of Stress : Not at all  Social Connections: Unknown (12/13/2022)   Social Connection and Isolation Panel [NHANES]    Frequency of Communication with Friends and Family: Three times a week    Frequency of Social Gatherings with Friends and Family: Three times a week    Attends Religious Services: Patient declined    Active Member of Clubs or Organizations: Yes    Attends Banker Meetings: Patient declined    Marital Status: Patient declined  Intimate Partner Violence: Not on file    Laboratory Investigations No components found for: "CMP" No components found for: "BMP" No results found for: "GFR" Lab Results  Component Value Date   CREATININE 0.78 05/08/2023   No results found for: "CBC" No components found for: "LFT" No components found for: "VITD" No results found for: "PTH"  Lab Results  Component Value Date   TSH 4.380  12/15/2020    No components found for: "RENAL FUNCTION" No components found for: "MAGNESIUM"  Parts of this note may have been dictated using voice recognition software. There may be variances in spelling and vocabulary which are unintentional. Not all errors are proofread. Please notify the [facility] if any discrepancies are noted or if the meaning of any statement is not clear.

## 2023-07-13 LAB — COMPREHENSIVE METABOLIC PANEL WITH GFR
AG Ratio: 1.5 (calc) (ref 1.0–2.5)
ALT: 15 U/L (ref 6–29)
AST: 20 U/L (ref 10–35)
Albumin: 4.3 g/dL (ref 3.6–5.1)
Alkaline phosphatase (APISO): 56 U/L (ref 37–153)
BUN: 21 mg/dL (ref 7–25)
CO2: 28 mmol/L (ref 20–32)
Calcium: 9.6 mg/dL (ref 8.6–10.4)
Chloride: 104 mmol/L (ref 98–110)
Creat: 0.87 mg/dL (ref 0.60–1.00)
Globulin: 2.9 g/dL (ref 1.9–3.7)
Glucose, Bld: 88 mg/dL (ref 65–99)
Potassium: 4.3 mmol/L (ref 3.5–5.3)
Sodium: 137 mmol/L (ref 135–146)
Total Bilirubin: 0.5 mg/dL (ref 0.2–1.2)
Total Protein: 7.2 g/dL (ref 6.1–8.1)
eGFR: 71 mL/min/{1.73_m2} (ref >=60)

## 2023-07-13 LAB — PTH, INTACT AND CALCIUM
Calcium: 9.6 mg/dL (ref 8.6–10.4)
PTH: 36 pg/mL (ref 8.6–77)

## 2023-07-13 LAB — VITAMIN D 25 HYDROXY (VIT D DEFICIENCY, FRACTURES): Vit D, 25-Hydroxy: 30 ng/mL (ref 30–100)

## 2023-07-17 ENCOUNTER — Encounter: Payer: Self-pay | Admitting: "Endocrinology

## 2023-07-18 ENCOUNTER — Telehealth: Payer: Self-pay

## 2023-07-18 NOTE — Telephone Encounter (Signed)
 Pt is starting on prolia, please let office if pt insurance approves prolia or not.

## 2023-07-19 NOTE — Telephone Encounter (Signed)
Prolia VOB initiated via [email or web address]  Next Prolia inj DUE: new start

## 2023-07-23 NOTE — Telephone Encounter (Signed)
 Patient is ready for scheduling on or after 07/23/23 BUY AND BILL  Out-of-pocket cost due at time of visit: $356.87  Primary:  Medicare Prolia co-insurance: 20% (approximately $331.87) Admin fee co-insurance: 20% (approximately $25)  Deductible: $212.85 of $527 met, must be met for coverage to apply  Prior Auth: NOT required  Secondary: N/A Prolia co-insurance:  Admin fee co-insurance:  Deductible:  Prior Auth:  PA# Valid:   ** This summary of benefits is an estimation of the patient's out-of-pocket cost. Exact cost may vary based on individual plan coverage.

## 2023-07-23 NOTE — Telephone Encounter (Signed)
 Prior Authorization NOT required for [facility]

## 2023-07-28 ENCOUNTER — Ambulatory Visit

## 2023-07-28 DIAGNOSIS — M810 Age-related osteoporosis without current pathological fracture: Secondary | ICD-10-CM | POA: Diagnosis not present

## 2023-07-28 MED ORDER — DENOSUMAB 60 MG/ML ~~LOC~~ SOSY
60.0000 mg | PREFILLED_SYRINGE | Freq: Once | SUBCUTANEOUS | Status: AC
  Administered 2024-02-09: 60 mg via SUBCUTANEOUS

## 2023-07-28 MED ORDER — DENOSUMAB 60 MG/ML ~~LOC~~ SOSY
60.0000 mg | PREFILLED_SYRINGE | Freq: Once | SUBCUTANEOUS | Status: AC
  Administered 2023-07-28: 60 mg via SUBCUTANEOUS

## 2023-07-28 NOTE — Progress Notes (Signed)
 Pt was in office today for first prolia injection given in the right arm with no problem.

## 2023-07-31 NOTE — Telephone Encounter (Signed)
 Last Prolia inj 07/28/23 Next Prolia inj due 01/29/24

## 2023-09-10 ENCOUNTER — Other Ambulatory Visit: Payer: Self-pay | Admitting: Family Medicine

## 2023-09-10 DIAGNOSIS — E785 Hyperlipidemia, unspecified: Secondary | ICD-10-CM

## 2023-09-10 DIAGNOSIS — I10 Essential (primary) hypertension: Secondary | ICD-10-CM

## 2023-09-11 NOTE — Telephone Encounter (Signed)
 Chart reviewed. Rx refilled.

## 2023-10-09 ENCOUNTER — Other Ambulatory Visit

## 2023-10-12 ENCOUNTER — Ambulatory Visit: Admitting: "Endocrinology

## 2023-10-16 ENCOUNTER — Ambulatory Visit: Admitting: "Endocrinology

## 2023-10-23 ENCOUNTER — Ambulatory Visit

## 2023-11-01 ENCOUNTER — Telehealth: Payer: Self-pay

## 2023-11-01 ENCOUNTER — Other Ambulatory Visit: Payer: Self-pay

## 2023-11-01 ENCOUNTER — Other Ambulatory Visit: Payer: Self-pay | Admitting: Hematology

## 2023-11-01 DIAGNOSIS — Z853 Personal history of malignant neoplasm of breast: Secondary | ICD-10-CM

## 2023-11-01 NOTE — Telephone Encounter (Signed)
 Tried contacting pt to inform pt that she is scheduled for enhanced contrast mammogram on 11/02/2023 @1230  at [facility].  Unable to reach pt and no voicemail available to leave a message.  Contacted pt's contact Dorothyann Kiang (listed in pt's chart as a contact) and was unable to reach her as well.  LVM for Dorothyann to remind pt that she have an appt at [facility] on 11/02/2023 @1230  and to give Dr. Demetra office a call if further discussion is needed.

## 2023-11-02 ENCOUNTER — Other Ambulatory Visit

## 2023-11-02 ENCOUNTER — Ambulatory Visit
Admission: RE | Admit: 2023-11-02 | Discharge: 2023-11-02 | Disposition: A | Discharge status: Home / Self Care | Source: Ambulatory Visit | Attending: Hematology | Admitting: Hematology

## 2023-11-02 ENCOUNTER — Other Ambulatory Visit: Payer: Self-pay | Admitting: "Endocrinology

## 2023-11-02 DIAGNOSIS — R92343 Mammographic extreme density, bilateral breasts: Secondary | ICD-10-CM | POA: Diagnosis not present

## 2023-11-02 DIAGNOSIS — Z853 Personal history of malignant neoplasm of breast: Secondary | ICD-10-CM

## 2023-11-02 DIAGNOSIS — M810 Age-related osteoporosis without current pathological fracture: Secondary | ICD-10-CM

## 2023-11-02 DIAGNOSIS — R928 Other abnormal and inconclusive findings on diagnostic imaging of breast: Secondary | ICD-10-CM | POA: Diagnosis not present

## 2023-11-02 MED ORDER — IOPAMIDOL (ISOVUE-370) INJECTION 76%
100.0000 mL | Freq: Once | INTRAVENOUS | Status: AC | PRN
  Administered 2023-11-02: 100 mL via INTRAVENOUS

## 2023-11-03 LAB — RENAL FUNCTION PANEL
Albumin: 4.1 g/dL (ref 3.6–5.1)
BUN/Creatinine Ratio: 51 (calc) — ABNORMAL HIGH (ref 6–22)
BUN: 29 mg/dL — ABNORMAL HIGH (ref 7–25)
CO2: 25 mmol/L (ref 20–32)
Calcium: 9.1 mg/dL (ref 8.6–10.4)
Chloride: 106 mmol/L (ref 98–110)
Creat: 0.57 mg/dL — ABNORMAL LOW (ref 0.60–1.00)
Glucose, Bld: 93 mg/dL (ref 65–99)
Phosphorus: 3.9 mg/dL (ref 2.1–4.3)
Potassium: 3.9 mmol/L (ref 3.5–5.3)
Sodium: 138 mmol/L (ref 135–146)

## 2023-11-03 LAB — VITAMIN D 25 HYDROXY (VIT D DEFICIENCY, FRACTURES): Vit D, 25-Hydroxy: 52 ng/mL (ref 30–100)

## 2023-11-09 ENCOUNTER — Encounter: Payer: Self-pay | Admitting: "Endocrinology

## 2023-11-09 ENCOUNTER — Ambulatory Visit (INDEPENDENT_AMBULATORY_CARE_PROVIDER_SITE_OTHER): Admitting: "Endocrinology

## 2023-11-09 VITALS — BP 122/80 | HR 84 | Ht 63.0 in | Wt 121.0 lb

## 2023-11-09 DIAGNOSIS — M810 Age-related osteoporosis without current pathological fracture: Secondary | ICD-10-CM

## 2023-11-09 NOTE — Progress Notes (Signed)
 [facility] Note Obadiah Birmingham, MD    Referring Provider: Diona Perkins, MD Primary Care Provider: Diona Perkins, MD No chief complaint on file.  Assessment & Plan  Diagnoses and all orders for this visit:  Age-related osteoporosis without current pathological fracture   Osteoporosis Likely secondary cause from age, including anastrozole  therapy.  BMD results suggest: T-score of -5.3 at left Forearm Radius 33%. Recommend to use calcium  600 mg twice daily and vitamin D  2000 units OTC supplements. Discussed weight bearing exercise options and dietary supplements.   Educated on risks and side effects of reclast /denosumab  including worsening GERD, atypical femoral fractures and osteonecrosis of the jaw.  S/p prolia  first shot on 07/28/23-well tolerated. Has no active dental issues. Will follow-up with dentist.   Advised fall precautions, adequate dairy in diet and exercises (aerobic, balancing and weight bearing) as tolerated.   Return in about 1 year (around 11/08/2024) for visit + labs before next visit.  I have reviewed current medications, nurse's notes, allergies, vital signs, past medical and surgical history, family medical history, and social history for this encounter. Counseled patient on symptoms, examination findings, lab findings, imaging results, treatment decisions and monitoring and prognosis. The patient understood the recommendations and agrees with the treatment plan. All questions regarding treatment plan were fully answered.   Obadiah Birmingham, MD   11/09/23   History of Present Illness Tammy Levine is a 73 y.o. year old female who presents to our clinic with osteoporosis diagnosed around 2021.  BMD completed on 10/20/22 suggested osteoporosis. She is not on calcium  due to constipation and vitamin D  2000 international units once daily. On anastrozole  due to history of breast cancer. Her 03/2018 DEXA shows osteoporosis with lowest T-score of -3.6 at [facility].  Repeat on 10/15/20 showed slight improvement to -3.3, slightly improved. She received Zometa  infusion q6 months for 2 years, 11/08/18-05/08/20.   On calcium  600mg  bid, does milk/yogurt  On Vit D about 2000 international units bid  On MVI No falls/fracture S/p prolia  first shot on 07/28/23-well tolerated Goes to the gym, walks a lot, stretched, weights   Prior history:   Risk Factors screening:  History of low trauma fractures: No Family history of osteoporosis: Yes Hip fracture in first-degree relatives: Yes Smoking history: No Excessive alcohol intake >2 drinks/day: No Excessive caffeine intake >2 drinks/day: No Glucocorticoid use >5mg  prednisone/day for >3 months: No Rheumatoid arthritis history: No Premature/Surgical Menopause: No    Anti-epileptic drugs No  Celiac disease/signs of malabsorption No  Gastric bypass/gastrectomy No  PPI use No  TZD use No  Gonadotropin/androgen suppressing drugs No  Aromatase Inhibitors No  Thyroid  hormone suppressive therapy No  10/20/22: The BMD measured at Forearm Radius 33% is 0.415 g/cm2 with a T-score of -5.3.  The lumbar spine was excluded dueto degenerative changes. DualFemur Neck Right 10/20/2022 71.5 -2.7 0.660 g/cm2 * DualFemur Neck Right 10/15/2020 69.5 -2.4 0.707 g/cm2 *   DualFemur Total Mean 10/20/2022 71.5 -2.7 0.663 g/cm2  Physical Exam  BP 122/80   Pulse 84   Ht 5' 3 (1.6 m)   Wt 121 lb (54.9 kg)   LMP 03/21/2002   SpO2 96%   BMI 21.43 kg/m  Constitutional: well developed, well nourished Head: normocephalic, atraumatic Eyes: sclera anicteric, no redness Neck: supple Lungs: normal respiratory effort Neurology: alert and oriented Skin: dry, no appreciable rashes Musculoskeletal: no appreciable defects Psychiatric: normal mood and affect  Allergies No Known Allergies  Current Medications Patient's Medications  New Prescriptions  No medications on file  Previous Medications   ALPRAZOLAM  (XANAX ) 0.25 MG  TABLET    Take 1 tablet (0.25 mg total) by mouth daily as needed for anxiety.   ANASTROZOLE  (ARIMIDEX ) 1 MG TABLET    TAKE 1 TABLET BY MOUTH EVERY DAY   ATORVASTATIN  (LIPITOR) 10 MG TABLET    TAKE 1 TABLET BY MOUTH EVERY DAY   LOSARTAN  (COZAAR ) 25 MG TABLET    TAKE 1 TABLET (25 MG TOTAL) BY MOUTH DAILY.   NAPROXEN SODIUM (ALEVE) 220 MG TABLET    Take 220 mg by mouth daily as needed.   TRETINOIN  (RETIN-A ) 0.1 % CREAM    Apply topically at bedtime.   VITAMIN C (ASCORBIC ACID) 500 MG TABLET    Take 500 mg by mouth daily.  Modified Medications   No medications on file  Discontinued Medications   No medications on file     Past Medical History Past Medical History:  Diagnosis Date   Breast cancer, left (HCC) 09/2017   Hypertension    Personal history of radiation therapy     Past Surgical History Past Surgical History:  Procedure Laterality Date   BLEPHAROPLASTY     lower eyelid   BREAST LUMPECTOMY     BREAST LUMPECTOMY WITH AXILLARY LYMPH NODE BIOPSY Left 11/01/2017   Procedure: BREAST LUMPECTOMY WITH SENTINEL LYMPH NODE MAPPING;  Surgeon: Curvin Deward MOULD, MD;  Location: [facility];  Service: General;  Laterality: Left;    Family History family history includes Breast cancer in her cousin; Breast cancer (age of onset: 60) in her mother; Chronic Renal Failure in her father and mother.  Social History Social History   Socioeconomic History   Marital status: Single    Spouse name: Not on file   Number of children: Not on file   Years of education: Not on file   Highest education level: Bachelor's degree (e.g., BA, AB, BS)  Occupational History   Not on file  Tobacco Use   Smoking status: Never   Smokeless tobacco: Never  Vaping Use   Vaping status: Never Used  Substance and Sexual Activity   Alcohol use: Yes    Comment: occasionally   Drug use: Never   Sexual activity: Not Currently    Birth control/protection: Post-menopausal    Comment: 1st intercourse  73 yo-more than 5 partners  Other Topics Concern   Not on file  Social History Narrative   Not on file   Social Drivers of Health   Financial Resource Strain: Low Risk  (12/13/2022)   Overall Financial Resource Strain (CARDIA)    Difficulty of Paying Living Expenses: Not hard at all  Food Insecurity: No Food Insecurity (12/13/2022)   Hunger Vital Sign    Worried About Running Out of Food in the Last Year: Never true    Ran Out of Food in the Last Year: Never true  Transportation Needs: No Transportation Needs (12/13/2022)   PRAPARE - Administrator, Civil Service (Medical): No    Lack of Transportation (Non-Medical): No  Physical Activity: Sufficiently Active (12/13/2022)   Exercise Vital Sign    Days of Exercise per Week: 7 days    Minutes of Exercise per Session: 60 min  Stress: No Stress Concern Present (12/13/2022)   [employer] of Occupational Health - Occupational Stress Questionnaire    Feeling of Stress : Not at all  Social Connections: Unknown (12/13/2022)   Social Connection and Isolation Panel    Frequency  of Communication with Friends and Family: Three times a week    Frequency of Social Gatherings with Friends and Family: Three times a week    Attends Religious Services: Patient declined    Active Member of Clubs or Organizations: Yes    Attends Banker Meetings: Patient declined    Marital Status: Patient declined  Intimate Partner Violence: Not on file    Laboratory Investigations No components found for: CMP No components found for: BMP No results found for: GFR Lab Results  Component Value Date   CREATININE 0.57 (L) 11/02/2023   No results found for: CBC No components found for: LFT No components found for: VITD Lab Results  Component Value Date   PTH 36 07/12/2023    Lab Results  Component Value Date   TSH 4.380 12/15/2020    No components found for: RENAL FUNCTION No components found for:  MAGNESIUM  Parts of this note may have been dictated using voice recognition software. There may be variances in spelling and vocabulary which are unintentional. Not all errors are proofread. Please notify the dino if any discrepancies are noted or if the meaning of any statement is not clear.

## 2023-11-29 DIAGNOSIS — Z23 Encounter for immunization: Secondary | ICD-10-CM | POA: Diagnosis not present

## 2023-12-18 DIAGNOSIS — Z23 Encounter for immunization: Secondary | ICD-10-CM | POA: Diagnosis not present

## 2024-01-07 NOTE — Telephone Encounter (Signed)
 Prolia  VOB initiated via [email or web address]  Next Prolia  inj DUE: 01/29/24

## 2024-01-16 ENCOUNTER — Encounter: Payer: Self-pay | Admitting: Family Medicine

## 2024-01-16 ENCOUNTER — Telehealth: Payer: Self-pay | Admitting: Family Medicine

## 2024-01-16 ENCOUNTER — Ambulatory Visit: Admitting: Family Medicine

## 2024-01-16 VITALS — BP 155/93 | HR 62 | Ht 63.0 in | Wt 123.8 lb

## 2024-01-16 DIAGNOSIS — H9113 Presbycusis, bilateral: Secondary | ICD-10-CM | POA: Diagnosis present

## 2024-01-16 DIAGNOSIS — I10 Essential (primary) hypertension: Secondary | ICD-10-CM | POA: Diagnosis not present

## 2024-01-16 NOTE — Progress Notes (Signed)
    SUBJECTIVE:   CHIEF COMPLAINT / HPI:   Hearing concern Worsening hearing over the last few years Denies tinnitus Requests referral to audiology  HTN Takes BP meds daily and states that blood pressure is normally well-controlled States BP readings on automated cuffs tend to be higher vs manual BP at endocrinology appt 8/21 was 122/80  Healthcare maintenance  Does stool testing yearly instead of colonoscopy  PERTINENT  PMH / PSH: HTN, Hashimoto's, breast cancer  OBJECTIVE:   BP (!) 155/93   Pulse 62   Ht 5' 3 (1.6 m)   Wt 123 lb 12.8 oz (56.2 kg)   LMP 03/21/2002   SpO2 99%   BMI 21.93 kg/m   - General: No acute distress. Awake and conversant. - Eyes: Normal conjunctiva, anicteric. Round symmetric pupils. - ENT: Hearing grossly intact. No nasal discharge. - Respiratory: Respirations are non-labored. - Skin: No visible rashes or ulcers. - Psych: Alert and oriented. Cooperative, Appropriate mood and affect, Normal judgment. - Neuro: Sensation and CN II-XII grossly normal.   ASSESSMENT/PLAN:   Assessment & Plan Presbycusis of both ears Gradual hearing loss over the past few years. Referring to audiology for workup - Ambulatory referral to Audiology  Essential hypertension Endorses good adherence with blood pressure medication.  Advised checking blood pressure at home and scheduling follow-up in 1 month.     Rea Raring, MD [facility]

## 2024-01-16 NOTE — Assessment & Plan Note (Signed)
 Endorses good adherence with blood pressure medication.  Advised checking blood pressure at home and scheduling follow-up in 1 month.

## 2024-01-16 NOTE — Patient Instructions (Addendum)
 Thank you for coming in today! Here is a summary of what we discussed:  -Please check your blood pressure at home and keep a record for your next appointment. Please schedule an appt with your PCP in 1 month.  -I sent a referral to audiology. You should get a call from them in the next 1-2 weeks. Please let us  know if you don't hear from them  Please call the clinic at [phone] if your symptoms worsen or you have any concerns.  Best, Dr Adele

## 2024-01-16 NOTE — Telephone Encounter (Signed)
 Called patient with Dr. Madelon Patient requested PCP switch. Had not seen Dr. Diona in a while. Patient saw Dr. Adele today and developed a good rapport. She requested a switch, and Dr. Adele agreed with the plan.   I called and spoke with the patient, advising her that she'd be allowed only one PCP switch and no allowance for this in the future, and she agreed with the plan.  Dr.Yang is aware of the switch.

## 2024-01-17 NOTE — Telephone Encounter (Signed)
 [employer] St[employer] - Prior Authorization NOT required for [employer]

## 2024-01-29 NOTE — Telephone Encounter (Signed)
 [employer]  Patient is ready for scheduling on or after 01/29/24  Out-of-pocket cost due at time of visit: $377.56  Primary:   Medicare Prolia  co-insurance: 20% (approximately $352.56) Admin fee co-insurance: 20% (approximately $25)  Deductible: $257 of $257 met  Prior Auth: NOT required  Secondary: N/A Prolia  co-insurance:  Admin fee co-insurance:  Deductible:  Prior Auth:  PA# Valid:   ** This summary of benefits is an estimation of the patient's out-of-pocket cost. Exact cost may vary based on individual plan coverage.

## 2024-02-02 NOTE — Telephone Encounter (Signed)
 ATCx1 to schedule Prolia .

## 2024-02-09 ENCOUNTER — Ambulatory Visit

## 2024-02-09 VITALS — Ht 63.0 in | Wt 123.0 lb

## 2024-02-09 DIAGNOSIS — M810 Age-related osteoporosis without current pathological fracture: Secondary | ICD-10-CM

## 2024-02-09 MED ORDER — DENOSUMAB 60 MG/ML ~~LOC~~ SOSY: 60.0000 mg | PREFILLED_SYRINGE | Freq: Once | SUBCUTANEOUS | Status: AC

## 2024-02-09 NOTE — Progress Notes (Unsigned)
 Pt was in office today for proila injectione given in the  Right  arm with no problem. Advise pt to wait in the lobby 30 mins in case of a reaction.

## 2024-02-16 NOTE — Telephone Encounter (Signed)
 Last Prolia  inj 02/09/24 Next Prolia  inj due 08/09/24

## 2024-02-21 ENCOUNTER — Ambulatory Visit: Admitting: Audiologist

## 2024-03-10 ENCOUNTER — Other Ambulatory Visit: Payer: Self-pay | Admitting: Family Medicine

## 2024-03-10 DIAGNOSIS — E785 Hyperlipidemia, unspecified: Secondary | ICD-10-CM

## 2024-03-10 DIAGNOSIS — I10 Essential (primary) hypertension: Secondary | ICD-10-CM

## 2024-04-06 ENCOUNTER — Encounter: Payer: Self-pay | Admitting: Hematology

## 2024-05-06 ENCOUNTER — Ambulatory Visit: Payer: Medicare Other | Admitting: Nurse Practitioner

## 2024-05-06 ENCOUNTER — Inpatient Hospital Stay

## 2024-05-06 ENCOUNTER — Other Ambulatory Visit: Payer: Medicare Other

## 2024-05-06 ENCOUNTER — Inpatient Hospital Stay: Admitting: Nurse Practitioner

## 2024-08-09 ENCOUNTER — Ambulatory Visit
